# Patient Record
Sex: Male | Born: 1960 | Race: Black or African American | Hispanic: No
Health system: Midwestern US, Community
[De-identification: ages and names within clinical notes are randomized; demographics above are authoritative.]

## PROBLEM LIST (undated history)

## (undated) ENCOUNTER — Emergency Department (HOSPITAL_COMMUNITY): Admission: EM | Payer: Medicare HMO

## (undated) DIAGNOSIS — R1032 Left lower quadrant pain: Secondary | ICD-10-CM

## (undated) DIAGNOSIS — R109 Unspecified abdominal pain: Secondary | ICD-10-CM

## (undated) DIAGNOSIS — M79604 Pain in right leg: Secondary | ICD-10-CM

## (undated) DIAGNOSIS — F32A Depression, unspecified: Secondary | ICD-10-CM

## (undated) DIAGNOSIS — F419 Anxiety disorder, unspecified: Secondary | ICD-10-CM

## (undated) DIAGNOSIS — L299 Pruritus, unspecified: Secondary | ICD-10-CM

## (undated) DIAGNOSIS — M1990 Unspecified osteoarthritis, unspecified site: Secondary | ICD-10-CM

## (undated) DIAGNOSIS — J45909 Unspecified asthma, uncomplicated: Secondary | ICD-10-CM

## (undated) DIAGNOSIS — F329 Major depressive disorder, single episode, unspecified: Secondary | ICD-10-CM

## (undated) DIAGNOSIS — M79676 Pain in unspecified toe(s): Secondary | ICD-10-CM

## (undated) DIAGNOSIS — S83249A Other tear of medial meniscus, current injury, unspecified knee, initial encounter: Secondary | ICD-10-CM

## (undated) DIAGNOSIS — B351 Tinea unguium: Secondary | ICD-10-CM

## (undated) DIAGNOSIS — F17200 Nicotine dependence, unspecified, uncomplicated: Secondary | ICD-10-CM

## (undated) HISTORY — DX: Nicotine dependence, unspecified, uncomplicated: F17.200

## (undated) HISTORY — DX: Pruritus, unspecified: L29.9

## (undated) HISTORY — DX: Pain in unspecified toe(s): M79.676

## (undated) HISTORY — DX: Tinea unguium: B35.1

## (undated) HISTORY — PX: HERNIA REPAIR: SHX51

## (undated) HISTORY — DX: Unspecified asthma, uncomplicated: J45.909

---
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
## 2015-01-14 ENCOUNTER — Inpatient Hospital Stay: Admit: 2015-01-14

## 2015-01-16 LAB — PSA, DIAGNOSTIC (PROSTATE SPECIFIC AG): Prostate Specific Ag: 0.5 ng/mL (ref 0.0–4.0)

## 2015-11-22 ENCOUNTER — Emergency Department

## 2015-11-22 ENCOUNTER — Inpatient Hospital Stay
Admit: 2015-11-22 | Discharge: 2015-11-22 | Disposition: A | Discharge status: Home / Self Care | Payer: MEDICARE | Attending: Emergency Medicine

## 2015-11-22 ENCOUNTER — Emergency Department: Admit: 2015-11-22 | Discharge: 2015-11-22 | Payer: MEDICARE

## 2015-11-22 DIAGNOSIS — M25561 Pain in right knee: Secondary | ICD-10-CM

## 2015-11-22 MED ORDER — OXYCODONE-ACETAMINOPHEN 5 MG-325 MG TAB
5-325 mg | ORAL_TABLET | Freq: Four times a day (QID) | ORAL | 0 refills | Status: DC | PRN
Start: 2015-11-22 — End: 2016-06-07

## 2015-11-22 NOTE — ED Provider Notes (Signed)
HPI Comments: Jeffrey Ibarra, 55 y.o. male with no significant PMHx, presents ambulatory to [facility] ED with cc of right knee pain x 1 week. Pt believes he had an insect bite earlier as it is swollen and red. Pt states the pain has since migrated towards the posterior side of the knee. Pt notes the pain is exacerbated with movement specifically bending and walking. Pt denies any recent injury. Patient specifically denies fever, chills, nausea, vomiting, chest pain, cough, or SOB.     PCP: None    Social history significant for: + Tobacco, - EtOH, - Illicit Drug Use    There are no other complaints, changes, or physical findings at this time.     The history is provided by the patient. No language interpreter was used.        No past medical history on file.    No past surgical history on file.      No family history on file.    Social History     Social History   ??? Marital status: SINGLE     Spouse name: N/A   ??? Number of children: N/A   ??? Years of education: N/A     Occupational History   ??? Not on file.     Social History Main Topics   ??? Smoking status: Not on file   ??? Smokeless tobacco: Not on file   ??? Alcohol use Not on file   ??? Drug use: Not on file   ??? Sexual activity: Not on file     Other Topics Concern   ??? Not on file     Social History Narrative         ALLERGIES: Bactrim [sulfamethoprim]    Review of Systems   Constitutional: Negative for chills and fever.   HENT: Negative for congestion, rhinorrhea and sore throat.    Respiratory: Negative for cough and shortness of breath.    Cardiovascular: Negative for chest pain and palpitations.   Gastrointestinal: Negative for abdominal pain, diarrhea, nausea and vomiting.   Genitourinary: Negative for dysuria and hematuria.   Musculoskeletal: Positive for arthralgias. Negative for back pain, neck pain and neck stiffness.   Skin: Negative for rash and wound.   Neurological: Negative for dizziness and headaches.    Psychiatric/Behavioral: Negative for agitation and confusion.       Vitals:    11/22/15 1338   BP: 139/89   Pulse: 68   Resp: 16   Temp: 97.6 ??F (36.4 ??C)   SpO2: 99%   Weight: 92.8 kg (204 lb 9.4 oz)   Height: 5\' 11"  (1.803 m)            Physical Exam   Constitutional: He is oriented to person, place, and time. He appears well-developed and well-nourished. No distress.   HENT:   Head: Normocephalic and atraumatic.   Right Ear: External ear normal.   Left Ear: External ear normal.   Nose: Nose normal.   Mouth/Throat: Oropharynx is clear and moist. No oropharyngeal exudate.   Eyes: Conjunctivae and EOM are normal. Pupils are equal, round, and reactive to light. Right eye exhibits no discharge. Left eye exhibits no discharge. No scleral icterus.   Neck: Normal range of motion. Neck supple. No JVD present. No tracheal deviation present.   Cardiovascular: Normal rate, regular rhythm, normal heart sounds and intact distal pulses.  Exam reveals no gallop and no friction rub.    No murmur heard.  Pulmonary/Chest: Effort normal and  breath sounds normal. No respiratory distress. He has no wheezes. He has no rales. He exhibits no tenderness.   Abdominal: Soft. Bowel sounds are normal. He exhibits no distension and no mass. There is no tenderness. There is no rebound and no guarding.   Musculoskeletal: He exhibits no edema.        Right knee: He exhibits decreased range of motion, swelling and erythema. He exhibits no deformity. Tenderness found.   Lymphadenopathy:     He has no cervical adenopathy.   Neurological: He is alert and oriented to person, place, and time. He has normal reflexes. No cranial nerve deficit. He exhibits normal muscle tone. Coordination normal.   Skin: Skin is warm and dry. He is not diaphoretic.   Psychiatric: He has a normal mood and affect. His behavior is normal. Judgment and thought content normal.   Nursing note and vitals reviewed.       MDM  Number of Diagnoses or Management Options   Diagnosis management comments:     DDx: sprain, strain, insect bite, baker's cyst       Amount and/or Complexity of Data Reviewed  Tests in the radiology section of CPT??: ordered and reviewed  Review and summarize past medical records: yes    Patient Progress  Patient progress: stable    ED Course       Procedures    IMAGING RESULTS:  DUPLEX LOWER EXT VENOUS RIGHT   Final Result   INDICATION:  Right leg swelling and pain, assess for DVT.  ??  COMPARISON:  No old study.  ??  FINDINGS:  Duplex venous Doppler examination was performed from the right  inguinal ligament to the proximal calf.    Deep venous structures were well imaged and appeared compressible throughout.    Both wave form and color flow analysis demonstrated normal flow patterns.    Augmentation was demonstrable.    ??  ??  IMPRESSION  IMPRESSION:  No deep venous thrombosis detected.  ??     IMPRESSION:  1. Leg pain, central, right    2. Acute pain of right knee        PLAN:  1.   Current Discharge Medication List      START taking these medications    Details   oxyCODONE-acetaminophen (PERCOCET) 5-325 mg per tablet Take 1 Tab by mouth every six (6) hours as needed for Pain. Max Daily Amount: 4 Tabs.  Qty: 10 Tab, Refills: 0           2.   Follow-up Information     Follow up With Details Comments Contact Info    Janece Canterburyobert D Wills, MD In 2 days As needed [address]  [address]  [place] [id number]  [phone]          Return to ED if worse     Discharge Note:  3:23 PM   The pt is ready for discharge. The pt's signs, symptoms, diagnosis, and discharge instructions have been discussed and pt has conveyed their understanding. The pt is to follow up as recommended or return to ER should their symptoms worsen. Plan has been discussed and pt is in agreement.    This note is prepared by Viann ShoveSarah Valkovci, acting as a Neurosurgeoncribe for [employer] EnergyPA-C Naveah Brave.    PA-C Greig CastillaAndrew Vani Gunner: The scribe's documentation has been prepared  under my direction and personally reviewed by me in its entirety. I confirm that the notes above accurately reflects all work, treatment, procedures,  and medical decision making performed by me.

## 2015-11-22 NOTE — ED Notes (Signed)
Pt received discharge instructions from PA and verbalized understanding. Pt ambulatory to exit.

## 2015-11-22 NOTE — ED Notes (Signed)
The patient was given discharge instructions and questions were answered. Patient is in no apparent distress at time of discharge.   Patient ambulatory with steady gait, knee immobilizer in place.

## 2015-11-22 NOTE — ED Notes (Signed)
Patient resting in bed in no apparent distress. Call bell in reach, bed in low locked position.

## 2015-12-20 ENCOUNTER — Emergency Department: Admit: 2015-12-20 | Payer: MEDICARE

## 2015-12-20 ENCOUNTER — Inpatient Hospital Stay
Admit: 2015-12-20 | Discharge: 2015-12-21 | Disposition: A | Discharge status: Home / Self Care | Payer: MEDICARE | Attending: Emergency Medicine

## 2015-12-20 DIAGNOSIS — J209 Acute bronchitis, unspecified: Secondary | ICD-10-CM

## 2015-12-20 MED ORDER — ALBUTEROL SULFATE 0.083 % (0.83 MG/ML) SOLN FOR INHALATION
2.5 mg /3 mL (0.083 %) | RESPIRATORY_TRACT | Status: AC
Start: 2015-12-20 — End: 2015-12-20
  Administered 2015-12-21: via RESPIRATORY_TRACT

## 2015-12-20 MED ORDER — ACETAMINOPHEN 325 MG TABLET
325 mg | ORAL | Status: AC
Start: 2015-12-20 — End: 2015-12-20
  Administered 2015-12-21: via ORAL

## 2015-12-20 MED FILL — ALBUTEROL SULFATE 0.083 % (0.83 MG/ML) SOLN FOR INHALATION: 2.5 mg /3 mL (0.083 %) | RESPIRATORY_TRACT | Qty: 1

## 2015-12-20 MED FILL — TYLENOL 325 MG TABLET: 325 mg | ORAL | Qty: 2

## 2015-12-20 NOTE — ED Notes (Signed)
Report given to Mary Catherine, RN.

## 2015-12-20 NOTE — ED Triage Notes (Signed)
Pt arrives from home c/o headache, chest congestion, sore throat, and posterior neck pain.  Pt reports he was bit multiple times on Friday by a mosquito and is now worried he has [place].

## 2015-12-20 NOTE — ED Notes (Signed)
Discharge instructions given and reviewed with pt. Pt. verbalized understanding. Steady gait on discharge.

## 2015-12-20 NOTE — ED Notes (Signed)
Assumed care of patient.  In NAD.  Reports generalized body aches, sore throat, fever, chills, headache.  Medicated as ordered.  Labs drawn and sent.  RT at bedside to administer neb.  Call bell in reach. Blanket previously provided.  Lights dimmed.  Male companion at bedside

## 2015-12-20 NOTE — ED Provider Notes (Signed)
HPI Comments: 55 y.o. male with past medical history significant for asthma who presents from home with chief complaint of generalized myalgias. Patient reportedly started to have wheezing, sore throat, cough with yellow production, rhinorrhea, sneezing, sore throat, and body aches yesterday evening. He states that his cough worsens upon exertion. Since then, patient also reports having a fever, headache, and "eye burning". Pt states that he was bit by a mosquito several times on Friday (2 days ago). He denies any recent sick contacts or recent travel. There are no other acute medical concerns at this time.    Social hx: everyday smoker, no EtOH use, no drug use  PCP: None    Note written by Jaclyn PrimeMounikasai Talari, Scribe, as dictated by Raynald KempJoseph P Shanquita Ronning, MD 7:29 PM      The history is provided by the patient. No language interpreter was used.        Past Medical History:   Diagnosis Date   ??? Asthma        Past Surgical History:   Procedure Laterality Date   ??? ABDOMEN SURGERY PROC UNLISTED      hernia         History reviewed. No pertinent family history.    Social History     Social History   ??? Marital status: SINGLE     Spouse name: N/A   ??? Number of children: N/A   ??? Years of education: N/A     Occupational History   ??? Not on file.     Social History Main Topics   ??? Smoking status: Current Every Day Smoker   ??? Smokeless tobacco: Never Used   ??? Alcohol use No   ??? Drug use: No   ??? Sexual activity: Not on file     Other Topics Concern   ??? Not on file     Social History Narrative   ??? No narrative on file         ALLERGIES: Bactrim [sulfamethoprim]    Review of Systems   Constitutional: Positive for fever. Negative for appetite change and chills.   HENT: Positive for rhinorrhea, sneezing and sore throat. Negative for trouble swallowing.    Eyes: Positive for pain. Negative for photophobia.   Respiratory: Positive for cough. Negative for shortness of breath.    Cardiovascular: Negative for chest pain and palpitations.    Gastrointestinal: Negative for abdominal pain, nausea and vomiting.   Genitourinary: Negative for dysuria, frequency and hematuria.   Musculoskeletal: Positive for arthralgias and myalgias.   Neurological: Positive for headaches. Negative for dizziness, syncope and weakness.   Psychiatric/Behavioral: Negative for behavioral problems. The patient is not nervous/anxious.    All other systems reviewed and are negative.      Vitals:    12/20/15 1802   BP: (!) 161/93   Pulse: 95   Resp: 18   Temp: 98 ??F (36.7 ??C)   SpO2: 98%   Weight: 92.1 kg (203 lb)   Height: 5\' 11"  (1.803 m)            Physical Exam   Constitutional: He appears well-developed and well-nourished.   HENT:   Head: Normocephalic and atraumatic.   Mouth/Throat: Posterior oropharyngeal erythema present.   Eyes: EOM are normal. Pupils are equal, round, and reactive to light.   Neck: Normal range of motion. Neck supple.   Cardiovascular: Normal rate, regular rhythm, normal heart sounds and intact distal pulses.  Exam reveals no gallop and no friction rub.  No murmur heard.  Pulmonary/Chest: Effort normal. No respiratory distress. He has wheezes (mild, diffuse, expiratory). He has no rales.   Abdominal: Soft. There is no tenderness. There is no rebound.   Musculoskeletal: Normal range of motion. He exhibits no tenderness.   Neurological: He is alert. No cranial nerve deficit.   Motor; symmetric   Skin: No erythema.   Psychiatric: He has a normal mood and affect. His behavior is normal.   Nursing note and vitals reviewed.  Note written by Jaclyn PrimeMounikasai Talari, Scribe, as dictated by Raynald KempJoseph P Trinda Harlacher, MD 7:29 PM    MDM  ED Course       Procedures

## 2015-12-21 LAB — CBC WITH AUTOMATED DIFF
ABS. BASOPHILS: 0 10*3/uL (ref 0.0–0.1)
ABS. EOSINOPHILS: 0.2 10*3/uL (ref 0.0–0.4)
ABS. LYMPHOCYTES: 1.5 10*3/uL (ref 0.8–3.5)
ABS. MONOCYTES: 0.7 10*3/uL (ref 0.0–1.0)
ABS. NEUTROPHILS: 10.1 10*3/uL — ABNORMAL HIGH (ref 1.8–8.0)
BASOPHILS: 0 % (ref 0–1)
EOSINOPHILS: 2 % (ref 0–7)
HCT: 40 % (ref 36.6–50.3)
HGB: 13.9 g/dL (ref 12.1–17.0)
LYMPHOCYTES: 12 % (ref 12–49)
MCH: 32.2 PG (ref 26.0–34.0)
MCHC: 34.8 g/dL (ref 30.0–36.5)
MCV: 92.6 FL (ref 80.0–99.0)
MONOCYTES: 6 % (ref 5–13)
NEUTROPHILS: 80 % — ABNORMAL HIGH (ref 32–75)
PLATELET: 279 10*3/uL (ref 150–400)
RBC: 4.32 M/uL (ref 4.10–5.70)
RDW: 13.3 % (ref 11.5–14.5)
WBC: 12.5 10*3/uL — ABNORMAL HIGH (ref 4.1–11.1)

## 2015-12-21 LAB — METABOLIC PANEL, COMPREHENSIVE
A-G Ratio: 0.7 — ABNORMAL LOW (ref 1.1–2.2)
ALT (SGPT): 25 U/L (ref 12–78)
AST (SGOT): 9 U/L — ABNORMAL LOW (ref 15–37)
Albumin: 3.4 g/dL — ABNORMAL LOW (ref 3.5–5.0)
Alk. phosphatase: 73 U/L (ref 45–117)
Anion gap: 7 mmol/L (ref 5–15)
BUN/Creatinine ratio: 4 — ABNORMAL LOW (ref 12–20)
BUN: 5 MG/DL — ABNORMAL LOW (ref 6–20)
Bilirubin, total: 0.8 MG/DL (ref 0.2–1.0)
CO2: 26 mmol/L (ref 21–32)
Calcium: 8.6 MG/DL (ref 8.5–10.1)
Chloride: 105 mmol/L (ref 97–108)
Creatinine: 1.16 MG/DL (ref 0.70–1.30)
GFR est AA: >60 mL/min/{1.73_m2} (ref >60)
GFR est non-AA: >60 mL/min/{1.73_m2} (ref >60)
Globulin: 4.8 g/dL — ABNORMAL HIGH (ref 2.0–4.0)
Glucose: 83 mg/dL (ref 65–100)
Potassium: 3.6 mmol/L (ref 3.5–5.1)
Protein, total: 8.2 g/dL (ref 6.4–8.2)
Sodium: 138 mmol/L (ref 136–145)

## 2015-12-21 LAB — INFLUENZA A & B AG (RAPID TEST)
Influenza A Antigen: NEGATIVE
Influenza B Antigen: NEGATIVE

## 2015-12-21 LAB — POC GROUP A STREP: Group A strep (POC): NEGATIVE

## 2015-12-21 MED ORDER — PREDNISONE 20 MG TAB
20 mg | ORAL_TABLET | Freq: Two times a day (BID) | ORAL | 0 refills | Status: AC
Start: 2015-12-21 — End: 2015-12-27

## 2015-12-21 MED ORDER — AZITHROMYCIN 250 MG TAB
250 mg | ORAL_TABLET | ORAL | 0 refills | Status: AC
Start: 2015-12-21 — End: 2015-12-25

## 2015-12-22 LAB — CULTURE, THROAT: Culture result:: NORMAL

## 2016-02-21 ENCOUNTER — Inpatient Hospital Stay
Admit: 2016-02-21 | Discharge: 2016-02-21 | Disposition: A | Discharge status: Home / Self Care | Payer: MEDICARE | Attending: Emergency Medicine

## 2016-02-21 DIAGNOSIS — N4889 Other specified disorders of penis: Secondary | ICD-10-CM

## 2016-02-21 LAB — METABOLIC PANEL, COMPREHENSIVE
A-G Ratio: 0.9 — ABNORMAL LOW (ref 1.1–2.2)
ALT (SGPT): 44 U/L (ref 12–78)
AST (SGOT): 25 U/L (ref 15–37)
Albumin: 3.7 g/dL (ref 3.5–5.0)
Alk. phosphatase: 73 U/L (ref 45–117)
Anion gap: 7 mmol/L (ref 5–15)
BUN/Creatinine ratio: 7 — ABNORMAL LOW (ref 12–20)
BUN: 9 MG/DL (ref 6–20)
Bilirubin, total: 0.6 MG/DL (ref 0.2–1.0)
CO2: 27 mmol/L (ref 21–32)
Calcium: 8.5 MG/DL (ref 8.5–10.1)
Chloride: 106 mmol/L (ref 97–108)
Creatinine: 1.21 MG/DL (ref 0.70–1.30)
GFR est AA: >60 mL/min/{1.73_m2} (ref >60)
GFR est non-AA: >60 mL/min/{1.73_m2} (ref >60)
Globulin: 4.1 g/dL — ABNORMAL HIGH (ref 2.0–4.0)
Glucose: 84 mg/dL (ref 65–100)
Potassium: 3.8 mmol/L (ref 3.5–5.1)
Protein, total: 7.8 g/dL (ref 6.4–8.2)
Sodium: 140 mmol/L (ref 136–145)

## 2016-02-21 LAB — CBC WITH AUTOMATED DIFF
ABS. BASOPHILS: 0 10*3/uL (ref 0.0–0.1)
ABS. EOSINOPHILS: 0.2 10*3/uL (ref 0.0–0.4)
ABS. LYMPHOCYTES: 2.2 10*3/uL (ref 0.8–3.5)
ABS. MONOCYTES: 0.4 10*3/uL (ref 0.0–1.0)
ABS. NEUTROPHILS: 4.3 10*3/uL (ref 1.8–8.0)
BASOPHILS: 0 % (ref 0–1)
EOSINOPHILS: 2 % (ref 0–7)
HCT: 40.9 % (ref 36.6–50.3)
HGB: 14.3 g/dL (ref 12.1–17.0)
LYMPHOCYTES: 31 % (ref 12–49)
MCH: 32.5 PG (ref 26.0–34.0)
MCHC: 35 g/dL (ref 30.0–36.5)
MCV: 93 FL (ref 80.0–99.0)
MONOCYTES: 6 % (ref 5–13)
NEUTROPHILS: 61 % (ref 32–75)
PLATELET: 328 10*3/uL (ref 150–400)
RBC: 4.4 M/uL (ref 4.10–5.70)
RDW: 13 % (ref 11.5–14.5)
WBC: 7.1 10*3/uL (ref 4.1–11.1)

## 2016-02-21 LAB — URINALYSIS W/MICROSCOPIC
Bacteria: NEGATIVE /hpf
Bilirubin: NEGATIVE
Blood: NEGATIVE
Glucose: NEGATIVE mg/dL
Ketone: NEGATIVE mg/dL
Leukocyte Esterase: NEGATIVE
Nitrites: NEGATIVE
Protein: NEGATIVE mg/dL
Specific gravity: 1.03 (ref 1.003–1.030)
Urobilinogen: 0.2 EU/dL (ref 0.2–1.0)
pH (UA): 5.5 (ref 5.0–8.0)

## 2016-02-21 LAB — URINE CULTURE HOLD SAMPLE

## 2016-02-21 LAB — LIPASE: Lipase: 81 U/L (ref 73–393)

## 2016-02-21 MED ORDER — CEFTRIAXONE 250 MG SOLUTION FOR INJECTION
250 mg | INTRAMUSCULAR | Status: AC
Start: 2016-02-21 — End: 2016-02-21
  Administered 2016-02-21: 22:00:00 via INTRAMUSCULAR

## 2016-02-21 MED ORDER — SODIUM CHLORIDE 0.9% BOLUS IV
0.9 % | Freq: Once | INTRAVENOUS | Status: AC
Start: 2016-02-21 — End: 2016-02-21
  Administered 2016-02-21: 22:00:00 via INTRAVENOUS

## 2016-02-21 MED ORDER — KETOROLAC TROMETHAMINE 30 MG/ML INJECTION
30 mg/mL (1 mL) | INTRAMUSCULAR | Status: AC
Start: 2016-02-21 — End: 2016-02-21
  Administered 2016-02-21: 22:00:00 via INTRAVENOUS

## 2016-02-21 MED ORDER — AZITHROMYCIN 250 MG TAB
250 mg | ORAL | Status: AC
Start: 2016-02-21 — End: 2016-02-21
  Administered 2016-02-21: 22:00:00 via ORAL

## 2016-02-21 MED ORDER — FAMOTIDINE (PF) 20 MG/2 ML IV
20 mg/2 mL | INTRAVENOUS | Status: AC
Start: 2016-02-21 — End: 2016-02-21
  Administered 2016-02-21: 22:00:00 via INTRAVENOUS

## 2016-02-21 MED FILL — KETOROLAC TROMETHAMINE 30 MG/ML INJECTION: 30 mg/mL (1 mL) | INTRAMUSCULAR | Qty: 1

## 2016-02-21 MED FILL — AZITHROMYCIN 250 MG TAB: 250 mg | ORAL | Qty: 4

## 2016-02-21 MED FILL — SODIUM CHLORIDE 0.9 % IV: INTRAVENOUS | Qty: 1000

## 2016-02-21 MED FILL — CEFTRIAXONE 250 MG SOLUTION FOR INJECTION: 250 mg | INTRAMUSCULAR | Qty: 250

## 2016-02-21 MED FILL — FAMOTIDINE (PF) 20 MG/2 ML IV: 20 mg/2 mL | INTRAVENOUS | Qty: 2

## 2016-02-21 NOTE — ED Triage Notes (Signed)
Patient reports upper abdominal pain, sharp for 3 days with burning, tingling sensation to his penis. He says his fiancee was treated for UTI with similar symptoms.

## 2016-02-21 NOTE — ED Provider Notes (Signed)
HPI Comments: 55 y.o. male with past medical history significant for asthma who presents from home with chief complaint of penile pain.  The pt c/o sharp upper abdominal pain for 3 days and dysuria. His fiance has similar sx.  The pt explained "we broke up for a little while and I think she has had unprotected sex with other men."  He denies any urethral discharge or testicular pain.  He states that he has had trichomonas in the past and this is similar to what he experienced then.  He also states that he is concerned because he has had abdominal pain since the onset of his discomfort.  There are no other acute medical concerns at this time.    Social hx: current smoker, no EtOH use  PCP: None    Note written by Gerald LeitzSujith S. Narayanan, Scribe, as dictated by Cletis Mediaavid Brieanne Mignone, PA  5:56 PM      The history is provided by the patient. No language interpreter was used.        Past Medical History:   Diagnosis Date   ??? Asthma        Past Surgical History:   Procedure Laterality Date   ??? ABDOMEN SURGERY PROC UNLISTED      hernia         History reviewed. No pertinent family history.    Social History     Social History   ??? Marital status: SINGLE     Spouse name: N/A   ??? Number of children: N/A   ??? Years of education: N/A     Occupational History   ??? Not on file.     Social History Main Topics   ??? Smoking status: Current Every Day Smoker   ??? Smokeless tobacco: Never Used   ??? Alcohol use No   ??? Drug use: No   ??? Sexual activity: Not on file     Other Topics Concern   ??? Not on file     Social History Narrative         ALLERGIES: Bactrim [sulfamethoprim]    Review of Systems   Constitutional: Negative for chills, diaphoresis and fever.   HENT: Negative for congestion, postnasal drip, rhinorrhea and sore throat.    Eyes: Negative for photophobia, discharge, redness and visual disturbance.   Respiratory: Negative for cough, chest tightness, shortness of breath and wheezing.     Cardiovascular: Negative for chest pain, palpitations and leg swelling.   Gastrointestinal: Negative for abdominal distention, abdominal pain, blood in stool, constipation, diarrhea, nausea and vomiting.   Genitourinary: Positive for penile pain. Negative for difficulty urinating, dysuria, frequency, hematuria and urgency.   Musculoskeletal: Negative for arthralgias, back pain, joint swelling and myalgias.   Skin: Negative for color change and rash.   Neurological: Negative for dizziness, speech difficulty, weakness, light-headedness, numbness and headaches.   Psychiatric/Behavioral: Negative for confusion. The patient is not nervous/anxious.    All other systems reviewed and are negative.      Vitals:    02/21/16 1608   BP: (!) 179/107   Pulse: 66   Resp: 16   Temp: 98.2 ??F (36.8 ??C)   SpO2: 97%   Weight: 97.1 kg (214 lb)   Height: 5\' 11"  (1.803 m)            Physical Exam   Constitutional: He is oriented to person, place, and time. He appears well-developed and well-nourished. No distress.   HENT:   Head: Normocephalic and atraumatic.  Right Ear: External ear normal.   Left Ear: External ear normal.   Nose: Nose normal.   Mouth/Throat: Oropharynx is clear and moist.   Eyes: Conjunctivae and EOM are normal. Pupils are equal, round, and reactive to light. No scleral icterus.   Neck: Normal range of motion. Neck supple. No JVD present. No tracheal deviation present. No thyromegaly present.   Cardiovascular: Normal rate, regular rhythm and normal heart sounds.  Exam reveals no gallop and no friction rub.    No murmur heard.  Pulmonary/Chest: Effort normal and breath sounds normal. No respiratory distress. He has no wheezes. He has no rales. He exhibits no tenderness.   Abdominal: Soft. Bowel sounds are normal. He exhibits no distension and no mass. There is no tenderness. There is no rebound and no guarding.   Genitourinary:   Genitourinary Comments: Pt is circumcised.  No urethral dc.  Cremasteric  reflex is present bilaterally.     Musculoskeletal: Normal range of motion. He exhibits no edema or tenderness.   Lymphadenopathy:     He has no cervical adenopathy.   Neurological: He is alert and oriented to person, place, and time. He has normal strength. He displays no atrophy and no tremor. No cranial nerve deficit. He exhibits normal muscle tone. Coordination and gait normal.   Skin: Skin is warm and dry. No rash noted. He is not diaphoretic. No erythema.   Psychiatric: He has a normal mood and affect. His behavior is normal. Judgment and thought content normal.   Nursing note and vitals reviewed.       MDM  Number of Diagnoses or Management Options  Penile pain:   Diagnosis management comments: UA and abdominal labs unremarkable.  Covered with abx and advised to follow up with family doctor for further evaluation of symptoms.  Reviewed treatment plan with attending and they agree.  Eliezer Champagneavid B Alexius Hangartner, PA-C    ED Course       Procedures

## 2016-02-21 NOTE — ED Notes (Signed)
I have reviewed discharge instructions with the patient.  The patient verbalized understanding. Time allotted for questions. VSS. Pt ambulatory out of unit with spouse.

## 2016-02-22 LAB — CHLAMYDIA/GC PCR
Chlamydia amplified: NEGATIVE
N. gonorrhea, amplified: NEGATIVE

## 2016-06-07 ENCOUNTER — Emergency Department: Admit: 2016-06-08 | Payer: MEDICARE

## 2016-06-07 DIAGNOSIS — B349 Viral infection, unspecified: Secondary | ICD-10-CM

## 2016-06-07 NOTE — ED Notes (Signed)
Pt given discharge instructions, patient education, and follow-up information. Pt states understanding - all questions answered. Pt discharged to home in private vehicle, ambulatory. Pt A&Ox4, RA, with pain controlled.

## 2016-06-07 NOTE — ED Triage Notes (Signed)
TRIAGE NOTE: Patient arrived from home with c/o fever, generalized body aches and nausea since last week. Patient states, "my fiance had this virus last week. I was starting to feel better over the weekend but now I feel like it is starting all over again." patient has not taken anything for the pain/fever

## 2016-06-07 NOTE — ED Provider Notes (Signed)
HPI Comments: 56 y.o. male with past medical history significant for asthma, high cholesterol, who presents ambulatory to the ED accompanied by wife, for evaluation of flu-like symptoms including high fevers, sore throat, generalized body aches, and cough. Pt reports that his wife was seen in the ED last Tuesday (05/31/2016) for a flu like symptoms and diagnosed with "a viral illness". The following day, pt developed symptoms of his own including fatigue and generalized body aches, and he stayed home from work as a result. Then on Thursday 06/02/2016 pt developed F/C, and he reports Tmax at home of 105F taken my mouth. Pt continued to stay home from work and felt "a little better" over the weekend, but he does not feel like his symptoms are resolving. Wife reports that her symptoms only lasted 2-3 days, but pt's symptoms are still present. He notes that when he woke up this morning he felt nauseous and was sweating. Did not measure his temperature this morning, but he felt like he had a fever. He states that he still has a sore throat and a cough, but both of those things are better than they were. Still complains of 6/10 generalized body aches, which are greatest in the back. Additionally c/o a HA and "a little abdominal pain", but denies any vomiting, diarrhea, or constipation. Pt notes that he received his influenza vaccination this year, but he has not taken any OTC medications today for treatment of his symptoms. He specifically denies any ear pain, CP, SOB, dysuria, or hematuria. There are no other acute medical concerns at this time.    Social hx: Positive for Tobacco use (current every day smoker); Negative for EtOH use; Negative for Illicit Drug Abuse    PCP: None     Note written by Guadlupe Spanish.Guy Sandifer, Scribe, as dictated by Kerryann Allaire C Foust-Ward, PA-C 9:01 PM     The history is provided by the patient and the spouse. No language interpreter was used.        Past Medical History:   Diagnosis Date    ??? Asthma    ??? High cholesterol        Past Surgical History:   Procedure Laterality Date   ??? ABDOMEN SURGERY PROC UNLISTED      hernia         History reviewed. No pertinent family history.    Social History     Social History   ??? Marital status: SINGLE     Spouse name: N/A   ??? Number of children: N/A   ??? Years of education: N/A     Occupational History   ??? Not on file.     Social History Main Topics   ??? Smoking status: Current Every Day Smoker   ??? Smokeless tobacco: Never Used   ??? Alcohol use No   ??? Drug use: No   ??? Sexual activity: Not on file     Other Topics Concern   ??? Not on file     Social History Narrative         ALLERGIES: Bactrim [sulfamethoprim]    Review of Systems   Constitutional: Positive for chills, diaphoresis, fatigue and fever.   HENT: Positive for sore throat. Negative for congestion and ear pain.    Eyes: Negative.  Negative for pain and itching.   Respiratory: Positive for cough. Negative for chest tightness and shortness of breath.    Cardiovascular: Negative for chest pain and palpitations.   Gastrointestinal: Positive for abdominal pain and nausea.  Negative for abdominal distention, constipation, diarrhea and vomiting.   Genitourinary: Negative for dysuria and hematuria.   Musculoskeletal: Positive for back pain and myalgias. Negative for joint swelling, neck pain and neck stiffness.   Neurological: Positive for headaches. Negative for numbness.   All other systems reviewed and are negative.      Vitals:    06/07/16 2019 06/07/16 2206   BP: (!) 158/94 155/86   Pulse: 67 72   Resp: 20 18   Temp: 97.5 ??F (36.4 ??C) 97.8 ??F (36.6 ??C)   SpO2: 99% 100%   Weight: 97.3 kg (214 lb 9.6 oz)    Height: 5\' 11"  (1.803 m)             Physical Exam   Constitutional: He is oriented to person, place, and time. He appears well-developed and well-nourished. No distress.   Well appearing AAM in NAD   HENT:   Head: Normocephalic and atraumatic.   Right Ear: Tympanic membrane, external ear and ear canal normal.    Left Ear: Tympanic membrane, external ear and ear canal normal.   Nose: Nose normal. No mucosal edema or rhinorrhea.   Mouth/Throat: Uvula is midline, oropharynx is clear and moist and mucous membranes are normal. No oropharyngeal exudate, posterior oropharyngeal edema or posterior oropharyngeal erythema.   Eyes: Conjunctivae and EOM are normal. Pupils are equal, round, and reactive to light. Right eye exhibits no discharge. Left eye exhibits no discharge.   Neck: Normal range of motion. Neck supple.   Cardiovascular: Normal rate, regular rhythm and normal heart sounds.    Pulmonary/Chest: Effort normal and breath sounds normal. He has no wheezes. He has no rales.   Abdominal: Soft. Bowel sounds are normal. He exhibits no distension. There is no tenderness. There is no guarding.   Musculoskeletal: Normal range of motion.   Lymphadenopathy:     He has no cervical adenopathy.   Neurological: He is alert and oriented to person, place, and time. No cranial nerve deficit.   Skin: Skin is warm and dry. He is not diaphoretic.   Psychiatric: He has a normal mood and affect. His behavior is normal.   Nursing note and vitals reviewed.       MDM  Number of Diagnoses or Management Options  Viral syndrome:   Diagnosis management comments: 56 year old [place] American male with complaint of generalized myalgias with associated cough, sore throat, nausea and chills. Family member with influenza like illness last week. Patient currently appears well-hydrated and nontoxic with stable vitals although mildly hypertensive. Afebrile without history of reported antipyretic therapy. Prior history patient reports improvement in symptoms. Suspect viral etiology like flu. Lungs clear history of asthma. Will check x-ray chest and rapid flu.       Amount and/or Complexity of Data Reviewed  Clinical lab tests: ordered and reviewed  Tests in the radiology section of CPT??: ordered and reviewed   Independent visualization of images, tracings, or specimens: yes          ED Course       Procedures      Progress note    Rapid flu-. Chest xray clear. Suspect end of viral illness. Benicio Manna C Foust-Ward, [place]    Patient's results have been reviewed with them.  Patient and/or family have verbally conveyed their understanding and agreement of the patient's signs, symptoms, diagnosis, treatment and prognosis and additionally agree to follow up as recommended or return to the Emergency Room should their condition change prior to follow-up.  Discharge instructions have also been provided to the patient with some educational information regarding their diagnosis as well a list of reasons why they would want to return to the ER prior to their follow-up appointment should their condition change. Balbina Depace C Foust-Ward, [place]    A/P  Viral syndrome: Rest, push fluids. Tylenol and/or ibuprofen if needed for fever and/or pain. Return for any new or worsening. Follow-up with regular doctor. Saatvik Thielman C DarrowFoust-Ward, [place]

## 2016-06-08 ENCOUNTER — Inpatient Hospital Stay
Admit: 2016-06-08 | Discharge: 2016-06-08 | Disposition: A | Discharge status: Home / Self Care | Payer: MEDICARE | Attending: Emergency Medicine

## 2016-06-08 LAB — INFLUENZA A & B AG (RAPID TEST)
Influenza A Antigen: NEGATIVE
Influenza B Antigen: NEGATIVE

## 2016-06-14 ENCOUNTER — Inpatient Hospital Stay
Admit: 2016-06-14 | Discharge: 2016-06-14 | Disposition: A | Discharge status: Home / Self Care | Payer: MEDICARE | Attending: Emergency Medicine

## 2016-06-14 ENCOUNTER — Emergency Department: Admit: 2016-06-14 | Payer: MEDICARE

## 2016-06-14 DIAGNOSIS — S93491A Sprain of other ligament of right ankle, initial encounter: Secondary | ICD-10-CM

## 2016-06-14 MED ORDER — IBUPROFEN 600 MG TAB
600 mg | ORAL_TABLET | Freq: Three times a day (TID) | ORAL | 0 refills | Status: DC | PRN
Start: 2016-06-14 — End: 2017-07-10

## 2016-06-14 MED ORDER — TRAMADOL 50 MG TAB
50 mg | ORAL_TABLET | Freq: Four times a day (QID) | ORAL | 0 refills | Status: DC | PRN
Start: 2016-06-14 — End: 2017-07-10

## 2016-06-14 NOTE — ED Triage Notes (Signed)
Chief Complaint: ankle pain   Patient states was walking around a lot tonight.  Right > left ankle pain swelling right ankle  Onset couple of hours  Pt arrived via EMS

## 2016-06-14 NOTE — ED Notes (Signed)
Pt discharged with written instructions and prescriptions at this time.  Pt verbalizes understanding and all questions were answered.  Discharged by Dr.O'Bier, in stable condition, with a friend.

## 2016-06-14 NOTE — ED Notes (Signed)
Dr. O'Bier at bedside at this time.
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
## 2016-08-16 ENCOUNTER — Emergency Department: Admit: 2016-08-17 | Payer: MEDICARE

## 2016-08-16 ENCOUNTER — Inpatient Hospital Stay: Admit: 2016-08-16 | Discharge: 2016-08-17 | Payer: MEDICARE | Attending: Emergency Medicine

## 2016-08-16 ENCOUNTER — Ambulatory Visit: Payer: MEDICARE

## 2016-08-16 DIAGNOSIS — M5442 Lumbago with sciatica, left side: Secondary | ICD-10-CM

## 2016-08-16 NOTE — ED Provider Notes (Shared)
EMERGENCY DEPARTMENT HISTORY AND PHYSICAL EXAM      Date: (Not on file)  Patient Name: Jeffrey Ibarra     PROVIDER IN TRIAGE NOTE:  5:21 PM  PA-C Lorriane ShireLauren E. Pagliassotti has evaluated the patient as the Provider in Triage (PIT) for back pain. The vital signs and the triage nurse assessment have been reviewed. The patient and any available family have been advised that the appropriate studies have been ordered to initiate the work up based on the clinical presentation during the assessment.  The pt has been requested to contact the triage nurse or PIT immediately if they experiences any changes in their condition during this brief waiting period.  Written by Delora Fuelonnie Truong, ED Scribe, as dictated by Ardelle AntonPA-C Lauren E. Pagliassotti.      History of Presenting Illness     Chief Complaint   Patient presents with   ??? Back Pain     Pt c/o L side lowback pain radiating down left leg after doing yard work 1 week ago.       History Provided By: {BJYNW:29562}{SAHPI:24168}    HPI: Jeffrey Ibarra, 56 y.o. male with PMHx significant for ***, presents *** to the ED with cc of ***    Chief Complaint: ***  Duration: *** {CDIDURATION:24169}  Timing:  {CDITIMING:24170}  Location: ***  Quality: {CDIQUALITY:24171}  Severity: {CDISEVERITY:24172}  Modifying Factors: ***  Associated Symptoms: {CDIASSOCIATEDSYMPTOMS:24173}    ***Include Elements OR use the "no elements" blurb as needed.***    There are no other complaints, changes, or physical findings at this time.    PCP: None    Current Outpatient Prescriptions   Medication Sig Dispense Refill   ??? OTHER Take 2 Tabs by mouth daily. Herbal supplement for arthritis     ??? traMADol (ULTRAM) 50 mg tablet Take 1 Tab by mouth every six (6) hours as needed for Pain. Max Daily Amount: 200 mg. 20 Tab 0   ??? ibuprofen (MOTRIN) 600 mg tablet Take 1 Tab by mouth every eight (8) hours as needed for Pain. 30 Tab 0   ??? FENOFIBRATE NANOCRYSTALLIZED (TRICOR PO) Take  by mouth.         Past History     Past Medical History:   Past Medical History:   Diagnosis Date   ??? Asthma    ??? High cholesterol        Past Surgical History:  Past Surgical History:   Procedure Laterality Date   ??? ABDOMEN SURGERY PROC UNLISTED      hernia       Family History:  No family history on file.    Social History:  Social History   Substance Use Topics   ??? Smoking status: Current Every Day Smoker     Packs/day: 0.10   ??? Smokeless tobacco: Never Used   ??? Alcohol use No       Allergies:  Allergies   Allergen Reactions   ??? Bactrim [Sulfamethoprim] Itching         Review of Systems   Review of Systems    Physical Exam   Physical Exam      Diagnostic Study Results     Labs -   No results found for this or any previous visit (from the past 12 hour(s)).    Radiologic Studies -   No orders to display     CT Results  (Last 48 hours)    None        CXR Results  (  Last 48 hours)    None            Medical Decision Making   I am the first provider for this patient.    I reviewed the vital signs, available nursing notes, past medical history, past surgical history, family history and social history.    Vital Signs-Reviewed the patient's vital signs.  Patient Vitals for the past 12 hrs:   Temp Resp BP SpO2   08/16/16 1721 (P) 98 ??F (36.7 ??C) (P) 14 (!) (P) 134/95 (P) 98 %       Pulse Oximetry Analysis - ***% on ***    Cardiac Monitor:   Rate: *** bpm  Rhythm: {Rhythm including paccardio:30870}     EKG interpretation: (Preliminary)  Rhythm: {BS[facility]SI ED EKG RHYTHM:19338}; and {[facility] ED EKG BJYN:82956}. Rate (approx.): ***; Axis: {[facility] ED EKG AXIS DEVIATION:19344}; PR interval: {[facility] ED EKG P WAVE:19340}; QRS interval: {[facility] ED EKG QRS INTERVAL:19341}; ST/T wave: {[facility] ED EKG ST/T OZHY:86578}; Other findings: {[facility] ED EKG OTHER IONGEXBM:84132}.  Written by ***, ED Scribe, as dictated by ***.    Records Reviewed: {CDIRECORDSREVIEWED:24175}    Provider Notes (Medical Decision Making):   ***    ED Course:   Initial assessment performed. The patients presenting problems have been  discussed, and they are in agreement with the care plan formulated and outlined with them.  I have encouraged them to ask questions as they arise throughout their visit.    ***    Critical Care Time:   ***    Disposition:  ***    PLAN:  1.   Current Discharge Medication List        2.   Follow-up Information     None        Return to ED if worse     Diagnosis     Clinical Impression: No diagnosis found.    Attestations:    ***

## 2016-08-16 NOTE — ED Notes (Signed)
Pt c/o increased lower back pain, reassurance provided and vital signs re-evaluated.

## 2016-08-16 NOTE — ED Notes (Addendum)
1st attempt, no answer.

## 2016-08-16 NOTE — ED Triage Notes (Signed)
TRIAGE NOTE:  Patient arrives with c/o lower left back pain that radiates down left leg for past week.

## 2016-08-16 NOTE — ED Notes (Signed)
Discharge paperwork given by provider, patient ambulated out of the department with a steady gait.

## 2016-08-16 NOTE — ED Provider Notes (Signed)
HPI Comments: 56 y.o. male with past medical history significant for asthma and high cholesterol who presents to the ED with chief complaint of back pain. Patient reports constant left lower back pain that radiates down his left leg and into his left foot with onset ~1 week ago; reports his pain became worse "today." Patient reports his pain is worse with standing and walking; reports it is "slightly" better with laying down and resting. Patient reports difficulty walking due to his pain. Patient also reports urinary frequency with recent onset. Patient denies a history of similar symptoms. Patient denies any injuries to his back. Patient reports a history of high cholesterol; reports being on medications for it. Patient reports a history of a left meniscus tear. 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 Patient denies any mid back pain, hematuria, and dysuria. There are no other acute medical concerns at this time.    PCP: None    Note written by Bethann HumbleNaveen Chandra Kotha, Scribe, as dictated by Raynald KempJoseph P Sagan Maselli, MD 8:38 PM     The history is provided by the patient.        Past Medical History:   Diagnosis Date   ??? Asthma    ??? High cholesterol        Past Surgical History:   Procedure Laterality Date   ??? ABDOMEN SURGERY PROC UNLISTED      hernia         No family history on file.    Social History     Social History   ??? Marital status: MARRIED     Spouse name: N/A   ??? Number of children: N/A   ??? Years of education: N/A     Occupational History   ??? Not on file.     Social History Main Topics   ??? Smoking status: Current Every Day Smoker     Packs/day: 0.10   ??? Smokeless tobacco: Never Used   ??? Alcohol use No   ??? Drug use: No   ??? Sexual activity: Not on file     Other Topics Concern   ??? Not on file     Social History Narrative         ALLERGIES: Bactrim [sulfamethoprim]    Review of Systems   Constitutional: Negative for appetite change, chills and fever.    HENT: Negative for rhinorrhea, sore throat and trouble swallowing.    Eyes: Negative for photophobia.   Respiratory: Negative for cough and shortness of breath.    Cardiovascular: Negative for chest pain and palpitations.   Gastrointestinal: Negative for abdominal pain, nausea and vomiting.   Genitourinary: Negative for dysuria, frequency and hematuria.   Musculoskeletal: Positive for back pain and myalgias. Negative for arthralgias.   Neurological: Negative for dizziness, syncope and weakness.   Psychiatric/Behavioral: Negative for behavioral problems. The patient is not nervous/anxious.    All other systems reviewed and are negative.      Vitals:    08/16/16 2022   BP: (!) 159/91   Pulse: 78   Resp: 18   Temp: 98.2 ??F (36.8 ??C)   SpO2: 97%   Weight: 98.4 kg (217 lb)            Physical Exam   Constitutional: He appears well-developed and well-nourished.   HENT:   Head: Normocephalic and atraumatic.   Mouth/Throat: Oropharynx is clear and moist.   Eyes: EOM are normal. Pupils are equal, round,  and reactive to light.   Neck: Normal range of motion. Neck supple.   Cardiovascular: Normal rate, regular rhythm, normal heart sounds and intact distal pulses.  Exam reveals no gallop and no friction rub.    No murmur heard.  Pulmonary/Chest: Effort normal. No respiratory distress. He has no wheezes. He has no rales.   Abdominal: Soft. There is no tenderness. There is no rebound.   Musculoskeletal: Normal range of motion. He exhibits tenderness.   Tenderness over the left flank and sacroiliac area.   Neurological: He is alert. No cranial nerve deficit.   Motor; symmetric   Skin: No erythema.   Psychiatric: He has a normal mood and affect. His behavior is normal.   Nursing note and vitals reviewed.  Note written by Bethann Humble, Scribe, as dictated by Raynald Kemp, MD 8:48 PM      MDM      ED Course       Procedures

## 2016-08-17 ENCOUNTER — Inpatient Hospital Stay
Admit: 2016-08-17 | Discharge: 2016-08-17 | Disposition: A | Discharge status: Home / Self Care | Payer: MEDICARE | Attending: Emergency Medicine

## 2016-08-17 LAB — URINALYSIS W/ REFLEX CULTURE
Bacteria: NEGATIVE /hpf
Bilirubin: NEGATIVE
Blood: NEGATIVE
Glucose: NEGATIVE mg/dL
Ketone: NEGATIVE mg/dL
Nitrites: NEGATIVE
Protein: NEGATIVE mg/dL
Specific gravity: 1.018 (ref 1.003–1.030)
Urobilinogen: 0.2 EU/dL (ref 0.2–1.0)
pH (UA): 6.5 (ref 5.0–8.0)

## 2016-08-17 MED ORDER — CEPHALEXIN 500 MG CAP
500 mg | ORAL_CAPSULE | Freq: Three times a day (TID) | ORAL | 0 refills | Status: AC
Start: 2016-08-17 — End: 2016-08-23

## 2016-08-17 MED ORDER — HYDROCODONE-ACETAMINOPHEN 7.5 MG-325 MG TAB
ORAL | Status: AC
Start: 2016-08-17 — End: 2016-08-16
  Administered 2016-08-17: 01:00:00 via ORAL

## 2016-08-17 MED ORDER — PREDNISONE 20 MG TAB
20 mg | ORAL_TABLET | Freq: Every day | ORAL | 0 refills | Status: AC
Start: 2016-08-17 — End: 2016-08-21

## 2016-08-17 MED ORDER — HYDROCODONE-ACETAMINOPHEN 7.5 MG-325 MG TAB
ORAL_TABLET | Freq: Four times a day (QID) | ORAL | 0 refills | Status: DC | PRN
Start: 2016-08-17 — End: 2017-07-10

## 2016-08-17 MED FILL — HYDROCODONE-ACETAMINOPHEN 7.5 MG-325 MG TAB: ORAL | Qty: 1

## 2016-08-18 LAB — CULTURE, URINE
Colonies Counted: <1000
Colony Count: <1000
Culture result:: NO GROWTH
Culture: NO GROWTH

## 2016-08-24 ENCOUNTER — Encounter: Attending: Nurse Practitioner

## 2016-09-07 ENCOUNTER — Emergency Department

## 2016-09-07 ENCOUNTER — Inpatient Hospital Stay
Admit: 2016-09-07 | Discharge: 2016-09-07 | Disposition: A | Discharge status: Home / Self Care | Payer: MEDICARE | Attending: Emergency Medicine

## 2016-09-07 ENCOUNTER — Emergency Department: Admit: 2016-09-07 | Payer: MEDICARE

## 2016-09-07 DIAGNOSIS — R1032 Left lower quadrant pain: Secondary | ICD-10-CM

## 2016-09-07 LAB — URINALYSIS W/MICROSCOPIC
Bacteria: NEGATIVE /hpf
Bilirubin: NEGATIVE
Blood: NEGATIVE
Glucose: NEGATIVE mg/dL
Ketone: NEGATIVE mg/dL
Leukocyte Esterase: NEGATIVE
Nitrites: NEGATIVE
Protein: NEGATIVE mg/dL
Specific gravity: 1.01 (ref 1.003–1.030)
Urobilinogen: 0.2 EU/dL (ref 0.2–1.0)
pH (UA): 8 (ref 5.0–8.0)

## 2016-09-07 LAB — CBC WITH AUTOMATED DIFF
ABS. BASOPHILS: 0 10*3/uL (ref 0.0–0.1)
ABS. EOSINOPHILS: 0.2 10*3/uL (ref 0.0–0.4)
ABS. IMM. GRANS.: 0 10*3/uL (ref 0.00–0.04)
ABS. LYMPHOCYTES: 2.1 10*3/uL (ref 0.8–3.5)
ABS. MONOCYTES: 0.6 10*3/uL (ref 0.0–1.0)
ABS. NEUTROPHILS: 6.8 10*3/uL (ref 1.8–8.0)
ABSOLUTE NRBC: 0 10*3/uL (ref 0.00–0.01)
BASOPHILS: 0 % (ref 0–1)
EOSINOPHILS: 2 % (ref 0–7)
HCT: 40.5 % (ref 36.6–50.3)
HGB: 14.2 g/dL (ref 12.1–17.0)
IMMATURE GRANULOCYTES: 0 % (ref 0.0–0.5)
LYMPHOCYTES: 22 % (ref 12–49)
MCH: 32.3 PG (ref 26.0–34.0)
MCHC: 35.1 g/dL (ref 30.0–36.5)
MCV: 92.3 FL (ref 80.0–99.0)
MONOCYTES: 7 % (ref 5–13)
MPV: 10.4 FL (ref 8.9–12.9)
NEUTROPHILS: 69 % (ref 32–75)
NRBC: 0 PER 100 WBC
PLATELET: 293 10*3/uL (ref 150–400)
RBC: 4.39 M/uL (ref 4.10–5.70)
RDW: 12.9 % (ref 11.5–14.5)
WBC: 9.8 10*3/uL (ref 4.1–11.1)

## 2016-09-07 LAB — LIPASE: Lipase: 118 U/L (ref 73–393)

## 2016-09-07 LAB — METABOLIC PANEL, COMPREHENSIVE
A-G Ratio: 0.9 — ABNORMAL LOW (ref 1.1–2.2)
ALT (SGPT): 27 U/L (ref 12–78)
AST (SGOT): 19 U/L (ref 15–37)
Albumin: 3.5 g/dL (ref 3.5–5.0)
Alk. phosphatase: 67 U/L (ref 45–117)
Anion gap: 8 mmol/L (ref 5–15)
BUN/Creatinine ratio: 11 — ABNORMAL LOW (ref 12–20)
BUN: 12 MG/DL (ref 6–20)
Bilirubin, total: 0.7 MG/DL (ref 0.2–1.0)
CO2: 25 mmol/L (ref 21–32)
Calcium: 8.1 MG/DL — ABNORMAL LOW (ref 8.5–10.1)
Chloride: 105 mmol/L (ref 97–108)
Creatinine: 1.13 MG/DL (ref 0.70–1.30)
GFR est AA: >60 mL/min/{1.73_m2} (ref >60)
GFR est non-AA: >60 mL/min/{1.73_m2} (ref >60)
Globulin: 4 g/dL (ref 2.0–4.0)
Glucose: 113 mg/dL — ABNORMAL HIGH (ref 65–100)
Potassium: 3.5 mmol/L (ref 3.5–5.1)
Protein, total: 7.5 g/dL (ref 6.4–8.2)
Sodium: 138 mmol/L (ref 136–145)

## 2016-09-07 LAB — TROPONIN I
Troponin-I, Qt.: <0.05 ng/mL (ref <0.05)
Troponin-I, Qt.: <0.05 ng/mL (ref <0.05)

## 2016-09-07 MED ORDER — FENTANYL CITRATE (PF) 50 MCG/ML IJ SOLN
50 mcg/mL | INTRAMUSCULAR | Status: AC
Start: 2016-09-07 — End: 2016-09-07
  Administered 2016-09-07: 12:00:00 via INTRAVENOUS

## 2016-09-07 MED ORDER — SODIUM CHLORIDE 0.9 % IJ SYRG
Freq: Once | INTRAMUSCULAR | Status: AC
Start: 2016-09-07 — End: 2016-09-07
  Administered 2016-09-07: 13:00:00 via INTRAVENOUS

## 2016-09-07 MED ORDER — SODIUM CHLORIDE 0.9% BOLUS IV
0.9 % | Freq: Once | INTRAVENOUS | Status: AC
Start: 2016-09-07 — End: 2016-09-07
  Administered 2016-09-07: 13:00:00 via INTRAVENOUS

## 2016-09-07 MED ORDER — ONDANSETRON 4 MG TAB, RAPID DISSOLVE
4 mg | ORAL_TABLET | Freq: Three times a day (TID) | ORAL | 1 refills | Status: DC | PRN
Start: 2016-09-07 — End: 2017-07-23

## 2016-09-07 MED ORDER — ONDANSETRON (PF) 4 MG/2 ML INJECTION
4 mg/2 mL | INTRAMUSCULAR | Status: AC
Start: 2016-09-07 — End: 2016-09-07
  Administered 2016-09-07: 12:00:00 via INTRAVENOUS

## 2016-09-07 MED ORDER — IOPAMIDOL 76 % IV SOLN
370 mg iodine /mL (76 %) | Freq: Once | INTRAVENOUS | Status: AC
Start: 2016-09-07 — End: 2016-09-07
  Administered 2016-09-07: 13:00:00 via INTRAVENOUS

## 2016-09-07 MED ORDER — SODIUM CHLORIDE 0.9% BOLUS IV
0.9 % | Freq: Once | INTRAVENOUS | Status: AC
Start: 2016-09-07 — End: 2016-09-07
  Administered 2016-09-07: 12:00:00 via INTRAVENOUS

## 2016-09-07 MED ORDER — IOPAMIDOL 76 % IV SOLN
370 mg iodine /mL (76 %) | INTRAVENOUS | Status: DC
Start: 2016-09-07 — End: 2016-09-07

## 2016-09-07 MED FILL — ONDANSETRON (PF) 4 MG/2 ML INJECTION: 4 mg/2 mL | INTRAMUSCULAR | Qty: 2

## 2016-09-07 MED FILL — NORMAL SALINE FLUSH 0.9 % INJECTION SYRINGE: INTRAMUSCULAR | Qty: 10

## 2016-09-07 MED FILL — FENTANYL CITRATE (PF) 50 MCG/ML IJ SOLN: 50 mcg/mL | INTRAMUSCULAR | Qty: 2

## 2016-09-07 MED FILL — ISOVUE-370  76 % INTRAVENOUS SOLUTION: 370 mg iodine /mL (76 %) | INTRAVENOUS | Qty: 100

## 2016-09-07 MED FILL — SODIUM CHLORIDE 0.9 % IV: INTRAVENOUS | Qty: 100

## 2016-09-07 MED FILL — SODIUM CHLORIDE 0.9 % IV: INTRAVENOUS | Qty: 1000

## 2016-09-07 NOTE — Consults (Signed)
General Surgery ER Consultation    Admit Date: 09/07/2016  Reason for Consultation: LLQ abd pain and R adrenal ademoma    HPI:  Jeffrey Ibarra is a 56 y.o. male w/ hx of umbilical hernia repair in 2014 and ? Ventral hernia repair in 2010 presents to the ED w/ c/o LLQ pain and vomiting that started at 11pm last night.  Pt had dinner at [employer] is didn't feel well after eating.  The LLQ pain started and he forced himself to vomit.  He had the pain and nausea all night.  It would radiate around to his back as well.  He had one loose stool and denies passing flatus this am.  He denies f/c/s, or other prior surgeries other than listed above.  No ulcer hx either.  He says he quit smoking 4 months ago.    CT scan  14 mm right adrenal adenoma. Small cyst in the spleen and right kidney. No acute  abnormality        There are no active problems to display for this patient.    Past Medical History:   Diagnosis Date   ??? Abdominal wall hernia    ??? Asthma    ??? High cholesterol       Past Surgical History:   Procedure Laterality Date   ??? ABDOMEN SURGERY PROC UNLISTED      hernia      Social History   Substance Use Topics   ??? Smoking status: Current Every Day Smoker     Packs/day: 0.10   ??? Smokeless tobacco: Never Used   ??? Alcohol use No      History reviewed. No pertinent family history.   Prior to Admission medications    Medication Sig Start Date End Date Taking? Authorizing Provider   HYDROcodone-acetaminophen (NORCO) 7.5-325 mg per tablet Take 1 Tab by mouth every six (6) hours as needed for Pain. Max Daily Amount: 4 Tabs. 08/16/16  Yes Misty Stanley, MD   OTHER Take 2 Tabs by mouth daily. Herbal supplement for arthritis    Phys Other, MD   traMADol (ULTRAM) 50 mg tablet Take 1 Tab by mouth every six (6) hours as needed for Pain. Max Daily Amount: 200 mg. 06/14/16   April N O'Bier, MD   ibuprofen (MOTRIN) 600 mg tablet Take 1 Tab by mouth every eight (8) hours as needed for Pain. 06/14/16   April N O'Bier, MD   FENOFIBRATE  NANOCRYSTALLIZED (TRICOR PO) Take  by mouth.    Phys Other, MD     Allergies   Allergen Reactions   ??? Bactrim [Sulfamethoprim] Itching   ??? Ibuprofen Hives          Subjective:     Review of Systems:    A comprehensive review of systems was negative except for that written in the History of Present Illness.       Objective:     Blood pressure 135/59, pulse 65, temperature 97.8 ??F (36.6 ??C), resp. rate 18, height 5' 11"  (1.803 m), weight 216 lb 12.8 oz (98.3 kg), SpO2 99 %.  Recent Results (from the past 24 hour(s))   CBC WITH AUTOMATED DIFF    Collection Time: 09/07/16  7:27 AM   Result Value Ref Range    WBC 9.8 4.1 - 11.1 K/uL    RBC 4.39 4.10 - 5.70 M/uL    HGB 14.2 12.1 - 17.0 g/dL    HCT 40.5 36.6 - 50.3 %  MCV 92.3 80.0 - 99.0 FL    MCH 32.3 26.0 - 34.0 PG    MCHC 35.1 30.0 - 36.5 g/dL    RDW 12.9 11.5 - 14.5 %    PLATELET 293 150 - 400 K/uL    MPV 10.4 8.9 - 12.9 FL    NRBC 0.0 0 PER 100 WBC    ABSOLUTE NRBC 0.00 0.00 - 0.01 K/uL    NEUTROPHILS 69 32 - 75 %    LYMPHOCYTES 22 12 - 49 %    MONOCYTES 7 5 - 13 %    EOSINOPHILS 2 0 - 7 %    BASOPHILS 0 0 - 1 %    IMMATURE GRANULOCYTES 0 0.0 - 0.5 %    ABS. NEUTROPHILS 6.8 1.8 - 8.0 K/UL    ABS. LYMPHOCYTES 2.1 0.8 - 3.5 K/UL    ABS. MONOCYTES 0.6 0.0 - 1.0 K/UL    ABS. EOSINOPHILS 0.2 0.0 - 0.4 K/UL    ABS. BASOPHILS 0.0 0.0 - 0.1 K/UL    ABS. IMM. GRANS. 0.0 0.00 - 0.04 K/UL    DF AUTOMATED     METABOLIC PANEL, COMPREHENSIVE    Collection Time: 09/07/16  7:27 AM   Result Value Ref Range    Sodium 138 136 - 145 mmol/L    Potassium 3.5 3.5 - 5.1 mmol/L    Chloride 105 97 - 108 mmol/L    CO2 25 21 - 32 mmol/L    Anion gap 8 5 - 15 mmol/L    Glucose 113 (H) 65 - 100 mg/dL    BUN 12 6 - 20 MG/DL    Creatinine 1.13 0.70 - 1.30 MG/DL    BUN/Creatinine ratio 11 (L) 12 - 20      GFR est AA >60 >60 ml/min/1.58m    GFR est non-AA >60 >60 ml/min/1.786m   Calcium 8.1 (L) 8.5 - 10.1 MG/DL    Bilirubin, total 0.7 0.2 - 1.0 MG/DL    ALT (SGPT) 27 12 - 78 U/L    AST (SGOT) 19  15 - 37 U/L    Alk. phosphatase 67 45 - 117 U/L    Protein, total 7.5 6.4 - 8.2 g/dL    Albumin 3.5 3.5 - 5.0 g/dL    Globulin 4.0 2.0 - 4.0 g/dL    A-G Ratio 0.9 (L) 1.1 - 2.2     LIPASE    Collection Time: 09/07/16  7:27 AM   Result Value Ref Range    Lipase 118 73 - 393 U/L   TROPONIN I    Collection Time: 09/07/16  7:27 AM   Result Value Ref Range    Troponin-I, Qt. <0.05 <0.05 ng/mL   EKG, 12 LEAD, INITIAL    Collection Time: 09/07/16  7:33 AM   Result Value Ref Range    Ventricular Rate 57 BPM    Atrial Rate 57 BPM    P-R Interval 160 ms    QRS Duration 80 ms    Q-T Interval 418 ms    QTC Calculation (Bezet) 406 ms    Calculated P Axis 34 degrees    Calculated R Axis 53 degrees    Calculated T Axis 59 degrees    Diagnosis       Sinus bradycardia  Nonspecific T wave abnormality  No previous ECGs available     URINALYSIS W/MICROSCOPIC    Collection Time: 09/07/16  9:12 AM   Result Value Ref Range    Color YELLOW/STRAW  Appearance CLEAR CLEAR      Specific gravity 1.010 1.003 - 1.030      pH (UA) 8.0 5.0 - 8.0      Protein NEGATIVE  NEG mg/dL    Glucose NEGATIVE  NEG mg/dL    Ketone NEGATIVE  NEG mg/dL    Bilirubin NEGATIVE  NEG      Blood NEGATIVE  NEG      Urobilinogen 0.2 0.2 - 1.0 EU/dL    Nitrites NEGATIVE  NEG      Leukocyte Esterase NEGATIVE  NEG      WBC 5-10 0 - 4 /hpf    RBC 0-5 0 - 5 /hpf    Epithelial cells FEW FEW /lpf    Bacteria NEGATIVE  NEG /hpf     _____________________  Physical Exam:     General:  Alert, cooperative, no distress, appears stated age.   Eyes:   Sclera clear.   Throat: Lips, mucosa, and tongue normal.   Neck: Supple, symmetrical, trachea midline.   Lungs:   Clear to auscultation bilaterally.   Heart:  Regular rate and rhythm.   Abdomen:   Soft, diminished BS, TTP LUQ, LLQ, and epigastric area; No masses,  No organomegaly.   Extremities: Extremities normal, atraumatic, no cyanosis or edema.   Skin: Skin color, texture, turgor normal. No rashes or lesions.           Assessment:    Active Problems:    * No active hospital problems. *          Plan:     Will have Dr Claudie Leach eval CT  May need GI consult    Total time spent with patient: 25 minutes.    Signed By: Allean Found, NP     September 07, 2016    Pt seen and examined  Began having abdominal pain after eating at a restaurant. He was unsure if the food may hjave been bad.  The pain is mainly in the left flank moving along the costal margin up to the umbilical region  Tried vomiting with little relief. He did have a loose bm.   Gen- Alert in NAD  Abd- soft with some tenderness. Along the left costal margin to the iliac crest./  No rebound  Labs Normal  CT normal   Probable gastroenteritis.  Supportive care   No need for operative intervention at this time.

## 2016-09-07 NOTE — Consults (Addendum)
General Surgery ER Consultation    Admit Date: 09/07/2016  Reason for Consultation: LLQ abd pain and R adrenal ademoma    HPI:  Jeffrey Ibarra is a 56 y.o. male w/ hx of umbilical hernia repair in 2014 and ? Ventral hernia repair in 2010 presents to the ED w/ c/o LLQ pain and vomiting that started at 11pm last night.  Pt had dinner at [employer] is didn't feel well after eating.  The LLQ pain started and he forced himself to vomit.  He had the pain and nausea all night.  It would radiate around to his back as well.  He had one loose stool and denies passing flatus this am.  He denies f/c/s, or other prior surgeries other than listed above.  No ulcer hx either.  He says he quit smoking 4 months ago.    CT scan  14 mm right adrenal adenoma. Small cyst in the spleen and right kidney. No acute  abnormality        There are no active problems to display for this patient.    Past Medical History:   Diagnosis Date   ??? Abdominal wall hernia    ??? Asthma    ??? High cholesterol       Past Surgical History:   Procedure Laterality Date   ??? ABDOMEN SURGERY PROC UNLISTED      hernia      Social History   Substance Use Topics   ??? Smoking status: Current Every Day Smoker     Packs/day: 0.10   ??? Smokeless tobacco: Never Used   ??? Alcohol use No      History reviewed. No pertinent family history.   Prior to Admission medications    Medication Sig Start Date End Date Taking? Authorizing Provider   HYDROcodone-acetaminophen (NORCO) 7.5-325 mg per tablet Take 1 Tab by mouth every six (6) hours as needed for Pain. Max Daily Amount: 4 Tabs. 08/16/16  Yes Misty Stanley, MD   OTHER Take 2 Tabs by mouth daily. Herbal supplement for arthritis    Phys Other, MD   traMADol (ULTRAM) 50 mg tablet Take 1 Tab by mouth every six (6) hours as needed for Pain. Max Daily Amount: 200 mg. 06/14/16   April N O'Bier, MD   ibuprofen (MOTRIN) 600 mg tablet Take 1 Tab by mouth every eight (8) hours as needed for Pain. 06/14/16   April N O'Bier, MD    FENOFIBRATE NANOCRYSTALLIZED (TRICOR PO) Take  by mouth.    Phys Other, MD     Allergies   Allergen Reactions   ??? Bactrim [Sulfamethoprim] Itching   ??? Ibuprofen Hives          Subjective:     Review of Systems:    A comprehensive review of systems was negative except for that written in the History of Present Illness.       Objective:     Blood pressure 135/59, pulse 65, temperature 97.8 ??F (36.6 ??C), resp. rate 18, height 5' 11" (1.803 m), weight 216 lb 12.8 oz (98.3 kg), SpO2 99 %.  Recent Results (from the past 24 hour(s))   CBC WITH AUTOMATED DIFF    Collection Time: 09/07/16  7:27 AM   Result Value Ref Range    WBC 9.8 4.1 - 11.1 K/uL    RBC 4.39 4.10 - 5.70 M/uL    HGB 14.2 12.1 - 17.0 g/dL    HCT 40.5 36.6 - 50.3 %  MCV 92.3 80.0 - 99.0 FL    MCH 32.3 26.0 - 34.0 PG    MCHC 35.1 30.0 - 36.5 g/dL    RDW 12.9 11.5 - 14.5 %    PLATELET 293 150 - 400 K/uL    MPV 10.4 8.9 - 12.9 FL    NRBC 0.0 0 PER 100 WBC    ABSOLUTE NRBC 0.00 0.00 - 0.01 K/uL    NEUTROPHILS 69 32 - 75 %    LYMPHOCYTES 22 12 - 49 %    MONOCYTES 7 5 - 13 %    EOSINOPHILS 2 0 - 7 %    BASOPHILS 0 0 - 1 %    IMMATURE GRANULOCYTES 0 0.0 - 0.5 %    ABS. NEUTROPHILS 6.8 1.8 - 8.0 K/UL    ABS. LYMPHOCYTES 2.1 0.8 - 3.5 K/UL    ABS. MONOCYTES 0.6 0.0 - 1.0 K/UL    ABS. EOSINOPHILS 0.2 0.0 - 0.4 K/UL    ABS. BASOPHILS 0.0 0.0 - 0.1 K/UL    ABS. IMM. GRANS. 0.0 0.00 - 0.04 K/UL    DF AUTOMATED     METABOLIC PANEL, COMPREHENSIVE    Collection Time: 09/07/16  7:27 AM   Result Value Ref Range    Sodium 138 136 - 145 mmol/L    Potassium 3.5 3.5 - 5.1 mmol/L    Chloride 105 97 - 108 mmol/L    CO2 25 21 - 32 mmol/L    Anion gap 8 5 - 15 mmol/L    Glucose 113 (H) 65 - 100 mg/dL    BUN 12 6 - 20 MG/DL    Creatinine 1.13 0.70 - 1.30 MG/DL    BUN/Creatinine ratio 11 (L) 12 - 20      GFR est AA >60 >60 ml/min/1.4m    GFR est non-AA >60 >60 ml/min/1.737m   Calcium 8.1 (L) 8.5 - 10.1 MG/DL    Bilirubin, total 0.7 0.2 - 1.0 MG/DL    ALT (SGPT) 27 12 - 78 U/L     AST (SGOT) 19 15 - 37 U/L    Alk. phosphatase 67 45 - 117 U/L    Protein, total 7.5 6.4 - 8.2 g/dL    Albumin 3.5 3.5 - 5.0 g/dL    Globulin 4.0 2.0 - 4.0 g/dL    A-G Ratio 0.9 (L) 1.1 - 2.2     LIPASE    Collection Time: 09/07/16  7:27 AM   Result Value Ref Range    Lipase 118 73 - 393 U/L   TROPONIN I    Collection Time: 09/07/16  7:27 AM   Result Value Ref Range    Troponin-I, Qt. <0.05 <0.05 ng/mL   EKG, 12 LEAD, INITIAL    Collection Time: 09/07/16  7:33 AM   Result Value Ref Range    Ventricular Rate 57 BPM    Atrial Rate 57 BPM    P-R Interval 160 ms    QRS Duration 80 ms    Q-T Interval 418 ms    QTC Calculation (Bezet) 406 ms    Calculated P Axis 34 degrees    Calculated R Axis 53 degrees    Calculated T Axis 59 degrees    Diagnosis       Sinus bradycardia  Nonspecific T wave abnormality  No previous ECGs available     URINALYSIS W/MICROSCOPIC    Collection Time: 09/07/16  9:12 AM   Result Value Ref Range    Color YELLOW/STRAW  Appearance CLEAR CLEAR      Specific gravity 1.010 1.003 - 1.030      pH (UA) 8.0 5.0 - 8.0      Protein NEGATIVE  NEG mg/dL    Glucose NEGATIVE  NEG mg/dL    Ketone NEGATIVE  NEG mg/dL    Bilirubin NEGATIVE  NEG      Blood NEGATIVE  NEG      Urobilinogen 0.2 0.2 - 1.0 EU/dL    Nitrites NEGATIVE  NEG      Leukocyte Esterase NEGATIVE  NEG      WBC 5-10 0 - 4 /hpf    RBC 0-5 0 - 5 /hpf    Epithelial cells FEW FEW /lpf    Bacteria NEGATIVE  NEG /hpf     _____________________  Physical Exam:     General:  Alert, cooperative, no distress, appears stated age.   Eyes:   Sclera clear.   Throat: Lips, mucosa, and tongue normal.   Neck: Supple, symmetrical, trachea midline.   Lungs:   Clear to auscultation bilaterally.   Heart:  Regular rate and rhythm.   Abdomen:   Soft, diminished BS, TTP LUQ, LLQ, and epigastric area; No masses,  No organomegaly.   Extremities: Extremities normal, atraumatic, no cyanosis or edema.   Skin: Skin color, texture, turgor normal. No rashes or lesions.            Assessment:   Active Problems:    * No active hospital problems. *          Plan:     Will have Dr Claudie Leach eval CT  May need GI consult    Total time spent with patient: 25 minutes.    Signed By: Allean Found, NP     September 07, 2016    Pt seen and examined  Began having abdominal pain after eating at a restaurant. He was unsure if the food may hjave been bad.  The pain is mainly in the left flank moving along the costal margin up to the umbilical region  Tried vomiting with little relief. He did have a loose bm.   Gen- Alert in NAD  Abd- soft with some tenderness. Along the left costal margin to the iliac crest./  No rebound  Labs Normal  CT normal   Probable gastroenteritis.  Supportive care   No need for operative intervention at this time.

## 2016-09-07 NOTE — ED Notes (Signed)
56yo male Pt with PMH of abdominal hernia with corrective surgery and left knee surgery presents to ED for left sided abdominal pain, nausea,  Endorses not passing flatus, unable to burp, oriented to room and call bell, cardiac and continuous spO2 monitoring initiated, IV started, blood samples collected and sent to lab for analysis, EKG and chest Xray completed, plan of care reviewed, call bell in reach, side rails up x2, will continue to monitor.

## 2016-09-07 NOTE — Progress Notes (Signed)
Spiritual Care Assessment/Progress Note  [facility]      NAME: Jeffrey Ibarra      MRN: [id number]  AGE: 56 y.o. SEX: male  Religious Affiliation: Pentecostal   Language: English     09/07/2016     Total Time (in minutes): 10     [facility] through conversation with:         [x] Patient        [x]  Family    []  Friend(s)        Reason for Consult: Emergency Department visit     Spiritual beliefs: (Please include comment if needed)     [x]  Identifies with a faith tradition:     []  Supported by a faith community:      []  Claims no spiritual orientation:      []  Seeking spiritual identity:           []  Adheres to an individual form of spirituality:      []  Not able to assess:                     Identified resources for coping:      [x]  Prayer                               []  Music                  []  Guided Imagery     [x]  Family/friends                 []  Pet visits     []  Devotional reading                         []  Unknown     []  Other:                                               Interventions offered during this visit: (See comments for more details)    Patient Interventions: Prayer (actual), Prayer (assurance of), Affirmation of faith           Plan of Care:     [x]  Support spiritual and/or cultural needs    []  Support AMD and/or advance care planning process      []  Support grieving process   []  Coordinate Rites and/or Rituals    []  Coordination with community clergy   []  No spiritual needs identified at this time   []  Detailed Plan of Care below (See Comments)  []  Make referral to Music Therapy  []  Make referral to Pet Therapy     []  Make referral to Addiction services  []  [facility]  []  Make referral to Spiritual Care Partner  []  No future visits requested        []  Follow up visits as needed     Comments: Visited Jeffrey Ibarra in ED Room 11, PT was with his wife, PT was in good spirits, very soft spoken, stated he was not felling well and that he  was suffering with pain in his side. He stated that the hospital was treating him well, He also spoke of his faith. PT asked for prayer and we prayed and gave the prayer  of assurance. PT wife stated that the spiritual care unit is a wonderful addition to the hospital. I reassured them both if their was anything that they needed we are avaliable 24/7 they could call the Spiritual Care Unit at anytime.    Sallee Lange. Manson Passey.,MDiv  [address]???s [facility]

## 2016-09-07 NOTE — ED Notes (Signed)
[facility]

## 2016-09-07 NOTE — ED Notes (Signed)
Patient resting comfortably on stretcher with fiance at this time.  When asked about pain patient states he is still feeling good and he wants to remain clear headed.  Patient advised to let nurse know if his pain get worse so pain can stay under control.  Call bell within reach; will continue to monitor.

## 2016-09-07 NOTE — ED Notes (Signed)
I have reviewed discharge instructions with the patient.  The patient verbalized understanding. Patient ambulatory out of ED with fiance

## 2016-09-07 NOTE — ED Triage Notes (Signed)
Pt arrives from home with complaints of sharp LEFT sided abdominal pain and bloating since 10:30 pm last night. +vomiting +dizziness. Pt reports taking pepcid, baking soda, and "a pain pill" (possibly percocet) with no relief. Pt has hx of hernia repairs. Pt denies SOB at this time.

## 2016-09-07 NOTE — ED Provider Notes (Addendum)
HPI       [age]y M with hx of hernia repair here with left sided abd pain since last night around 10pm. Pain doesn't radiate. Abdomen feels bloated - couldn't get his pants buttoned today. Has some loose stool at onset but nothing since and not passing gas. (+) nausea and vomiting. No chest pain or trouble breathing. No fever. No urinary complaints. Having a little bit of lower L back pain. Nothing makes his pain better or worse. It doesn't radiate, but since the onset he is also having more diffuse lower abdominal pain. No rash. No fever.     Past Medical History:   Diagnosis Date   ??? Asthma    ??? High cholesterol        Past Surgical History:   Procedure Laterality Date   ??? ABDOMEN SURGERY PROC UNLISTED      hernia         No family history on file.    Social History     Social History   ??? Marital status: MARRIED     Spouse name: N/A   ??? Number of children: N/A   ??? Years of education: N/A     Occupational History   ??? Not on file.     Social History Main Topics   ??? Smoking status: Current Every Day Smoker     Packs/day: 0.10   ??? Smokeless tobacco: Never Used   ??? Alcohol use No   ??? Drug use: No   ??? Sexual activity: Not on file     Other Topics Concern   ??? Not on file     Social History Narrative         ALLERGIES: Bactrim [sulfamethoprim]    Review of Systems  Review of Systems   Constitutional: (-) weight loss.   HEENT: (-) stiff neck   Eyes: (-) discharge.   Respiratory: (-) for cough.    Cardiovascular: (-) syncope.   Gastrointestinal: (-) blood in stool.   Genitourinary: (-) hematuria.  Musculoskeletal: (-) myalgias.   Neurological: (-) seizure.   Skin: (-) petechiae  Lymph/Immunologic: (-) enlarged lymph nodes  All other systems reviewed and are negative.      Vitals:    09/07/16 0718   BP: 141/83   Pulse: 65   Resp: 18   Temp: 97.8 ??F (36.6 ??C)   SpO2: 99%   Weight: 98.3 kg (216 lb 12.8 oz)   Height: 5\' 11"  (1.803 m)            Physical Exam Nursing note and vitals reviewed.   Constitutional: oriented to person, place, and time. appears well-developed and well-nourished. No distress.  Head: Normocephalic and atraumatic. Sclera anicteric  Nose: No rhinorrhea  Mouth/Throat: Oropharynx is clear and moist. Pharynx normal  Eyes: Conjunctivae are normal. Pupils are equal, round, and reactive to light. Right eye exhibits no discharge. Left eye exhibits no discharge.  Neck: Painless normal range of motion. Neck supple. No LAD.  Cardiovascular: Normal rate, regular rhythm, normal heart sounds and intact distal pulses.  Exam reveals no gallop and no friction rub.  No murmur heard.  Pulmonary/Chest:  No respiratory distress. No wheezes. No rales. No rhonchi. No increased work of breathing. No accessory muscle use. Good air exchange throughout.  Abdominal: soft, tender to palpation in the LLQ, no rebound or guarding. No hepatosplenomegaly. Normal bowel sounds throughout.  Back: no tenderness to palpation, no deformities, no CVA tenderness  Extremities/Musculoskeletal: Normal range of motion. no tenderness.  No edema. Distal extremities are neurovasc intact.  Lymphadenopathy:   No adenopathy.   Neurological:  Alert and oriented to person, place, and time. Coordination normal. CN 2-12 intact. Motor and sensory function intact.  Skin: Skin is warm and dry. No rash noted. No pallor.       MDM 76y M here with abd pain. Plan for labs, ECG, CT abd/pelvis. Based on history, concern for infectious process vs mass vs obstruction.      ED Course       Procedures      ED EKG interpretation:  Rhythm: sinus bradycardia; and regular . Rate (approx.): 57; Axis: normal; P wave: normal; QRS interval: normal ; ST/T wave: non-specific changes;  This EKG was interpreted by Lemmie Evens, MD,ED Provider.    9:53 AM  CT shows R adrenal adenoma - not likely the cause of his pain. Rest of CT ok. General surgery seeing patient now as etiology of pain is unclear. Labs are reassuring.      11:25 AM   Seen by surgery - nothing further at this time. Will have him follow-up with GI for ongoing evaluation. He does report a hx of food intolerance in the past.

## 2016-09-07 NOTE — ED Notes (Signed)
Patient resting comfortably on stretcher and family member is at bedside.  Call bell within reach; will continue to monitor

## 2016-09-08 LAB — EKG, 12 LEAD, INITIAL
Atrial Rate: 57 {beats}/min
Calculated P Axis: 34 degrees
Calculated R Axis: 53 degrees
Calculated T Axis: 59 degrees
P-R Interval: 160 ms
Q-T Interval: 418 ms
QRS Duration: 80 ms
QTC Calculation (Bezet): 406 ms
Ventricular Rate: 57 {beats}/min

## 2016-09-08 LAB — EKG 12-LEAD
Atrial Rate: 57 {beats}/min
P Axis: 34 degrees
P-R Interval: 160 ms
Q-T Interval: 418 ms
QRS Duration: 80 ms
QTc Calculation (Bazett): 406 ms
R Axis: 53 degrees
T Axis: 59 degrees
Ventricular Rate: 57 {beats}/min

## 2016-10-10 ENCOUNTER — Emergency Department: Admit: 2016-10-10 | Payer: MEDICARE

## 2016-10-10 ENCOUNTER — Inpatient Hospital Stay
Admit: 2016-10-10 | Discharge: 2016-10-10 | Disposition: A | Discharge status: Home / Self Care | Payer: MEDICARE | Attending: Student in an Organized Health Care Education/Training Program

## 2016-10-10 DIAGNOSIS — R21 Rash and other nonspecific skin eruption: Secondary | ICD-10-CM

## 2016-10-10 LAB — CBC WITH AUTOMATED DIFF
ABS. BASOPHILS: 0 10*3/uL (ref 0.0–0.1)
ABS. EOSINOPHILS: 0.2 10*3/uL (ref 0.0–0.4)
ABS. IMM. GRANS.: 0 10*3/uL (ref 0.00–0.04)
ABS. LYMPHOCYTES: 1.8 10*3/uL (ref 0.8–3.5)
ABS. MONOCYTES: 0.6 10*3/uL (ref 0.0–1.0)
ABS. NEUTROPHILS: 3.3 10*3/uL (ref 1.8–8.0)
ABSOLUTE NRBC: 0 10*3/uL (ref 0.00–0.01)
BASOPHILS: 1 % (ref 0–1)
EOSINOPHILS: 3 % (ref 0–7)
HCT: 39.9 % (ref 36.6–50.3)
HGB: 13.6 g/dL (ref 12.1–17.0)
IMMATURE GRANULOCYTES: 0 % (ref 0.0–0.5)
LYMPHOCYTES: 31 % (ref 12–49)
MCH: 32.7 PG (ref 26.0–34.0)
MCHC: 34.1 g/dL (ref 30.0–36.5)
MCV: 95.9 FL (ref 80.0–99.0)
MONOCYTES: 10 % (ref 5–13)
MPV: 10.4 FL (ref 8.9–12.9)
NEUTROPHILS: 55 % (ref 32–75)
NRBC: 0 PER 100 WBC
PLATELET: 249 10*3/uL (ref 150–400)
RBC: 4.16 M/uL (ref 4.10–5.70)
RDW: 13.1 % (ref 11.5–14.5)
WBC: 6 10*3/uL (ref 4.1–11.1)

## 2016-10-10 LAB — EKG, 12 LEAD, INITIAL
Atrial Rate: 59 {beats}/min
Calculated P Axis: 70 degrees
Calculated R Axis: 57 degrees
Calculated T Axis: 41 degrees
P-R Interval: 150 ms
Q-T Interval: 414 ms
QRS Duration: 76 ms
QTC Calculation (Bezet): 409 ms
Ventricular Rate: 59 {beats}/min

## 2016-10-10 LAB — LIPASE: Lipase: 123 U/L (ref 73–393)

## 2016-10-10 LAB — METABOLIC PANEL, COMPREHENSIVE
A-G Ratio: 0.8 — ABNORMAL LOW (ref 1.1–2.2)
ALT (SGPT): 28 U/L (ref 12–78)
AST (SGOT): 19 U/L (ref 15–37)
Albumin: 3.3 g/dL — ABNORMAL LOW (ref 3.5–5.0)
Alk. phosphatase: 57 U/L (ref 45–117)
Anion gap: 9 mmol/L (ref 5–15)
BUN/Creatinine ratio: 8 — ABNORMAL LOW (ref 12–20)
BUN: 10 MG/DL (ref 6–20)
Bilirubin, total: 0.6 MG/DL (ref 0.2–1.0)
CO2: 25 mmol/L (ref 21–32)
Calcium: 8.7 MG/DL (ref 8.5–10.1)
Chloride: 107 mmol/L (ref 97–108)
Creatinine: 1.27 MG/DL (ref 0.70–1.30)
GFR est AA: >60 mL/min/{1.73_m2} (ref >60)
GFR est non-AA: 59 mL/min/{1.73_m2} — ABNORMAL LOW (ref >60)
Globulin: 4 g/dL (ref 2.0–4.0)
Glucose: 104 mg/dL — ABNORMAL HIGH (ref 65–100)
Potassium: 3.8 mmol/L (ref 3.5–5.1)
Protein, total: 7.3 g/dL (ref 6.4–8.2)
Sodium: 141 mmol/L (ref 136–145)

## 2016-10-10 LAB — TROPONIN I: Troponin-I, Qt.: <0.05 ng/mL (ref <0.05)

## 2016-10-10 LAB — EKG 12-LEAD
Atrial Rate: 59 {beats}/min
P Axis: 70 degrees
P-R Interval: 150 ms
Q-T Interval: 414 ms
QRS Duration: 76 ms
QTc Calculation (Bazett): 409 ms
R Axis: 57 degrees
T Axis: 41 degrees
Ventricular Rate: 59 {beats}/min

## 2016-10-10 MED ORDER — TRIAMCINOLONE 0.1 % TOPICAL OINTMENT AND DIMETHICONE 5 % TOPICAL CREAM
CUTANEOUS | 0 refills | Status: DC
Start: 2016-10-10 — End: 2017-07-23

## 2016-10-10 NOTE — ED Notes (Signed)
Patient given discharge instructions. No questions or concerns at this time. Patient's VSS and in no acute distress. Patient ambulatory out of unit at discharge.

## 2016-10-10 NOTE — ED Notes (Cosign Needed)
4:09 PM  I have evaluated the patient as the Provider in Triage. I have reviewed His vital signs and the triage nurse assessment. I have talked with the patient and any available family and advised that I am the provider in triage and have ordered the appropriate study to initiate their work up based on the clinical presentation during my assessment.  I have advised that the patient will be accommodated in the Main ED as soon as possible.  I have also requested to contact the triage nurse or myself immediately if the patient experiences any changes in their condition during this brief waiting period.    Patient reports bilateral leg weakness, dizziness, chest pain, shortness of breath and abdominal pain for the past 3 days. Concerned his symptoms are related to insect bites he got 4 nights ago.  Omar PersonHannah J Rusty Glodowski, PA

## 2016-10-10 NOTE — ED Provider Notes (Signed)
HPI Comments: 56 y.o. male with past medical history significant for Asthma who presents from home with chief complaint of dizziness. Patient states onset "three days ago" of dizziness, nausea, rash on bilateral upper and lower extremities, tingling in bilateral hands and knees, feeling "jittery", and headache.   Patient reports using Benadryl gel for rash with no relief. Patient also reports accompanying polydipsia, unmeasured fever, and chills. Patient notes "four days ago" walking in a park feeling SOB, and getting multiple bug bites, and upon examination expressed concern that symptoms are from bug bites. Patient denies taking new medications. Patient denies any ongoing medical problems. Pt denies cough, congestion, shortness of breath, chest pain, abdominal pain, nausea, vomiting, diarrhea, difficulty with urination or dysuria.     There are no other acute medical concerns at this time.    Note written by Regis Bill, Scribe, as dictated by Dr. Arelia Sneddon, MD 7:23 PM      The history is provided by the patient and the spouse.        Past Medical History:   Diagnosis Date   ??? Abdominal wall hernia    ??? Asthma    ??? High cholesterol        Past Surgical History:   Procedure Laterality Date   ??? ABDOMEN SURGERY PROC UNLISTED      hernia         History reviewed. No pertinent family history.    Social History     Social History   ??? Marital status: MARRIED     Spouse name: N/A   ??? Number of children: N/A   ??? Years of education: N/A     Occupational History   ??? Not on file.     Social History Main Topics   ??? Smoking status: Current Every Day Smoker     Packs/day: 0.10   ??? Smokeless tobacco: Never Used   ??? Alcohol use No   ??? Drug use: No   ??? Sexual activity: Not on file     Other Topics Concern   ??? Not on file     Social History Narrative         ALLERGIES: Bactrim [sulfamethoprim] and Ibuprofen    Review of Systems   Constitutional: Positive for chills and fever.    HENT: Negative for congestion and sore throat.    Eyes: Negative for pain.   Respiratory: Negative for cough and shortness of breath.    Cardiovascular: Negative for chest pain.   Gastrointestinal: Positive for nausea. Negative for abdominal pain, diarrhea and vomiting.   Endocrine: Positive for polydipsia.   Genitourinary: Negative for difficulty urinating and dysuria.   Skin: Positive for rash.   Neurological: Positive for dizziness and headaches. Negative for syncope.   Psychiatric/Behavioral: Negative for confusion.   All other systems reviewed and are negative.      Vitals:    10/10/16 1610 10/10/16 1751   BP: 150/85 140/74   Pulse: 74 64   Resp: 16 16   Temp: 97.7 ??F (36.5 ??C) 98 ??F (36.7 ??C)   SpO2: 97% 98%   Weight: 99.1 kg (218 lb 6.4 oz)    Height: 5\' 11"  (1.803 m)             Physical Exam   Constitutional: He is oriented to person, place, and time. He appears well-developed. No distress.   HENT:   Head: Normocephalic and atraumatic.   Eyes: Conjunctivae and EOM are normal.   Neck: Normal  range of motion. Neck supple.   Cardiovascular: Normal rate, regular rhythm and normal heart sounds.    No murmur heard.  Pulmonary/Chest: Effort normal and breath sounds normal. No respiratory distress.   Abdominal: Soft. Bowel sounds are normal. He exhibits no distension. There is no tenderness. There is no rebound.   Musculoskeletal: Normal range of motion. He exhibits no edema or tenderness.   Neurological: He is alert and oriented to person, place, and time. No cranial nerve deficit. He exhibits normal muscle tone. Coordination normal.   Skin: Skin is warm and dry.   Sand-papery rash on all extremities    Nursing note and vitals reviewed.  Note written by Regis BillKelsey Stroud, Scribe, as dictated by Dr. Arelia SneddonGregory N Jaryn Hocutt, MD 7:25 PM      MDM      ED Course       Procedures    A/P: Well-appearing male with multiple complaints, chief being a rash that occurred after multiple insect bites. No s/sx of anaphylaxis. Rash seems  like atopic dermatitis and pt reports childhood h/o asthma. Non toxic appearing. VSS. EMC stabilized today. F/U with PCP encouraged. RTER precautions for worsening sx.

## 2016-10-10 NOTE — ED Triage Notes (Addendum)
Pt c/o of dizziness, chest pain, SOB, diarrhea, BLE weakness, tingling in left side fingers, rash to BUE. Pt concerned symptoms r/t bug bites he received outdoors on Thursday.    Wife reports pt also has back pain.

## 2017-04-10 DIAGNOSIS — R300 Dysuria: Secondary | ICD-10-CM

## 2017-04-10 NOTE — ED Notes (Addendum)
2203 pt ambulated to restroom with steady gait and no assistance and voided x1 clear, yellow urine; urine specimen obtained and sent    2230 pt requesting something to eat/drink; per PA OK to give pt food/drink, pt provided with TV dinner and ginger ale per request

## 2017-04-10 NOTE — ED Notes (Signed)
PA reviewed discharge instructions and options with patient and patient verbalized understanding. RN reviewed discharge instructions using teachback method. Pt ambulated to exit without difficulty and in no signs of acute distress escorted by wife who  will drive home. No complaints or needs expressed at this time. Patient was counseled on medications prescribed at discharge. Pt hypertensive at time of discharge. Pt to call PCP in the morning for follow up.

## 2017-04-10 NOTE — ED Provider Notes (Signed)
57 y.o. male with past medical history significant for asthma, high cholesterol, hernia, s/p hernia repair, who presents ambulatory with wife to the ED with cc of penile itching. Pt reports itching and a burning sensation to his penis x 5-6 days. He reports associated frequency and darkened urine color. He adds he has had a burning sensation with bowel movements. He states his wife was recently diagnosed with a UTI, and he had a UTI the last time she had one. Pt denies hx of kidney infection. He specifically denies any penile discharge, abdominal pain, cough, congestion, fever, or chills.    There are no other acute medical concerns at this time.      Social Hx: former Tobacco use, denies EtOH use, denies Illicit Drug use  PCP: None    Note written by Carolann Littlerindy Song, Scribe, as dictated by  Veverly FellsAlyse N Darlen Gledhill, PA 9:53 PM        The history is provided by the patient and the spouse. No language interpreter was used.        Past Medical History:   Diagnosis Date   ??? Abdominal wall hernia    ??? Asthma    ??? High cholesterol        Past Surgical History:   Procedure Laterality Date   ??? ABDOMEN SURGERY PROC UNLISTED      hernia         No family history on file.    Social History     Socioeconomic History   ??? Marital status: MARRIED     Spouse name: Not on file   ??? Number of children: Not on file   ??? Years of education: Not on file   ??? Highest education level: Not on file   Social Needs   ??? Financial resource strain: Not on file   ??? Food insecurity - worry: Not on file   ??? Food insecurity - inability: Not on file   ??? Transportation needs - medical: Not on file   ??? Transportation needs - non-medical: Not on file   Occupational History   ??? Not on file   Tobacco Use   ??? Smoking status: Current Every Day Smoker     Packs/day: 0.10   ??? Smokeless tobacco: Never Used   Substance and Sexual Activity   ??? Alcohol use: No   ??? Drug use: No   ??? Sexual activity: Not on file   Other Topics Concern   ??? Not on file   Social History Narrative    ??? Not on file         ALLERGIES: Bactrim [sulfamethoprim] and Ibuprofen    Review of Systems   Constitutional: Negative.  Negative for diaphoresis and fever.   HENT: Negative for congestion and ear discharge.    Eyes: Negative for photophobia, pain, discharge and visual disturbance.   Respiratory: Negative for apnea, cough, chest tightness and shortness of breath.    Cardiovascular: Negative for chest pain, palpitations and leg swelling.   Gastrointestinal: Negative for abdominal distention, abdominal pain and blood in stool.   Genitourinary: Positive for dysuria and frequency. Negative for difficulty urinating, discharge, flank pain and hematuria.        + irritation to penis with itching and "burning"   Musculoskeletal: Negative for back pain, gait problem, joint swelling, myalgias and neck pain.   Skin: Negative for color change and pallor.   Neurological: Negative for dizziness, syncope, weakness, numbness and headaches.   Psychiatric/Behavioral: Negative for behavioral problems  and confusion. The patient is not nervous/anxious.        Vitals:    04/10/17 2137   Pulse: 75   SpO2: 98%            Physical Exam   Constitutional: He is oriented to person, place, and time. He appears well-developed and well-nourished. No distress.   HENT:   Head: Normocephalic and atraumatic.   Right Ear: External ear normal.   Left Ear: External ear normal.   Nose: Nose normal.   Mouth/Throat: Oropharynx is clear and moist.   Eyes: Conjunctivae and EOM are normal. Pupils are equal, round, and reactive to light. Right eye exhibits no discharge. Left eye exhibits no discharge.   Neck: Normal range of motion. Neck supple.   Cardiovascular: Normal rate, regular rhythm, normal heart sounds and intact distal pulses.   Pulmonary/Chest: Effort normal and breath sounds normal.   Abdominal: Soft. Bowel sounds are normal. He exhibits no distension. There is no tenderness. There is no rebound, no guarding and no CVA tenderness.    Musculoskeletal: Normal range of motion. He exhibits no edema or tenderness.   Neurological: He is alert and oriented to person, place, and time. No cranial nerve deficit. Coordination normal.   Skin: Skin is warm and dry. No rash noted.   Psychiatric: He has a normal mood and affect. His behavior is normal. Judgment and thought content normal.   Nursing note and vitals reviewed.     Note written by Carolann Littler, Scribe, as dictated by  Veverly Fells, PA 9:53 PM    MDM  Number of Diagnoses or Management Options  Dysuria:      Amount and/or Complexity of Data Reviewed  Clinical lab tests: reviewed and ordered           Procedures    Patient has been reassessed.  Feeling much better.  Reviewed labs, medications with patient.  Ready to discharge home. Will have PCP/Urology followup; aware of need for additional testing including prostate testing.      11:36 PM  Patient's results have been reviewed with them.  Patient and/or family have verbally conveyed their understanding and agreement of the patient's signs, symptoms, diagnosis, treatment and prognosis and additionally agree to follow up as recommended or return to the Emergency Room should their condition change prior to follow-up.  Discharge instructions have also been provided to the patient with some educational information regarding their diagnosis as well a list of reasons why they would want to return to the ER prior to their follow-up appointment should their condition change.  Veverly Fells, PA

## 2017-04-10 NOTE — ED Triage Notes (Signed)
TRIAGE: Pt arrives reporting itching and burning with urination. Pt reports his wife had a UTI recently, states his symptoms began 5-6 days ago. Denies abd pain

## 2017-04-11 ENCOUNTER — Inpatient Hospital Stay
Admit: 2017-04-11 | Discharge: 2017-04-11 | Disposition: A | Discharge status: Home / Self Care | Payer: MEDICARE | Attending: Emergency Medicine

## 2017-04-11 LAB — URINALYSIS W/MICROSCOPIC
Bacteria: NEGATIVE /hpf
Bilirubin: NEGATIVE
Blood: NEGATIVE
Glucose: NEGATIVE mg/dL
Ketone: NEGATIVE mg/dL
Leukocyte Esterase: NEGATIVE
Nitrites: NEGATIVE
Protein: NEGATIVE mg/dL
Specific gravity: 1.023 (ref 1.003–1.030)
Urobilinogen: 0.2 EU/dL (ref 0.2–1.0)
pH (UA): 6 (ref 5.0–8.0)

## 2017-04-11 LAB — URINE CULTURE HOLD SAMPLE

## 2017-04-12 LAB — CHLAMYDIA/GC PCR
Chlamydia amplified: NEGATIVE
N. gonorrhea, amplified: NEGATIVE

## 2017-07-10 ENCOUNTER — Inpatient Hospital Stay
Admit: 2017-07-10 | Discharge: 2017-07-11 | Disposition: A | Discharge status: Home / Self Care | Payer: MEDICARE | Attending: Emergency Medicine

## 2017-07-10 ENCOUNTER — Emergency Department: Admit: 2017-07-10 | Payer: MEDICARE

## 2017-07-10 DIAGNOSIS — S61213A Laceration without foreign body of left middle finger without damage to nail, initial encounter: Secondary | ICD-10-CM

## 2017-07-10 MED ORDER — CEPHALEXIN 500 MG CAP
500 mg | ORAL | Status: AC
Start: 2017-07-10 — End: 2017-07-10
  Administered 2017-07-10: 23:00:00 via ORAL

## 2017-07-10 MED ORDER — TRAMADOL 50 MG TAB
50 mg | ORAL_TABLET | Freq: Four times a day (QID) | ORAL | 0 refills | Status: AC | PRN
Start: 2017-07-10 — End: 2017-07-13

## 2017-07-10 MED ORDER — LIDOCAINE (PF) 10 MG/ML (1 %) IJ SOLN
10 mg/mL (1 %) | Freq: Once | INTRAMUSCULAR | Status: AC
Start: 2017-07-10 — End: 2017-07-10
  Administered 2017-07-10: 23:00:00 via SUBCUTANEOUS

## 2017-07-10 MED ORDER — BACITRACIN 500 UNIT/G TOPICAL PACKET
500 unit/gram | CUTANEOUS | Status: AC
Start: 2017-07-10 — End: 2017-07-10
  Administered 2017-07-10: 23:00:00 via TOPICAL

## 2017-07-10 MED ORDER — CEPHALEXIN 500 MG CAP
500 mg | ORAL_CAPSULE | Freq: Four times a day (QID) | ORAL | 0 refills | Status: AC
Start: 2017-07-10 — End: 2017-07-20

## 2017-07-10 MED FILL — BACITRACIN 500 UNIT/G TOPICAL PACKET: 500 unit/gram | CUTANEOUS | Qty: 1

## 2017-07-10 MED FILL — CEPHALEXIN 500 MG CAP: 500 mg | ORAL | Qty: 1

## 2017-07-10 MED FILL — LIDOCAINE (PF) 10 MG/ML (1 %) IJ SOLN: 10 mg/mL (1 %) | INTRAMUSCULAR | Qty: 10

## 2017-07-10 NOTE — ED Notes (Signed)
Pt ambulatory to xray.

## 2017-07-10 NOTE — ED Triage Notes (Signed)
Pt reports smashing left 3rd finger, presents with laceration. Bleeding controlled.

## 2017-07-10 NOTE — ED Notes (Signed)
2005: non stick dressing, tube gauze and finger splint applied to L 3rd digit  2020: PA reviewed discharge instructions and options with patient and patient verbalized understanding. Pt ambulated to exit without difficulty and in no signs of acute distress escorted by wife who will drive home. No complaints or needs expressed at this time. Patient was counseled on medications prescribed at discharge. VSS at time of discharge. Pt to call ortho in the morning for follow up.

## 2017-07-10 NOTE — ED Provider Notes (Signed)
57 y.o. male with past medical history significant for asthma, high cholesterol, status post hernia repair, presents ambulatory to the ED accompanied by wife with chief complaint of left middle finger pain. Patient reports that his left middle finger became lodged in between a metal clamp between 1200-1300 this afternoon. Estimates that there was 3500 lbs behind the clamp, and as a result he sustained a crush injury to the distal aspect of the digit. Patient states that he sustained a laceration to the left middle finger as a result of the injury, and since the injury occurred the digit has become significantly more swollen. He rates his current level of discomfort around the left middle finger as a 7/10 in severity. Patient also notes that his tetanus booster is up to date within the past 5 years (2016 or 2017). He denies any other injuries or areas of pain. There are no other acute medical concerns at this time.    Social hx: Denies current Tobacco use (former smoker); Denies EtOH use; Denies Illicit Drug Abuse   PCP: None    Note written by Guadlupe SpanishJessica A. Ravensbergen, Scribe, as dictated by Veverly FellsAlyse N Adalina Dopson, PA-C 6:59 PM     The history is provided by the patient. No language interpreter was used.        Past Medical History:   Diagnosis Date   ??? Abdominal wall hernia    ??? Asthma    ??? High cholesterol        Past Surgical History:   Procedure Laterality Date   ??? ABDOMEN SURGERY PROC UNLISTED      hernia         No family history on file.    Social History     Socioeconomic History   ??? Marital status: MARRIED     Spouse name: Not on file   ??? Number of children: Not on file   ??? Years of education: Not on file   ??? Highest education level: Not on file   Occupational History   ??? Not on file   Social Needs   ??? Financial resource strain: Not on file   ??? Food insecurity:     Worry: Not on file     Inability: Not on file   ??? Transportation needs:     Medical: Not on file     Non-medical: Not on file   Tobacco Use    ??? Smoking status: Current Every Day Smoker     Packs/day: 0.10   ??? Smokeless tobacco: Never Used   Substance and Sexual Activity   ??? Alcohol use: No   ??? Drug use: No   ??? Sexual activity: Not on file   Lifestyle   ??? Physical activity:     Days per week: Not on file     Minutes per session: Not on file   ??? Stress: Not on file   Relationships   ??? Social connections:     Talks on phone: Not on file     Gets together: Not on file     Attends religious service: Not on file     Active member of club or organization: Not on file     Attends meetings of clubs or organizations: Not on file     Relationship status: Not on file   ??? Intimate partner violence:     Fear of current or ex partner: Not on file     Emotionally abused: Not on file     Physically  abused: Not on file     Forced sexual activity: Not on file   Other Topics Concern   ??? Not on file   Social History Narrative   ??? Not on file         ALLERGIES: Bactrim [sulfamethoprim] and Ibuprofen    Review of Systems   Constitutional: Negative.    HENT: Negative for ear discharge.    Eyes: Negative for photophobia, pain, discharge and visual disturbance.   Respiratory: Negative for apnea, cough, chest tightness and shortness of breath.    Cardiovascular: Negative for chest pain, palpitations and leg swelling.   Gastrointestinal: Negative for abdominal distention, abdominal pain and blood in stool.   Genitourinary: Negative for difficulty urinating, dysuria, flank pain, frequency and hematuria.   Musculoskeletal: Positive for arthralgias (left middle finger). Negative for back pain, gait problem and neck pain.   Skin: Positive for wound (left middle finger). Negative for color change and pallor.   Neurological: Negative for dizziness, syncope, weakness, numbness and headaches.   Psychiatric/Behavioral: Negative for behavioral problems and confusion. The patient is not nervous/anxious.        Vitals:    07/10/17 1848 07/10/17 1900   BP:  (!) 167/101   Pulse: 79 74    Resp:  16   Temp:  98 ??F (36.7 ??C)   SpO2: 97% 100%            Physical Exam   Constitutional: He is oriented to person, place, and time. He appears well-nourished. No distress.   HENT:   Head: Normocephalic and atraumatic.   Eyes: Pupils are equal, round, and reactive to light.   Neck: Normal range of motion.   Cardiovascular: Normal rate and regular rhythm.   Pulmonary/Chest: Effort normal and breath sounds normal.   Abdominal: Soft. There is no tenderness.   Musculoskeletal: Normal range of motion.   LUE: 2 cm linear laceration to the distal aspect opf the left 3rd digit. No nail involvement. FROM. Bleeding controlled.    Neurological: He is alert and oriented to person, place, and time.   Skin: Skin is warm and dry.   Psychiatric: He has a normal mood and affect.   Nursing note and vitals reviewed.  Note written by Guadlupe Spanish. Ravensbergen, Scribe, as dictated by Veverly Fells, PA-C 6:59 PM      MDM  Number of Diagnoses or Management Options  Crushing injury:   Laceration of left middle finger without foreign body without damage to nail, initial encounter:   Open nondisplaced fracture of distal phalanx of left middle finger, initial encounter:      Amount and/or Complexity of Data Reviewed  Tests in the radiology section of CPT??: ordered and reviewed  Discuss the patient with other providers: yes  Independent visualization of images, tracings, or specimens: yes           Wound Repair  Date/Time: 07/10/2017 7:31 PM  Performed by: PAPreparation: skin prepped with Betadine (and Saline)  Location details: left long finger  Wound length:2.5 cm or less  Anesthesia: local infiltration    Anesthesia:  Local Anesthetic: lidocaine 1% with epinephrine  Anesthetic total: 5 mL  Foreign bodies: no foreign bodies  Irrigation solution: saline  Irrigation method: syringe  Debridement: none  Skin closure: 4-0 nylon  Number of sutures: 6  Technique: simple and interrupted  Approximation: close   Dressing: non-stick dressing, finger splint.  Patient tolerance: Patient tolerated the procedure well with no immediate complications  My total  time at bedside, performing this procedure was 1-15 minutes.  Comments: Scrubbed wound with chlorhexidine scrub.     Note written by Guadlupe Spanish. Ravensbergen, Scribe, as dictated by Veverly Fells, PA-C 7:45 PM         PROGRESS NOTE:  7:47 PM  Discussed case with attending physician, Cam Hai, MD, who is in agreement with care plan. Reviewed x-rays. Patient will follow-up with hand surgeon on an outpatient basis. Discussed with patient how to clean and dress wound daily, and he expresses agreement and understanding with all of the above.     7:53 PM  Patient's results have been reviewed with them.  Patient and/or family have verbally conveyed their understanding and agreement of the patient's signs, symptoms, diagnosis, treatment and prognosis and additionally agree to follow up as recommended or return to the Emergency Room should their condition change prior to follow-up.  Discharge instructions have also been provided to the patient with some educational information regarding their diagnosis as well a list of reasons why they would want to return to the ER prior to their follow-up appointment should their condition change.   Veverly Fells, PA

## 2017-07-11 MED ORDER — TRAMADOL 50 MG TAB
50 mg | ORAL | Status: AC
Start: 2017-07-11 — End: 2017-07-10
  Administered 2017-07-11: via ORAL

## 2017-07-11 MED FILL — TRAMADOL 50 MG TAB: 50 mg | ORAL | Qty: 1

## 2017-07-23 ENCOUNTER — Emergency Department

## 2017-07-23 ENCOUNTER — Emergency Department: Admit: 2017-07-23 | Payer: MEDICARE

## 2017-07-23 ENCOUNTER — Inpatient Hospital Stay
Admit: 2017-07-23 | Discharge: 2017-07-23 | Disposition: A | Discharge status: Home / Self Care | Payer: MEDICARE | Attending: Student in an Organized Health Care Education/Training Program

## 2017-07-23 DIAGNOSIS — S62633D Displaced fracture of distal phalanx of left middle finger, subsequent encounter for fracture with routine healing: Secondary | ICD-10-CM

## 2017-07-23 LAB — LIPASE: Lipase: 98 U/L (ref 73–393)

## 2017-07-23 LAB — URINALYSIS W/MICROSCOPIC
Bacteria: NEGATIVE /hpf
Bilirubin: NEGATIVE
Blood: NEGATIVE
Glucose: NEGATIVE mg/dL
Ketone: NEGATIVE mg/dL
Leukocyte Esterase: NEGATIVE
Nitrites: NEGATIVE
Protein: NEGATIVE mg/dL
Specific gravity: 1.028 (ref 1.003–1.030)
Urobilinogen: 0.2 EU/dL (ref 0.2–1.0)
pH (UA): 6 (ref 5.0–8.0)

## 2017-07-23 LAB — CBC WITH AUTOMATED DIFF
ABS. BASOPHILS: 0 10*3/uL (ref 0.0–0.1)
ABS. EOSINOPHILS: 0.2 10*3/uL (ref 0.0–0.4)
ABS. IMM. GRANS.: 0 10*3/uL (ref 0.00–0.04)
ABS. LYMPHOCYTES: 2.1 10*3/uL (ref 0.8–3.5)
ABS. MONOCYTES: 0.6 10*3/uL (ref 0.0–1.0)
ABS. NEUTROPHILS: 4.6 10*3/uL (ref 1.8–8.0)
ABSOLUTE NRBC: 0 10*3/uL (ref 0.00–0.01)
BASOPHILS: 1 % (ref 0–1)
EOSINOPHILS: 3 % (ref 0–7)
HCT: 44.9 % (ref 36.6–50.3)
HGB: 15.3 g/dL (ref 12.1–17.0)
IMMATURE GRANULOCYTES: 0 % (ref 0.0–0.5)
LYMPHOCYTES: 28 % (ref 12–49)
MCH: 32.1 PG (ref 26.0–34.0)
MCHC: 34.1 g/dL (ref 30.0–36.5)
MCV: 94.1 FL (ref 80.0–99.0)
MONOCYTES: 8 % (ref 5–13)
MPV: 10.1 FL (ref 8.9–12.9)
NEUTROPHILS: 60 % (ref 32–75)
NRBC: 0 PER 100 WBC
PLATELET: 292 10*3/uL (ref 150–400)
RBC: 4.77 M/uL (ref 4.10–5.70)
RDW: 12.9 % (ref 11.5–14.5)
WBC: 7.6 10*3/uL (ref 4.1–11.1)

## 2017-07-23 LAB — METABOLIC PANEL, COMPREHENSIVE
A-G Ratio: 0.9 — ABNORMAL LOW (ref 1.1–2.2)
ALT (SGPT): 23 U/L (ref 12–78)
AST (SGOT): 18 U/L (ref 15–37)
Albumin: 3.7 g/dL (ref 3.5–5.0)
Alk. phosphatase: 70 U/L (ref 45–117)
Anion gap: 5 mmol/L (ref 5–15)
BUN/Creatinine ratio: 9 — ABNORMAL LOW (ref 12–20)
BUN: 11 MG/DL (ref 6–20)
Bilirubin, total: 0.8 MG/DL (ref 0.2–1.0)
CO2: 25 mmol/L (ref 21–32)
Calcium: 8.8 MG/DL (ref 8.5–10.1)
Chloride: 108 mmol/L (ref 97–108)
Creatinine: 1.22 MG/DL (ref 0.70–1.30)
GFR est AA: >60 mL/min/{1.73_m2} (ref >60)
GFR est non-AA: >60 mL/min/{1.73_m2} (ref >60)
Globulin: 4.1 g/dL — ABNORMAL HIGH (ref 2.0–4.0)
Glucose: 85 mg/dL (ref 65–100)
Potassium: 4.2 mmol/L (ref 3.5–5.1)
Protein, total: 7.8 g/dL (ref 6.4–8.2)
Sodium: 138 mmol/L (ref 136–145)

## 2017-07-23 LAB — URINE CULTURE HOLD SAMPLE

## 2017-07-23 MED ORDER — LIDOCAINE 5 % (700 MG/PATCH) ADHESIVE PATCH
5 % | CUTANEOUS | 0 refills | Status: DC
Start: 2017-07-23 — End: 2018-09-20

## 2017-07-23 NOTE — ED Provider Notes (Signed)
57 y.o. male with past medical history significant for asthma, high cholesterol, and abdominal wall hernia who presents from home via personal vehicle with chief complaint of flank pain. Pt presents to the ED c/o left sided flank pain with onset 4-5 days ago. Pt also reports that his urine has been darker lately. Pt denies injury to the area. Pt reports trying icy hot and ibuprofen without relief. Pt states that his pain is exacerbated with movement. Pt denies chest pain, abdominal pain, and a rash. Pt also notes that he would needs his sutures to be removed from his left middle finger. Pt reports that he never received a call to tell him where to follow-up. He states that upon registering today, he realized his phone number was in the system incorrectly. Pt states he was told to have the stitches removed 10 day later. Pt notes initial injury to be 07/10/17. There are no other acute medical concerns at this time.  Social hx: Former smoker.     Note written by Norma Fredrickson, scribe, as dictated by Arelia Sneddon, MD 11:25 AM      The history is provided by the patient and the spouse. No language interpreter was used.        Past Medical History:   Diagnosis Date   ??? Abdominal wall hernia    ??? Asthma    ??? High cholesterol        Past Surgical History:   Procedure Laterality Date   ??? ABDOMEN SURGERY PROC UNLISTED      hernia         History reviewed. No pertinent family history.    Social History     Socioeconomic History   ??? Marital status: MARRIED     Spouse name: Not on file   ??? Number of children: Not on file   ??? Years of education: Not on file   ??? Highest education level: Not on file   Occupational History   ??? Not on file   Social Needs   ??? Financial resource strain: Not on file   ??? Food insecurity:     Worry: Not on file     Inability: Not on file   ??? Transportation needs:     Medical: Not on file     Non-medical: Not on file   Tobacco Use   ??? Smoking status: Former Smoker   ??? Smokeless tobacco: Never Used    Substance and Sexual Activity   ??? Alcohol use: No   ??? Drug use: No   ??? Sexual activity: Not on file   Lifestyle   ??? Physical activity:     Days per week: Not on file     Minutes per session: Not on file   ??? Stress: Not on file   Relationships   ??? Social connections:     Talks on phone: Not on file     Gets together: Not on file     Attends religious service: Not on file     Active member of club or organization: Not on file     Attends meetings of clubs or organizations: Not on file     Relationship status: Not on file   ??? Intimate partner violence:     Fear of current or ex partner: Not on file     Emotionally abused: Not on file     Physically abused: Not on file     Forced sexual activity: Not on file  Other Topics Concern   ??? Not on file   Social History Narrative   ??? Not on file         ALLERGIES: Bactrim [sulfamethoprim] and Ibuprofen    Review of Systems   Constitutional: Negative for chills and fever.   HENT: Negative for sore throat.    Eyes: Negative for pain.   Respiratory: Negative for cough and shortness of breath.    Cardiovascular: Negative for chest pain.   Gastrointestinal: Negative for abdominal pain and vomiting.   Genitourinary: Positive for flank pain (left). Negative for dysuria.        + Dark urine.   Skin: Positive for wound (suture removal). Negative for rash.   Neurological: Negative for syncope and headaches.   Psychiatric/Behavioral: Negative for confusion.   All other systems reviewed and are negative.      Vitals:    07/23/17 1044   BP: 140/89   Resp: 18   Temp: 98 ??F (36.7 ??C)   SpO2: 97%   Weight: 103 kg (227 lb)   Height: 6' (1.829 m)            Physical Exam   Constitutional: He is oriented to person, place, and time. He appears well-developed and well-nourished. No distress.   HENT:   Head: Normocephalic and atraumatic.   Eyes: Conjunctivae and EOM are normal.   Neck: Normal range of motion. Neck supple.   Cardiovascular: Normal rate, regular rhythm and normal heart sounds.    Pulmonary/Chest: Effort normal and breath sounds normal. No respiratory distress.   Abdominal: Soft. There is CVA tenderness (left sided). There is no guarding.   Musculoskeletal: Normal range of motion. He exhibits no edema.   Neurological: He is alert and oriented to person, place, and time. He exhibits normal muscle tone.   Skin: Skin is warm and dry.   Left third finger: healing laceration without erythema or tenderness to palpation.    Nursing note and vitals reviewed.     Note written by Norma Fredrickson, scribe, as dictated by Arelia Sneddon, MD 11:25 AM    MDM    11:48am: 57 YO M with 4-5 days of L colicky flank pain and dark urine today. DDx includes kidney stones, UTI/pyelonephritis, early shingles, PUD, gastritis. Doubt ACS, PNA, or other cardiothoracic source. Will check UA and labs and obtain dry scan to look for stone. Anticipated dc home pendning study results.    Procedures

## 2017-07-23 NOTE — ED Notes (Signed)
Pt attempting to urinate at bedside

## 2017-07-23 NOTE — ED Triage Notes (Addendum)
Patient reports "spasms" in left flank/side for 3-4 days. Also reports dark urine and "it has been harder to go".    Also requesting suture removal to left 3rd finger. "She was supposed to call to let me know who to follow up with and never did. She said 10 days for removal." Initial injury 07/10/17.

## 2017-07-23 NOTE — ED Notes (Signed)
Patient's left middle finger splinted and wrapped per Randa Lynn MD.    Pt and Spouse given discharge instructions, patient education, 1 prescription, and follow up information.  Pt verbalizes understanding.  All questions answered.  Pt discharged to home in private vehicle, ambulatory.  Pt A/Ox4, RA, pain controlled.    No sign of distress noted at time of discharge.

## 2017-07-23 NOTE — ED Notes (Signed)
Patient reports left side rib pain worse with movement worse with movement. Also states dark urine. Sutures to left 3rd finger that needs to be removed. No redness or drainage to the finger.

## 2017-07-23 NOTE — ED Notes (Signed)
PROGRESS NOTE:  12:05 PM  Removed 3 of the lateral sutures. Left the remaining medial sutures intact with instruction to have those removed in 3-5 days. Pt states he has approximately 4-5 more doses of his antibiotic. He states that sometimes he forgets to take then while at work. Pt has been wrapping his finger in a band-aid and then placing a metal splint over his finger for protection while at work. Will send him home with some guaze to use in place of the band-aid and instructed him to continue using the metal splint. No sign of infection observed. Will x-ray the patients finger again based on the previous medical chart and the patient's continued pain.

## 2017-07-23 NOTE — ED Notes (Signed)
Called xray for eta.

## 2017-08-01 ENCOUNTER — Inpatient Hospital Stay
Admit: 2017-08-01 | Discharge: 2017-08-01 | Disposition: A | Discharge status: Home / Self Care | Payer: MEDICARE | Attending: Emergency Medicine

## 2017-08-01 DIAGNOSIS — Z4802 Encounter for removal of sutures: Secondary | ICD-10-CM

## 2017-08-01 NOTE — ED Triage Notes (Signed)
Suture removal from left hand

## 2017-08-01 NOTE — ED Notes (Signed)
Sutures removed from left middle finger by Bradly Chris, NP

## 2017-08-01 NOTE — ED Notes (Signed)
Pt left ambulatory with discharge instructions accompanied by his wife and son.

## 2017-08-01 NOTE — ED Provider Notes (Signed)
Initial Complaint: Suture removal    Three removed on 07/23/2017. Here today to have remainder removed.     No fevers, chills or drainage.     No further complaints.     Past Medical History:  No date: Abdominal wall hernia  No date: Asthma  No date: High cholesterol  Past Surgical History:  No date: ABDOMEN SURGERY PROC UNLISTED      Comment:  hernia  Reviewed      Primary care provider: None         Past Medical History:   Diagnosis Date   ??? Abdominal wall hernia    ??? Asthma    ??? High cholesterol      Past Surgical History:   Procedure Laterality Date   ??? ABDOMEN SURGERY PROC UNLISTED      hernia     No family history on file.    Social History     Socioeconomic History   ??? Marital status: MARRIED     Spouse name: Not on file   ??? Number of children: Not on file   ??? Years of education: Not on file   ??? Highest education level: Not on file   Occupational History   ??? Not on file   Social Needs   ??? Financial resource strain: Not on file   ??? Food insecurity:     Worry: Not on file     Inability: Not on file   ??? Transportation needs:     Medical: Not on file     Non-medical: Not on file   Tobacco Use   ??? Smoking status: Former Smoker   ??? Smokeless tobacco: Never Used   Substance and Sexual Activity   ??? Alcohol use: No   ??? Drug use: No   ??? Sexual activity: Not on file   Lifestyle   ??? Physical activity:     Days per week: Not on file     Minutes per session: Not on file   ??? Stress: Not on file   Relationships   ??? Social connections:     Talks on phone: Not on file     Gets together: Not on file     Attends religious service: Not on file     Active member of club or organization: Not on file     Attends meetings of clubs or organizations: Not on file     Relationship status: Not on file   ??? Intimate partner violence:     Fear of current or ex partner: Not on file     Emotionally abused: Not on file     Physically abused: Not on file     Forced sexual activity: Not on file   Other Topics Concern   ??? Not on file    Social History Narrative   ??? Not on file     ALLERGIES: Bactrim [sulfamethoprim] and Ibuprofen    Review of Systems   Skin: Positive for wound.   All other systems reviewed and are negative.    Vitals:    08/01/17 1644 08/01/17 1756   BP:  (!) 157/93   Pulse:  63   Resp:  16   Temp:  98.4 ??F (36.9 ??C)   SpO2: 97% 95%   Weight:  103 kg (227 lb)   Height:  6' (1.829 m)            Physical Exam   Constitutional: He appears well-developed and well-nourished.   HENT:  Head: Atraumatic.   Eyes: EOM are normal.   Neck: No tracheal deviation present.   Pulmonary/Chest: Effort normal. No respiratory distress.   Musculoskeletal: Normal range of motion.        Hands:  Peeling skin from the around the laceration site. Wound intact. Remaining 4 sutures removed.    Neurological: He is alert.   Skin: Skin is warm and dry.   Psychiatric: He has a normal mood and affect. His behavior is normal. Judgment and thought content normal.   Nursing note and vitals reviewed.       MDM       Suture/Staple Removal  Date/Time: 08/01/2017 5:58 PM  Performed by: Marvia Pickles, NP  Authorized by: Marvia Pickles, NP     Consent:     Consent obtained:  Verbal    Consent given by:  Patient  Location:     Location:  Upper extremity    Upper extremity location:  Hand    Hand location:  L long finger  Procedure details:     Wound appearance:  No signs of infection, good wound healing and pink    Number of sutures removed:  4  Post-procedure details:     Post-removal:  Dressing applied    Patient tolerance of procedure:  Tolerated with difficulty      Assessment & Plan:     Orders Placed This Encounter   ??? SUTURE / STAPLE REMOVAL BY PROVIDER       Discussed with Vella Kohler, MD,ED Provider    Marvia Pickles, NP  08/01/17  6:00 PM      LABORATORY TESTS:  Labs Reviewed - No data to display    IMAGING RESULTS:  No results found.    MEDICATIONS GIVEN:  Medications - No data to display    IMPRESSION:  1. Visit for suture removal        PLAN:  1.    Current Discharge Medication List      CONTINUE these medications which have NOT CHANGED    Details   lidocaine (LIDODERM) 5 % Apply patch to the affected area for 12 hours a day and remove for 12 hours a day.  Qty: 5 Each, Refills: 0           2.   Follow-up Information     Follow up With Specialties Details Why Contact Info    [facility]   [address]  [place] [address]  [phone]    [facility] EMERGENCY DEP Emergency Medicine  As needed, If symptoms worsen [address]  [place] [address]  [phone]        3.     Return to ED for new or worsening symptoms       Marvia Pickles, NP

## 2017-10-03 ENCOUNTER — Inpatient Hospital Stay: Admit: 2017-10-03 | Discharge: 2017-10-03 | Payer: MEDICARE | Attending: Emergency Medicine

## 2017-10-03 NOTE — ED Notes (Signed)
Informed by registration that pt left. Told registration he did not want to speak with RN before leaving.

## 2017-10-03 NOTE — ED Notes (Signed)
Informed by registration that pt left. Told registration he did not want to speak with RN before leaving.

## 2017-10-04 ENCOUNTER — Emergency Department: Admit: 2017-10-04 | Payer: MEDICARE

## 2017-10-04 ENCOUNTER — Inpatient Hospital Stay
Admit: 2017-10-04 | Discharge: 2017-10-04 | Disposition: A | Discharge status: Home / Self Care | Payer: MEDICARE | Attending: Emergency Medicine

## 2017-10-04 DIAGNOSIS — M170 Bilateral primary osteoarthritis of knee: Secondary | ICD-10-CM

## 2017-10-04 MED ORDER — TRAMADOL 50 MG TAB
50 mg | ORAL_TABLET | Freq: Four times a day (QID) | ORAL | 0 refills | Status: DC | PRN
Start: 2017-10-04 — End: 2017-10-04

## 2017-10-04 MED ORDER — HYDROCODONE-ACETAMINOPHEN 5 MG-325 MG TAB
5-325 mg | ORAL_TABLET | Freq: Four times a day (QID) | ORAL | 0 refills | Status: AC | PRN
Start: 2017-10-04 — End: 2017-10-07

## 2017-10-04 NOTE — ED Notes (Signed)
Pt presents with bilateral knee swelling. Pt states he has arthritis and they "swell up sometimes" Pt reports pain when walking or bearing weight

## 2017-10-04 NOTE — ED Provider Notes (Signed)
57 year old male presents for bilateral knee pain and discomfort which didn't present for several years however acutely worsening over the past week. He has tried some over-the-counter medications with minimal relief, he also had some left over tramadol from a previous injury in which she is dry with very limited results. He states that he played football for several years and as a result unfortunately took a toll on his knees. He has been seen evaluated by orthopedists in [place], [place] and he was told at some point he may have to have some surgical intervention. 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 He is motivated for a couple of days off.    His medical history is unremarkable with the exception of bilateral knee pain. He hasn't noticed any effusion, or paresthesias or change in color. He denies any calf pain or pain in the popliteal space.            Past Medical History:   Diagnosis Date   ??? Abdominal wall hernia    ??? Asthma    ??? High cholesterol        Past Surgical History:   Procedure Laterality Date   ??? ABDOMEN SURGERY PROC UNLISTED      hernia         History reviewed. No pertinent family history.    Social History     Socioeconomic History   ??? Marital status: WIDOWED     Spouse name: Not on file   ??? Number of children: Not on file   ??? Years of education: Not on file   ??? Highest education level: Not on file   Occupational History   ??? Not on file   Social Needs   ??? Financial resource strain: Not on file   ??? Food insecurity:     Worry: Not on file     Inability: Not on file   ??? Transportation needs:     Medical: Not on file     Non-medical: Not on file   Tobacco Use   ??? Smoking status: Former Smoker   ??? Smokeless tobacco: Never Used   Substance and Sexual Activity   ??? Alcohol use: No   ??? Drug use: No   ??? Sexual activity: Not on file   Lifestyle   ??? Physical activity:     Days per week: Not on file     Minutes per session:  Not on file   ??? Stress: Not on file   Relationships   ??? Social connections:     Talks on phone: Not on file     Gets together: Not on file     Attends religious service: Not on file     Active member of club or organization: Not on file     Attends meetings of clubs or organizations: Not on file     Relationship status: Not on file   ??? Intimate partner violence:     Fear of current or ex partner: Not on file     Emotionally abused: Not on file     Physically abused: Not on file     Forced sexual activity: Not on file   Other Topics Concern   ??? Not on file   Social History Narrative   ??? Not on file         ALLERGIES: Bactrim [sulfamethoprim] and Ibuprofen    Review of Systems  Constitutional: Positive for activity change. Negative for diaphoresis, fatigue, fever and unexpected weight change.   Respiratory: Negative.    Cardiovascular: Negative.    Gastrointestinal: Negative.    Genitourinary: Negative.    Musculoskeletal: Positive for gait problem, joint swelling and myalgias.   Skin: Negative.    Hematological: Negative.    Psychiatric/Behavioral: Negative.        Vitals:    10/04/17 1414 10/04/17 1441   BP:  170/83   Pulse: 78 69   Resp:  16   Temp:  98.4 ??F (36.9 ??C)   SpO2: 98% 98%   Weight:  100.7 kg (222 lb)   Height:  5\' 11"  (1.803 m)            Physical Exam   Constitutional: He is oriented to person, place, and time. He appears well-developed and well-nourished. No distress.   HENT:   Head: Normocephalic and atraumatic.   Eyes: Pupils are equal, round, and reactive to light. Conjunctivae and EOM are normal.   Neck: Normal range of motion. Neck supple.   Cardiovascular: Normal rate.   Pulses:       Femoral pulses are 2+ on the right side, and 2+ on the left side.       Popliteal pulses are 2+ on the right side, and 2+ on the left side.        Dorsalis pedis pulses are 2+ on the right side, and 2+ on the left side.        Posterior tibial pulses are 2+ on the right side, and 2+ on the left side.    Pulmonary/Chest: Effort normal.   Musculoskeletal: He exhibits tenderness. He exhibits no edema or deformity.        Right hip: Normal.        Left hip: Normal.        Right knee: He exhibits swelling, effusion and bony tenderness. He exhibits normal range of motion, no ecchymosis, no erythema, normal patellar mobility and no MCL laxity. Tenderness found. No MCL tenderness noted.        Left knee: He exhibits decreased range of motion, swelling and effusion. He exhibits no erythema, normal alignment and normal patellar mobility. Tenderness found.   Negative Homans with dorsiflexion. Negative Lachman's, posterior drawer and McMurray's. No popliteal cyst or pain.    Neurological: He is alert and oriented to person, place, and time. He displays no atrophy and normal reflexes. No sensory deficit. He exhibits normal muscle tone. Coordination and gait normal.   Reflex Scores:       Patellar reflexes are 2+ on the right side and 2+ on the left side.  Skin: Skin is warm and dry. Capillary refill takes less than 2 seconds. No rash noted. He is not diaphoretic. No erythema.   Psychiatric: He has a normal mood and affect. His behavior is normal. Judgment and thought content normal.   Nursing note and vitals reviewed.       MDM  Number of Diagnoses or Management Options  Osteoarthritis of both knees, unspecified osteoarthritis type: new and requires workup  Diagnosis management comments:     Physical findings are suggestive of likely internal derangement with  Concomitant osteoarthritis as appreciated on radiographs. Unable to tolerate NSAIDs, has failed tramadol in the past. No bleed but some reasonable to give him a short course of narcotic and have him followup with orthopedic as he would likely benefit from steroid injections her physical therapy.  Amount and/or Complexity of Data Reviewed  Tests in the radiology section of CPT??: reviewed and ordered  Independent visualization of images, tracings, or specimens:  yes           Procedures

## 2017-10-04 NOTE — ED Triage Notes (Signed)
Pt presents with bilateral knee swelling. Pt states he has arthritis and they "swell up sometimes" Pt reports pain when walking or bearing weight

## 2017-10-04 NOTE — ED Provider Notes (Signed)
57 year old male presents for bilateral knee pain and discomfort which didn't present for several years however acutely worsening over the past week. He has tried some over-the-counter medications with minimal relief, he also had some left over tramadol from a previous injury in which she is dry with very limited results. He states that he played football for several years and as a result unfortunately took a toll on his knees. He has been seen evaluated by orthopedists in [place], [place] and he was told at some point he may have to have some surgical intervention. 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 He is motivated for a couple of days off.    His medical history is unremarkable with the exception of bilateral knee pain. He hasn't noticed any effusion, or paresthesias or change in color. He denies any calf pain or pain in the popliteal space.            Past Medical History:   Diagnosis Date   ??? Abdominal wall hernia    ??? Asthma    ??? High cholesterol        Past Surgical History:   Procedure Laterality Date   ??? ABDOMEN SURGERY PROC UNLISTED      hernia         History reviewed. No pertinent family history.    Social History     Socioeconomic History   ??? Marital status: WIDOWED     Spouse name: Not on file   ??? Number of children: Not on file   ??? Years of education: Not on file   ??? Highest education level: Not on file   Occupational History   ??? Not on file   Social Needs   ??? Financial resource strain: Not on file   ??? Food insecurity:     Worry: Not on file     Inability: Not on file   ??? Transportation needs:     Medical: Not on file     Non-medical: Not on file   Tobacco Use   ??? Smoking status: Former Smoker   ??? Smokeless tobacco: Never Used   Substance and Sexual Activity   ??? Alcohol use: No   ??? Drug use: No   ??? Sexual activity: Not on file   Lifestyle   ??? Physical activity:     Days per week: Not on file      Minutes per session: Not on file   ??? Stress: Not on file   Relationships   ??? Social connections:     Talks on phone: Not on file     Gets together: Not on file     Attends religious service: Not on file     Active member of club or organization: Not on file     Attends meetings of clubs or organizations: Not on file     Relationship status: Not on file   ??? Intimate partner violence:     Fear of current or ex partner: Not on file     Emotionally abused: Not on file     Physically abused: Not on file     Forced sexual activity: Not on file   Other Topics Concern   ??? Not on file   Social History Narrative   ??? Not on file         ALLERGIES: Bactrim [sulfamethoprim] and Ibuprofen    Review of Systems  Constitutional: Positive for activity change. Negative for diaphoresis, fatigue, fever and unexpected weight change.   Respiratory: Negative.    Cardiovascular: Negative.    Gastrointestinal: Negative.    Genitourinary: Negative.    Musculoskeletal: Positive for gait problem, joint swelling and myalgias.   Skin: Negative.    Hematological: Negative.    Psychiatric/Behavioral: Negative.        Vitals:    10/04/17 1414 10/04/17 1441   BP:  170/83   Pulse: 78 69   Resp:  16   Temp:  98.4 ??F (36.9 ??C)   SpO2: 98% 98%   Weight:  100.7 kg (222 lb)   Height:  5\' 11"  (1.803 m)            Physical Exam   Constitutional: He is oriented to person, place, and time. He appears well-developed and well-nourished. No distress.   HENT:   Head: Normocephalic and atraumatic.   Eyes: Pupils are equal, round, and reactive to light. Conjunctivae and EOM are normal.   Neck: Normal range of motion. Neck supple.   Cardiovascular: Normal rate.   Pulses:       Femoral pulses are 2+ on the right side, and 2+ on the left side.       Popliteal pulses are 2+ on the right side, and 2+ on the left side.        Dorsalis pedis pulses are 2+ on the right side, and 2+ on the left side.         Posterior tibial pulses are 2+ on the right side, and 2+ on the left side.   Pulmonary/Chest: Effort normal.   Musculoskeletal: He exhibits tenderness. He exhibits no edema or deformity.        Right hip: Normal.        Left hip: Normal.        Right knee: He exhibits swelling, effusion and bony tenderness. He exhibits normal range of motion, no ecchymosis, no erythema, normal patellar mobility and no MCL laxity. Tenderness found. No MCL tenderness noted.        Left knee: He exhibits decreased range of motion, swelling and effusion. He exhibits no erythema, normal alignment and normal patellar mobility. Tenderness found.   Negative Homans with dorsiflexion. Negative Lachman's, posterior drawer and McMurray's. No popliteal cyst or pain.    Neurological: He is alert and oriented to person, place, and time. He displays no atrophy and normal reflexes. No sensory deficit. He exhibits normal muscle tone. Coordination and gait normal.   Reflex Scores:       Patellar reflexes are 2+ on the right side and 2+ on the left side.  Skin: Skin is warm and dry. Capillary refill takes less than 2 seconds. No rash noted. He is not diaphoretic. No erythema.   Psychiatric: He has a normal mood and affect. His behavior is normal. Judgment and thought content normal.   Nursing note and vitals reviewed.       MDM  Number of Diagnoses or Management Options  Osteoarthritis of both knees, unspecified osteoarthritis type: new and requires workup  Diagnosis management comments:     Physical findings are suggestive of likely internal derangement with  Concomitant osteoarthritis as appreciated on radiographs. Unable to tolerate NSAIDs, has failed tramadol in the past. No bleed but some reasonable to give him a short course of narcotic and have him followup with orthopedic as he would likely benefit from steroid injections her physical therapy.  Amount and/or Complexity of Data Reviewed   Tests in the radiology section of CPT??: reviewed and ordered  Independent visualization of images, tracings, or specimens: yes           Procedures

## 2017-10-25 ENCOUNTER — Inpatient Hospital Stay
Admit: 2017-10-25 | Discharge: 2017-10-25 | Disposition: A | Discharge status: Home / Self Care | Payer: MEDICARE | Attending: Emergency Medicine

## 2017-10-25 DIAGNOSIS — S161XXA Strain of muscle, fascia and tendon at neck level, initial encounter: Secondary | ICD-10-CM

## 2017-10-25 MED ORDER — DIAZEPAM 5 MG TAB
5 mg | ORAL_TABLET | Freq: Two times a day (BID) | ORAL | 0 refills | Status: DC
Start: 2017-10-25 — End: 2018-09-20

## 2017-10-25 NOTE — ED Provider Notes (Signed)
57 y.o. male with past medical history significant for asthma who presents from home via private vehicle with chief complaint of neck pain. Patient reports 2-3 days of right neck pain radiating to his right shoulder and to his right posterior head. He thinks maybe he slept in a bad position. Patient reports taking Motrin at home without relief. Pain has gradually worsened, and now he reports decreased ROM of his neck 2/2 pain. Patient reports a h/o similar pain with a previous pulled trapeizus muscle. Patient denies any radiation down his arm. There are no other acute medical concerns at this time.    Social hx: Former smoker; No EtOH use    Note written by Delfino Lovett, Scribe, as dictated by Raynald Kemp, MD 6:59 AM    The history is provided by the patient. No language interpreter was used.        Past Medical History:   Diagnosis Date   ??? Abdominal wall hernia    ??? Asthma    ??? High cholesterol        Past Surgical History:   Procedure Laterality Date   ??? ABDOMEN SURGERY PROC UNLISTED      hernia         History reviewed. No pertinent family history.    Social History     Socioeconomic History   ??? Marital status: WIDOWED     Spouse name: Not on file   ??? Number of children: Not on file   ??? Years of education: Not on file   ??? Highest education level: Not on file   Occupational History   ??? Not on file   Social Needs   ??? Financial resource strain: Not on file   ??? Food insecurity:     Worry: Not on file     Inability: Not on file   ??? Transportation needs:     Medical: Not on file     Non-medical: Not on file   Tobacco Use   ??? Smoking status: Former Smoker   ??? Smokeless tobacco: Never Used   Substance and Sexual Activity   ??? Alcohol use: No   ??? Drug use: No   ??? Sexual activity: Not on file   Lifestyle   ??? Physical activity:     Days per week: Not on file     Minutes per session: Not on file   ??? Stress: Not on file   Relationships   ??? Social connections:     Talks on phone: Not on file     Gets together: Not on file      Attends religious service: Not on file     Active member of club or organization: Not on file     Attends meetings of clubs or organizations: Not on file     Relationship status: Not on file   ??? Intimate partner violence:     Fear of current or ex partner: Not on file     Emotionally abused: Not on file     Physically abused: Not on file     Forced sexual activity: Not on file   Other Topics Concern   ??? Not on file   Social History Narrative   ??? Not on file         ALLERGIES: Bactrim [sulfamethoprim] and Ibuprofen    Review of Systems   Constitutional: Negative for appetite change, chills and fever.   HENT: Negative for rhinorrhea, sore throat and trouble swallowing.  Eyes: Negative for photophobia.   Respiratory: Negative for cough and shortness of breath.    Cardiovascular: Negative for chest pain and palpitations.   Gastrointestinal: Negative for abdominal pain, nausea and vomiting.   Genitourinary: Negative for dysuria, frequency and hematuria.   Musculoskeletal: Positive for arthralgias and neck pain. Negative for myalgias.   Neurological: Positive for headaches. Negative for dizziness, syncope and weakness.   Psychiatric/Behavioral: Negative for behavioral problems. The patient is not nervous/anxious.    All other systems reviewed and are negative.      Vitals:    10/25/17 0638 10/25/17 0644   BP:  (!) 161/102   Pulse: 80 75   Resp:  15   Temp:  97.7 ??F (36.5 ??C)   SpO2: 97% 97%            Physical Exam   Constitutional: He appears well-developed and well-nourished.   HENT:   Head: Normocephalic and atraumatic.   Mouth/Throat: Oropharynx is clear and moist.   Eyes: Pupils are equal, round, and reactive to light. EOM are normal.   Neck: Normal range of motion. Neck supple.   Cardiovascular: Normal rate, regular rhythm, normal heart sounds and intact distal pulses. Exam reveals no gallop and no friction rub.   No murmur heard.  Pulmonary/Chest: Effort normal. No respiratory distress. He has no wheezes. He  has no rales.   Abdominal: Soft. There is no tenderness. There is no rebound.   Musculoskeletal: He exhibits no deformity.        Cervical back: He exhibits pain and spasm.   Mild tenderness to palpation over right trapezius distribution.   Neurological: He is alert. No cranial nerve deficit.   Motor; symmetric   Skin: No erythema.   Psychiatric: He has a normal mood and affect. His behavior is normal.   Nursing note and vitals reviewed.     Note written by Delfino LovettWhitney Hewitt, Scribe, as dictated by Raynald KempGill, Aspen Lawrance P, MD 6:59 AM    MDM       Procedures      7:13 AM  Patient's results have been reviewed with them.  Patient and/or family have verbally conveyed their understanding and agreement of the patient's signs, symptoms, diagnosis, treatment and prognosis and additionally agree to follow up as recommended or return to the Emergency Room should their condition change prior to follow-up.  Discharge instructions have also been provided to the patient with some educational information regarding their diagnosis as well a list of reasons why they would want to return to the ER prior to their follow-up appointment should their condition change.

## 2017-10-25 NOTE — ED Notes (Signed)
I have reviewed discharge instructions with the patient.  The patient verbalized understanding. VSS, respirations even and unlabored and in no acute distress. MD aware of patient's elevated BP, list of local primary care providers given for management.

## 2017-10-25 NOTE — ED Notes (Signed)
Pt arrives ambulatory from home with CC of Right sided neck pain and stiffness.  Pt reports he woke up about 4 days ago with neck pain and stiffness, pain radiates to the R shoulder. +Tender on palpation.  Patient denies injury.  Denies chest pain and shortness of breath, denies fevers.

## 2017-10-25 NOTE — ED Triage Notes (Addendum)
Pt arrives ambulatory from home with CC of Right sided neck pain and stiffness.  Pt reports he woke up about 4 days ago with neck pain and stiffness, pain radiates to the R shoulder. +Tender on palpation.  Patient denies injury.  Denies chest pain and shortness of breath, denies fevers.

## 2017-10-25 NOTE — ED Notes (Signed)
I have reviewed discharge instructions with the patient.  The patient verbalized understanding. VSS, respirations even and unlabored and in no acute distress. MD aware of patient's elevated BP, list of local primary care providers given for management.

## 2017-10-25 NOTE — ED Provider Notes (Signed)
57 y.o. male with past medical history significant for asthma who presents from home via private vehicle with chief complaint of neck pain. Patient reports 2-3 days of right neck pain radiating to his right shoulder and to his right posterior head. He thinks maybe he slept in a bad position. Patient reports taking Motrin at home without relief. Pain has gradually worsened, and now he reports decreased ROM of his neck 2/2 pain. Patient reports a h/o similar pain with a previous pulled trapeizus muscle. Patient denies any radiation down his arm. There are no other acute medical concerns at this time.    Social hx: Former smoker; No EtOH use    Note written by Delfino LovettWhitney Hewitt, Scribe, as dictated by Raynald KempGill, Cris Talavera P, MD 6:59 AM    The history is provided by the patient. No language interpreter was used.        Past Medical History:   Diagnosis Date   ??? Abdominal wall hernia    ??? Asthma    ??? High cholesterol        Past Surgical History:   Procedure Laterality Date   ??? ABDOMEN SURGERY PROC UNLISTED      hernia         History reviewed. No pertinent family history.    Social History     Socioeconomic History   ??? Marital status: WIDOWED     Spouse name: Not on file   ??? Number of children: Not on file   ??? Years of education: Not on file   ??? Highest education level: Not on file   Occupational History   ??? Not on file   Social Needs   ??? Financial resource strain: Not on file   ??? Food insecurity:     Worry: Not on file     Inability: Not on file   ??? Transportation needs:     Medical: Not on file     Non-medical: Not on file   Tobacco Use   ??? Smoking status: Former Smoker   ??? Smokeless tobacco: Never Used   Substance and Sexual Activity   ??? Alcohol use: No   ??? Drug use: No   ??? Sexual activity: Not on file   Lifestyle   ??? Physical activity:     Days per week: Not on file     Minutes per session: Not on file   ??? Stress: Not on file   Relationships   ??? Social connections:     Talks on phone: Not on file      Gets together: Not on file     Attends religious service: Not on file     Active member of club or organization: Not on file     Attends meetings of clubs or organizations: Not on file     Relationship status: Not on file   ??? Intimate partner violence:     Fear of current or ex partner: Not on file     Emotionally abused: Not on file     Physically abused: Not on file     Forced sexual activity: Not on file   Other Topics Concern   ??? Not on file   Social History Narrative   ??? Not on file         ALLERGIES: Bactrim [sulfamethoprim] and Ibuprofen    Review of Systems   Constitutional: Negative for appetite change, chills and fever.   HENT: Negative for rhinorrhea, sore throat and trouble swallowing.  Eyes: Negative for photophobia.   Respiratory: Negative for cough and shortness of breath.    Cardiovascular: Negative for chest pain and palpitations.   Gastrointestinal: Negative for abdominal pain, nausea and vomiting.   Genitourinary: Negative for dysuria, frequency and hematuria.   Musculoskeletal: Positive for arthralgias and neck pain. Negative for myalgias.   Neurological: Positive for headaches. Negative for dizziness, syncope and weakness.   Psychiatric/Behavioral: Negative for behavioral problems. The patient is not nervous/anxious.    All other systems reviewed and are negative.      Vitals:    10/25/17 0638 10/25/17 0644   BP:  (!) 161/102   Pulse: 80 75   Resp:  15   Temp:  97.7 ??F (36.5 ??C)   SpO2: 97% 97%            Physical Exam   Constitutional: He appears well-developed and well-nourished.   HENT:   Head: Normocephalic and atraumatic.   Mouth/Throat: Oropharynx is clear and moist.   Eyes: Pupils are equal, round, and reactive to light. EOM are normal.   Neck: Normal range of motion. Neck supple.   Cardiovascular: Normal rate, regular rhythm, normal heart sounds and intact distal pulses. Exam reveals no gallop and no friction rub.   No murmur heard.   Pulmonary/Chest: Effort normal. No respiratory distress. He has no wheezes. He has no rales.   Abdominal: Soft. There is no tenderness. There is no rebound.   Musculoskeletal: He exhibits no deformity.        Cervical back: He exhibits pain and spasm.   Mild tenderness to palpation over right trapezius distribution.   Neurological: He is alert. No cranial nerve deficit.   Motor; symmetric   Skin: No erythema.   Psychiatric: He has a normal mood and affect. His behavior is normal.   Nursing note and vitals reviewed.     Note written by Delfino Lovett, Scribe, as dictated by Raynald Kemp, MD 6:59 AM    MDM       Procedures      7:13 AM  Patient's results have been reviewed with them.  Patient and/or family have verbally conveyed their understanding and agreement of the patient's signs, symptoms, diagnosis, treatment and prognosis and additionally agree to follow up as recommended or return to the Emergency Room should their condition change prior to follow-up.  Discharge instructions have also been provided to the patient with some educational information regarding their diagnosis as well a list of reasons why they would want to return to the ER prior to their follow-up appointment should their condition change.

## 2018-09-20 ENCOUNTER — Emergency Department: Admit: 2018-09-20 | Payer: MEDICARE

## 2018-09-20 ENCOUNTER — Inpatient Hospital Stay
Admit: 2018-09-20 | Discharge: 2018-09-20 | Disposition: A | Discharge status: Home / Self Care | Payer: MEDICARE | Attending: Emergency Medicine

## 2018-09-20 DIAGNOSIS — S02609A Fracture of mandible, unspecified, initial encounter for closed fracture: Secondary | ICD-10-CM

## 2018-09-20 MED ORDER — HYDROCODONE-ACETAMINOPHEN 5 MG-325 MG TAB
5-325 mg | ORAL_TABLET | Freq: Four times a day (QID) | ORAL | 0 refills | Status: DC | PRN
Start: 2018-09-20 — End: 2018-09-20

## 2018-09-20 MED ORDER — HYDROCODONE-ACETAMINOPHEN 5 MG-325 MG TAB
5-325 mg | ORAL | Status: AC
Start: 2018-09-20 — End: 2018-09-20
  Administered 2018-09-20: 17:00:00 via ORAL

## 2018-09-20 MED ORDER — OXYCODONE-ACETAMINOPHEN 5 MG-325 MG TAB
5-325 mg | ORAL | Status: AC
Start: 2018-09-20 — End: 2018-09-20
  Administered 2018-09-20: 22:00:00 via ORAL

## 2018-09-20 MED ORDER — OXYCODONE-ACETAMINOPHEN 5 MG-325 MG TAB
5-325 mg | ORAL_TABLET | Freq: Four times a day (QID) | ORAL | 0 refills | Status: AC | PRN
Start: 2018-09-20 — End: 2018-09-23

## 2018-09-20 MED FILL — OXYCODONE-ACETAMINOPHEN 5 MG-325 MG TAB: 5-325 mg | ORAL | Qty: 1

## 2018-09-20 MED FILL — HYDROCODONE-ACETAMINOPHEN 5 MG-325 MG TAB: 5-325 mg | ORAL | Qty: 1
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
## 2018-09-20 NOTE — ED Notes (Signed)
Pt discharged with paperwork by provider, no acute signs of distress at time of discharge.
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
## 2019-08-28 ENCOUNTER — Encounter (HOSPITAL_COMMUNITY): Payer: Self-pay

## 2019-08-28 ENCOUNTER — Emergency Department (HOSPITAL_COMMUNITY)
Admission: EM | Admit: 2019-08-28 | Discharge: 2019-08-28 | Disposition: A | Discharge status: Home / Self Care | Payer: Medicare Other | Attending: Emergency Medicine | Admitting: Emergency Medicine

## 2019-08-28 ENCOUNTER — Other Ambulatory Visit: Payer: Self-pay

## 2019-08-28 DIAGNOSIS — Y929 Unspecified place or not applicable: Secondary | ICD-10-CM | POA: Insufficient documentation

## 2019-08-28 DIAGNOSIS — F17210 Nicotine dependence, cigarettes, uncomplicated: Secondary | ICD-10-CM | POA: Insufficient documentation

## 2019-08-28 DIAGNOSIS — W57XXXA Bitten or stung by nonvenomous insect and other nonvenomous arthropods, initial encounter: Secondary | ICD-10-CM | POA: Insufficient documentation

## 2019-08-28 DIAGNOSIS — R369 Urethral discharge, unspecified: Secondary | ICD-10-CM | POA: Diagnosis not present

## 2019-08-28 DIAGNOSIS — R350 Frequency of micturition: Secondary | ICD-10-CM | POA: Insufficient documentation

## 2019-08-28 DIAGNOSIS — Z202 Contact with and (suspected) exposure to infections with a predominantly sexual mode of transmission: Secondary | ICD-10-CM | POA: Insufficient documentation

## 2019-08-28 DIAGNOSIS — Y998 Other external cause status: Secondary | ICD-10-CM | POA: Insufficient documentation

## 2019-08-28 DIAGNOSIS — N4889 Other specified disorders of penis: Secondary | ICD-10-CM | POA: Insufficient documentation

## 2019-08-28 DIAGNOSIS — Y9389 Activity, other specified: Secondary | ICD-10-CM | POA: Insufficient documentation

## 2019-08-28 DIAGNOSIS — S30861A Insect bite (nonvenomous) of abdominal wall, initial encounter: Secondary | ICD-10-CM | POA: Diagnosis not present

## 2019-08-28 DIAGNOSIS — R519 Headache, unspecified: Secondary | ICD-10-CM | POA: Diagnosis not present

## 2019-08-28 HISTORY — DX: Anxiety disorder, unspecified: F41.9

## 2019-08-28 HISTORY — DX: Depression, unspecified: F32.A

## 2019-08-28 HISTORY — DX: Other tear of medial meniscus, current injury, unspecified knee, initial encounter: S83.249A

## 2019-08-28 LAB — HIV ANTIBODY (ROUTINE TESTING W REFLEX): HIV Screen 4th Generation wRfx: REACTIVE — AB

## 2019-08-28 MED ORDER — DOXYCYCLINE HYCLATE 100 MG PO CAPS
100.0000 mg | ORAL_CAPSULE | Freq: Two times a day (BID) | ORAL | 0 refills | Status: DC
Start: 2019-08-28 — End: 2019-10-17

## 2019-08-28 MED ORDER — CEFTRIAXONE SODIUM 1 G IJ SOLR
500.0000 mg | Freq: Once | INTRAMUSCULAR | Status: AC
  Administered 2019-08-28: 500 mg via INTRAMUSCULAR
  Filled 2019-08-28: qty 10
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
## 2019-08-28 NOTE — ED Triage Notes (Signed)
Patient reports that he had a tick bite on his abdomen 2 1/2weeks ago.  Patient has a raised area. Patient also c/o headache and joint pain.  Patient c/o penile burning x 2 1/2 weeks ago. Patient states "I need to have an STD check."

## 2019-08-29 LAB — URINE CULTURE: Culture: NO GROWTH

## 2019-08-29 LAB — RPR: RPR Ser Ql: NONREACTIVE

## 2019-08-30 LAB — HIV-1/2 AB - DIFFERENTIATION
HIV 1 Ab: POSITIVE — AB
HIV 2 Ab: NEGATIVE

## 2019-09-02 ENCOUNTER — Encounter: Payer: Self-pay | Admitting: Infectious Diseases

## 2019-09-02 ENCOUNTER — Telehealth: Payer: Self-pay | Admitting: Infectious Disease

## 2019-09-02 DIAGNOSIS — B20 Human immunodeficiency virus [HIV] disease: Secondary | ICD-10-CM | POA: Insufficient documentation

## 2019-09-02 DIAGNOSIS — Z21 Asymptomatic human immunodeficiency virus [HIV] infection status: Secondary | ICD-10-CM

## 2019-09-02 NOTE — Telephone Encounter (Signed)
Patient has HIV positive antibody can we send DIS to connect him to care

## 2019-09-02 NOTE — Telephone Encounter (Signed)
Referral faxed to [facility]. Andree Coss, RN

## 2019-09-02 NOTE — Telephone Encounter (Signed)
Thanks Michelle

## 2019-09-17 ENCOUNTER — Encounter: Payer: Self-pay | Admitting: Infectious Disease

## 2019-09-17 NOTE — Progress Notes (Signed)
Patient ID: Mike Harrison, male   DOB: Apr 08, 1960, 59 y.o.   MRN: [id number] Mike Harrison he advised at this time he is still going back and forth to [facility]  Their phone [phone]   He advised he is not ready to transfer when he decides he will call us to schedule

## 2019-09-17 NOTE — Telephone Encounter (Signed)
Per Melton Alar, patient has been positive since 1998, was previously in care in [place]. Will have Claris Che reach out to patient to offer appointment and obtain records from previous provider. Andree Coss, RN

## 2019-10-14 ENCOUNTER — Other Ambulatory Visit: Payer: Self-pay

## 2019-10-14 ENCOUNTER — Emergency Department (HOSPITAL_COMMUNITY)
Admission: EM | Admit: 2019-10-14 | Discharge: 2019-10-14 | Disposition: A | Discharge status: Home / Self Care | Payer: Medicare Other | Attending: Emergency Medicine | Admitting: Emergency Medicine

## 2019-10-14 ENCOUNTER — Encounter (HOSPITAL_COMMUNITY): Payer: Self-pay

## 2019-10-14 DIAGNOSIS — L299 Pruritus, unspecified: Secondary | ICD-10-CM | POA: Diagnosis not present

## 2019-10-14 DIAGNOSIS — Z5321 Procedure and treatment not carried out due to patient leaving prior to being seen by health care provider: Secondary | ICD-10-CM | POA: Insufficient documentation

## 2019-10-14 HISTORY — DX: Unspecified osteoarthritis, unspecified site: M19.90

## 2019-10-14 NOTE — ED Notes (Signed)
Called [facility] for room placement. Eloped from waiting area.  

## 2019-10-14 NOTE — ED Triage Notes (Signed)
Patient states he was bitten by a tick 1 1/2 months ago on his abdomen. Patient c/o itching and "feeling like something is crawling"

## 2019-10-16 ENCOUNTER — Emergency Department (HOSPITAL_COMMUNITY)
Admission: EM | Admit: 2019-10-16 | Discharge: 2019-10-17 | Disposition: A | Discharge status: Home / Self Care | Payer: Medicare Other | Attending: Emergency Medicine | Admitting: Emergency Medicine

## 2019-10-16 ENCOUNTER — Emergency Department (HOSPITAL_COMMUNITY)
Admission: EM | Admit: 2019-10-16 | Discharge: 2019-10-16 | Discharge status: Against Medical Advice | Payer: Medicare Other

## 2019-10-16 DIAGNOSIS — L089 Local infection of the skin and subcutaneous tissue, unspecified: Secondary | ICD-10-CM | POA: Insufficient documentation

## 2019-10-16 DIAGNOSIS — R21 Rash and other nonspecific skin eruption: Secondary | ICD-10-CM

## 2019-10-16 DIAGNOSIS — L299 Pruritus, unspecified: Secondary | ICD-10-CM | POA: Diagnosis present

## 2019-10-16 DIAGNOSIS — F17210 Nicotine dependence, cigarettes, uncomplicated: Secondary | ICD-10-CM | POA: Insufficient documentation

## 2019-10-17 ENCOUNTER — Encounter (HOSPITAL_COMMUNITY): Payer: Self-pay | Admitting: Emergency Medicine

## 2019-10-17 ENCOUNTER — Other Ambulatory Visit: Payer: Self-pay

## 2019-10-17 DIAGNOSIS — L089 Local infection of the skin and subcutaneous tissue, unspecified: Secondary | ICD-10-CM | POA: Diagnosis not present

## 2019-10-17 MED ORDER — LIDOCAINE-EPINEPHRINE (PF) 2 %-1:200000 IJ SOLN
10.0000 mL | Freq: Once | INTRAMUSCULAR | Status: AC
  Administered 2019-10-17: 10 mL
  Filled 2019-10-17: qty 20

## 2019-10-17 MED ORDER — DOXYCYCLINE HYCLATE 100 MG PO CAPS: 100.0000 mg | ORAL_CAPSULE | Freq: Two times a day (BID) | ORAL | 0 refills | Status: DC

## 2019-10-17 NOTE — ED Triage Notes (Signed)
Patient states that he had a tick on his stomach a month ago. Patient has an abscess on his abdomen. Patient states that he dont think the head is out of the abscess because he is itching all over his body.

## 2019-10-17 NOTE — Discharge Instructions (Addendum)
Take Doxycycline as prescribed, this time for 10 days. Recommend Benadryl for itching associated with rash. Tylenol and/or ibuprofen for aches and if you develop a fever.   Follow up with [facility] for primary care concerns.

## 2019-10-17 NOTE — ED Provider Notes (Signed)
[facility]-EMERGENCY DEPT Provider Note   CSN: [id number] Arrival date & time: 10/16/19  2333     History Chief Complaint  Patient presents with  . Tick Removal    a month ago    Mike Harrison is a 59 y.o. male.  Patient to ED with complaint of itching, sometimes painful rash surrounding a tick bite he received 6 weeks ago. He reports generalized fatigue and intermittent headache. No nausea or vomiting, no fever. No drainage from site.  The history is provided by the patient. No language interpreter was used.       Past Medical History:  Diagnosis Date  . Acute medial meniscus tear   . Anxiety   . Arthritis   . Depression     Patient Active Problem List   Diagnosis Date Noted  . HIV (human immunodeficiency virus infection) (HCC) 09/02/2019    Past Surgical History:  Procedure Laterality Date  . HERNIA REPAIR         Family History  Problem Relation Age of Onset  . Cancer Mother   . Hypertension Mother   . Cirrhosis Father     Social History   Tobacco Use  . Smoking status: Current Every Day Smoker    Packs/day: 0.50    Types: Cigarettes  . Smokeless tobacco: Never Used  Vaping Use  . Vaping Use: Never used  Substance Use Topics  . Alcohol use: Never  . Drug use: Never    Home Medications Prior to Admission medications   Medication Sig Start Date End Date Taking? Authorizing Provider  doxycycline (VIBRAMYCIN) 100 MG capsule Take 1 capsule (100 mg total) by mouth 2 (two) times daily. 08/28/19   Benjiman Core, MD    Allergies    Bactrim [sulfamethoxazole-trimethoprim]  Review of Systems   Review of Systems  Constitutional: Positive for fatigue. Negative for chills and fever.  HENT: Negative.   Respiratory: Negative for shortness of breath.   Gastrointestinal: Negative for nausea and vomiting.  Musculoskeletal: Negative for myalgias.  Skin: Positive for rash.  Neurological: Positive for headaches. Negative for  weakness.    Physical Exam Updated Vital Signs BP (!) 122/93 (BP Location: Right Arm)   Pulse 64   Temp 97.6 F (36.4 C) (Oral)   Resp 14   Ht 5\' 11"  (1.803 m)   Wt 102.1 kg   SpO2 97%   BMI 31.38 kg/m   Physical Exam Constitutional:      Appearance: He is well-developed.  Pulmonary:     Effort: Pulmonary effort is normal.  Chest:     Chest wall: Tenderness (Tender nodule to left upper chest, firm, mobile.) present.  Abdominal:     Tenderness: There is no abdominal tenderness.  Musculoskeletal:        General: Normal range of motion.     Cervical back: Normal range of motion. No rigidity.  Skin:    General: Skin is warm and dry.     Comments: Ulceration to LLQ abdominal wall with surround maculopapular rash without border, not c/w erythema migrans. Mild erythema. Small central induration without fluctuance.   Neurological:     Mental Status: He is alert and oriented to person, place, and time.     ED Results / Procedures / Treatments   Labs (all labs ordered are listed, but only abnormal results are displayed) Labs Reviewed - No data to display  EKG None  Radiology No results found.  Procedures . Incision and Drainage  Date/Time: 10/17/2019 4:24  AM Performed by: Elpidio Anis, PA-C Authorized by: Elpidio Anis, PA-C   Consent:    Consent obtained:  Verbal   Consent given by:  Patient Location:    Type:  Abscess   Size:  2   Location:  Trunk   Trunk location:  Abdomen Pre-procedure details:    Skin preparation:  Antiseptic wash Anesthesia (see MAR for exact dosages):    Anesthesia method:  Local infiltration   Local anesthetic:  Lidocaine 2% WITH epi Procedure type:    Complexity:  Simple Procedure details:    Needle aspiration: yes     Needle size:  18 G Comments:     No purulence on aspiration. No incision made.    (including critical care time)  Medications Ordered in ED Medications  lidocaine-EPINEPHrine (XYLOCAINE W/EPI) 2 %-1:200000  (PF) injection 10 mL (has no administration in time range)    ED Course  I have reviewed the triage vital signs and the nursing notes.  Pertinent labs & imaging results that were available during my care of the patient were reviewed by me and considered in my medical decision making (see chart for details).    MDM Rules/Calculators/A&P                          Patient to ED with tick bite 6 weeks ago and now having itching, pain to the site with surrounding rash.   The patient denies fever. Rash is not c/w erythema migrans - no border, rash is not confluent, raised singular lesions. Will attempt needle aspiration for purulent drainage, open if purulence found.   I will place him on another round of antibiotics to treat localized infection. He was given Doxycycline at diagnosis and reports he was compliant with the medication. Recommend benadryl to treat itching associated with rash.    Final Clinical Impression(s) / ED Diagnoses Final diagnoses:  None   1. Skin infection 2. Nonspecific rash  Rx / DC Orders ED Discharge Orders    None       Danne Harbor 10/17/19 0432    Palumbo, April, MD 10/17/19 [phone]

## 2019-10-25 ENCOUNTER — Other Ambulatory Visit: Payer: Self-pay

## 2019-10-25 ENCOUNTER — Emergency Department (HOSPITAL_COMMUNITY): Payer: Medicare Other

## 2019-10-25 ENCOUNTER — Encounter (HOSPITAL_COMMUNITY): Payer: Self-pay

## 2019-10-25 DIAGNOSIS — Z20822 Contact with and (suspected) exposure to covid-19: Secondary | ICD-10-CM | POA: Diagnosis not present

## 2019-10-25 DIAGNOSIS — Z7951 Long term (current) use of inhaled steroids: Secondary | ICD-10-CM | POA: Insufficient documentation

## 2019-10-25 DIAGNOSIS — J45909 Unspecified asthma, uncomplicated: Secondary | ICD-10-CM | POA: Diagnosis present

## 2019-10-25 DIAGNOSIS — F17210 Nicotine dependence, cigarettes, uncomplicated: Secondary | ICD-10-CM | POA: Diagnosis not present

## 2019-10-25 DIAGNOSIS — J45901 Unspecified asthma with (acute) exacerbation: Secondary | ICD-10-CM | POA: Insufficient documentation

## 2019-10-25 DIAGNOSIS — U071 COVID-19: Secondary | ICD-10-CM | POA: Insufficient documentation

## 2019-10-25 NOTE — ED Triage Notes (Signed)
Patient c/o asthma and a productive cough with white sputum since last night. Patient states he does not have an albuterol inhaler.  Patient reports that he has been taking Mucinex.

## 2019-10-26 ENCOUNTER — Emergency Department (HOSPITAL_COMMUNITY)
Admission: EM | Admit: 2019-10-26 | Discharge: 2019-10-26 | Disposition: A | Discharge status: Home / Self Care | Payer: Medicare Other | Attending: Emergency Medicine | Admitting: Emergency Medicine

## 2019-10-26 DIAGNOSIS — J45901 Unspecified asthma with (acute) exacerbation: Secondary | ICD-10-CM | POA: Diagnosis not present

## 2019-10-26 DIAGNOSIS — Z20822 Contact with and (suspected) exposure to covid-19: Secondary | ICD-10-CM

## 2019-10-26 DIAGNOSIS — J4521 Mild intermittent asthma with (acute) exacerbation: Secondary | ICD-10-CM

## 2019-10-26 MED ORDER — ALBUTEROL SULFATE HFA 108 (90 BASE) MCG/ACT IN AERS
8.0000 | INHALATION_SPRAY | Freq: Once | RESPIRATORY_TRACT | Status: AC
  Administered 2019-10-26: 8 via RESPIRATORY_TRACT
  Filled 2019-10-26: qty 6.7

## 2019-10-26 MED ORDER — AEROCHAMBER PLUS FLO-VU MEDIUM MISC
1.0000 | Freq: Once | Status: AC
  Administered 2019-10-26: 1
  Filled 2019-10-26: qty 1

## 2019-10-26 MED ORDER — PREDNISONE 20 MG PO TABS
40.0000 mg | ORAL_TABLET | Freq: Every day | ORAL | 0 refills | Status: DC
Start: 2019-10-26 — End: 2020-06-28

## 2019-10-26 MED ORDER — PREDNISONE 20 MG PO TABS
60.0000 mg | ORAL_TABLET | Freq: Once | ORAL | Status: AC
  Administered 2019-10-26: 60 mg via ORAL
  Filled 2019-10-26: qty 3

## 2019-10-26 MED ORDER — AZITHROMYCIN 250 MG PO TABS
ORAL_TABLET | ORAL | 0 refills | Status: DC
Start: 2019-10-26 — End: 2020-06-28

## 2019-10-26 MED ORDER — ALBUTEROL SULFATE HFA 108 (90 BASE) MCG/ACT IN AERS
2.0000 | INHALATION_SPRAY | RESPIRATORY_TRACT | 0 refills | Status: DC | PRN
Start: 2019-10-26 — End: 2020-06-28

## 2019-10-26 NOTE — Discharge Instructions (Signed)
1. Medications: albuterol, prednisone, azithromycin, usual home medications 2. Treatment: rest, drink plenty of fluids, begin OTC antihistamine (Zyrtec or Claritin)  3. Follow Up: Please followup with your primary doctor in 2-3 days for discussion of your diagnoses and further evaluation after today's visit; if you do not have a primary care doctor use the resource guide provided to find one; Please return to the ER for difficulty breathing, high fevers or worsening symptoms.

## 2019-10-26 NOTE — ED Provider Notes (Signed)
[facility]-EMERGENCY DEPT Provider Note   CSN: [id number] Arrival date & time: 10/25/19  1835     History Chief Complaint  Patient presents with  . Asthma  . Cough    Mike Harrison is a 59 y.o. male with a hx of HIV, asthma presents to the Emergency Department complaining of gradual, persistent, progressively worsening URI symptoms and asthma exacerbation onset 2 days ago.  Reports he has had nasal congestion, postnasal drip, adductive cough, shortness of breath, wheezing.  Patient is not vaccinated for Covid.  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  Patient denies fever, chills, neck stiffness, chest pain, weakness, dizziness, syncope.  Patient reports that when he has an asthma exacerbation like this he improves only with steroids, azithromycin and albuterol.  He is requesting the same today.  Patient reports that exertion worsens the shortness of breath but does not induce chest pain.  Over-the-counter medications have not improved his symptoms over the last several days.  The history is provided by the patient and medical records. No language interpreter was used.       Past Medical History:  Diagnosis Date  . Acute medial meniscus tear   . Anxiety   . Arthritis   . Depression     Patient Active Problem List   Diagnosis Date Noted  . HIV (human immunodeficiency virus infection) (HCC) 09/02/2019    Past Surgical History:  Procedure Laterality Date  . HERNIA REPAIR         Family History  Problem Relation Age of Onset  . Cancer Mother   . Hypertension Mother   . Cirrhosis Father     Social History   Tobacco Use  . Smoking status: Current Every Day Smoker    Packs/day: 0.50    Types: Cigarettes  . Smokeless tobacco: Never Used  Vaping Use  . Vaping Use: Never used  Substance Use Topics  . Alcohol use: Never  . Drug use: Never    Home Medications Prior to Admission medications   Medication Sig Start Date  End Date Taking? Authorizing Provider  albuterol (VENTOLIN HFA) 108 (90 Base) MCG/ACT inhaler Inhale 2 puffs into the lungs every 2 (two) hours as needed for wheezing or shortness of breath (cough). 10/26/19   Alejandrina Raimer, Dahlia Client, PA-C  azithromycin (ZITHROMAX) 250 MG tablet Take 500mg  (2 tablets) on day 1 and 250mg  (1 tablet) once daily on days 2 to 5 10/26/19   Chalsey Leeth, , PA-C  doxycycline (VIBRAMYCIN) 100 MG capsule Take 1 capsule (100 mg total) by mouth 2 (two) times daily. 10/17/19   Dahlia Client, PA-C  predniSONE (DELTASONE) 20 MG tablet Take 2 tablets (40 mg total) by mouth daily. 10/26/19   Joanathan Affeldt, Elpidio Anis, PA-C    Allergies    Bactrim [sulfamethoxazole-trimethoprim]  Review of Systems   Review of Systems  Constitutional: Positive for fatigue. Negative for appetite change, diaphoresis, fever and unexpected weight change.  HENT: Positive for congestion and postnasal drip. Negative for mouth sores.   Eyes: Negative for visual disturbance.  Respiratory: Positive for cough, chest tightness, shortness of breath and wheezing.   Cardiovascular: Negative for chest pain.  Gastrointestinal: Negative for abdominal pain, constipation, diarrhea, nausea and vomiting.  Endocrine: Negative for polydipsia, polyphagia and polyuria.  Genitourinary: Negative for dysuria, frequency, hematuria and urgency.  Musculoskeletal: Negative for back pain and neck stiffness.  Skin: Negative for rash.  Allergic/Immunologic: Negative for immunocompromised state.  Neurological: Negative for syncope, light-headedness and headaches.  Hematological: Does not bruise/bleed easily.  Psychiatric/Behavioral: Negative for sleep disturbance. The patient is not nervous/anxious.     Physical Exam Updated Vital Signs BP (!) 155/82 (BP Location: Right Arm)   Pulse 80   Temp 97.6 F (36.4 C) (Oral)   Resp 18   Ht 5\' 11"  (1.803 m)   Wt (!) 102.1 kg   SpO2 97%   BMI 31.38 kg/m   Physical Exam Vitals  and nursing note reviewed.  Constitutional:      General: He is not in acute distress.    Appearance: He is not diaphoretic.  HENT:     Head: Normocephalic.  Eyes:     General: No scleral icterus.    Conjunctiva/sclera: Conjunctivae normal.  Cardiovascular:     Rate and Rhythm: Normal rate and regular rhythm.     Pulses: Normal pulses.          Radial pulses are 2+ on the right side and 2+ on the left side.  Pulmonary:     Effort: Tachypnea and accessory muscle usage present. No prolonged expiration, respiratory distress or retractions.     Breath sounds: No stridor. Wheezing ( throughout) present.     Comments: Equal chest rise. Mild increased work of breathing. Abdominal:     General: There is no distension.     Palpations: Abdomen is soft.     Tenderness: There is no abdominal tenderness. There is no guarding or rebound.  Musculoskeletal:     Cervical back: Normal range of motion.     Comments: Moves all extremities equally and without difficulty.  Skin:    General: Skin is warm and dry.     Capillary Refill: Capillary refill takes less than 2 seconds.  Neurological:     Mental Status: He is alert.     GCS: GCS eye subscore is 4. GCS verbal subscore is 5. GCS motor subscore is 6.     Comments: Speech is clear and goal oriented.  Psychiatric:        Mood and Affect: Mood normal.     ED Results / Procedures / Treatments   Labs (all labs ordered are listed, but only abnormal results are displayed) Labs Reviewed  SARS CORONAVIRUS 2 (TAT 6-24 HRS)     Radiology DG Chest 2 View  Result Date: 10/25/2019 CLINICAL DATA:  59 year old male with cough. EXAM: CHEST - 2 VIEW COMPARISON:  None. FINDINGS: The heart size and mediastinal contours are within normal limits. Both lungs are clear. The visualized skeletal structures are unremarkable. IMPRESSION: No active cardiopulmonary disease. Electronically Signed   By: 60 M.D.   On: 10/25/2019 23:21     Procedures Procedures (including critical care time)  Medications Ordered in ED Medications  albuterol (VENTOLIN HFA) 108 (90 Base) MCG/ACT inhaler 8 puff (8 puffs Inhalation Given 10/26/19 0218)  AeroChamber Plus Flo-Vu Medium MISC 1 each (1 each Other Given 10/26/19 0219)  predniSONE (DELTASONE) tablet 60 mg (60 mg Oral Given 10/26/19 0218)    ED Course  I have reviewed the triage vital signs and the nursing notes.  Pertinent labs & imaging results that were available during my care of the patient were reviewed by me and considered in my medical decision making (see chart for details).    MDM Rules/Calculators/A&P                          Patient presents with complaints of asthma exacerbation.  Wheezing on  exam.  No hypoxia.  Mild sensory muscle usage.  Will treat as asthma exacerbation however significant concern for Covid as patient is unvaccinated and works on the lung a large number of people.  Covid test pending.  X-ray without pneumonia or groundglass opacities.  I personally evaluated these images.  Will give albuterol MDI and reassess.  2:45 AM Significant improvement in breath sounds after albuterol MDI.  Covid test pending.  Patient will need close follow-up with primary care physician.  Discussed reasons to return to the emergency department.  Patient states understanding and is in agreement with this plan.   Final Clinical Impression(s) / ED Diagnoses Final diagnoses:  Mild intermittent asthma with exacerbation  Suspected COVID-19 virus infection    Rx / DC Orders ED Discharge Orders         Ordered    predniSONE (DELTASONE) 20 MG tablet  Daily     Discontinue  Reprint     10/26/19 0245    azithromycin (ZITHROMAX) 250 MG tablet     Discontinue  Reprint     10/26/19 0245    albuterol (VENTOLIN HFA) 108 (90 Base) MCG/ACT inhaler  Every 2 hours PRN     Discontinue  Reprint     10/26/19 0245           Hoa Briggs, Boyd Kerbs 10/26/19 0246    Palumbo,  April, MD 10/26/19 [id number]

## 2019-10-28 LAB — SARS CORONAVIRUS 2 (TAT 6-24 HRS): SARS Coronavirus 2: NEGATIVE

## 2020-05-12 ENCOUNTER — Ambulatory Visit: Payer: Medicare Other | Admitting: *Deleted

## 2020-05-12 DIAGNOSIS — Z23 Encounter for immunization: Secondary | ICD-10-CM | POA: Diagnosis not present

## 2020-05-12 NOTE — Progress Notes (Signed)
Patient presents for vaccine injection today. Patient tolerated injection well and was observed without any concerns.  

## 2020-06-28 ENCOUNTER — Encounter (HOSPITAL_COMMUNITY): Payer: Self-pay

## 2020-06-28 ENCOUNTER — Emergency Department (HOSPITAL_COMMUNITY)
Admission: EM | Admit: 2020-06-28 | Discharge: 2020-06-28 | Disposition: A | Discharge status: Home / Self Care | Payer: Medicare Other | Attending: Emergency Medicine | Admitting: Emergency Medicine

## 2020-06-28 ENCOUNTER — Emergency Department (HOSPITAL_COMMUNITY): Payer: Medicare Other

## 2020-06-28 ENCOUNTER — Other Ambulatory Visit: Payer: Self-pay

## 2020-06-28 DIAGNOSIS — Z21 Asymptomatic human immunodeficiency virus [HIV] infection status: Secondary | ICD-10-CM | POA: Diagnosis not present

## 2020-06-28 DIAGNOSIS — Z20822 Contact with and (suspected) exposure to covid-19: Secondary | ICD-10-CM | POA: Insufficient documentation

## 2020-06-28 DIAGNOSIS — J029 Acute pharyngitis, unspecified: Secondary | ICD-10-CM | POA: Diagnosis present

## 2020-06-28 DIAGNOSIS — R059 Cough, unspecified: Secondary | ICD-10-CM

## 2020-06-28 DIAGNOSIS — F17210 Nicotine dependence, cigarettes, uncomplicated: Secondary | ICD-10-CM | POA: Diagnosis not present

## 2020-06-28 DIAGNOSIS — Z79899 Other long term (current) drug therapy: Secondary | ICD-10-CM | POA: Insufficient documentation

## 2020-06-28 DIAGNOSIS — J40 Bronchitis, not specified as acute or chronic: Secondary | ICD-10-CM | POA: Diagnosis not present

## 2020-06-28 MED ORDER — ALBUTEROL SULFATE HFA 108 (90 BASE) MCG/ACT IN AERS
2.0000 | INHALATION_SPRAY | RESPIRATORY_TRACT | 0 refills | Status: DC | PRN
Start: 2020-06-28 — End: 2020-12-31

## 2020-06-28 MED ORDER — PREDNISONE 50 MG PO TABS
50.0000 mg | ORAL_TABLET | Freq: Every day | ORAL | 0 refills | Status: DC
Start: 2020-06-28 — End: 2021-02-12

## 2020-06-28 MED ORDER — AZITHROMYCIN 250 MG PO TABS
ORAL_TABLET | ORAL | 0 refills | Status: DC
Start: 2020-06-28 — End: 2021-02-12

## 2020-06-28 NOTE — ED Triage Notes (Signed)
Pt reports sore throat x3 days. Pt reports hx of asthma and report having some wheezing.

## 2020-06-28 NOTE — Discharge Instructions (Addendum)
Return if any problems.  Schedule appointment for evaluation

## 2020-06-28 NOTE — ED Provider Notes (Signed)
[facility]-EMERG[facility] DEPT Provider Note   CSN: [id number] Arrival date & time: 06/28/20  1046     History Chief Complaint  Patient presents with  . Sore Throat    Mike Harrison is a 60 y.o. male.  The history is provided by the patient. No language interpreter was used.  Sore Throat This is a new problem. The problem occurs constantly. Nothing aggravates the symptoms. Nothing relieves the symptoms. He has tried nothing for the symptoms. The treatment provided moderate relief.  Pt complains of shortness of breath and cough.  Pt has a history of asthma.  He is using proventil.  Pt reports he has been out of his     Past Medical History:  Diagnosis Date  . Acute medial meniscus tear   . Anxiety   . Arthritis   . Depression     Patient Active Problem List   Diagnosis Date Noted  . HIV (human immunodeficiency virus infection) (HCC) 09/02/2019    Past Surgical History:  Procedure Laterality Date  . HERNIA REPAIR         Family History  Problem Relation Age of Onset  . Cancer Mother   . Hypertension Mother   . Cirrhosis Father     Social History   Tobacco Use  . Smoking status: Current Every Day Smoker    Packs/day: 0.50    Types: Cigarettes  . Smokeless tobacco: Never Used  Vaping Use  . Vaping Use: Never used  Substance Use Topics  . Alcohol use: Never  . Drug use: Never    Home Medications Prior to Admission medications   Medication Sig Start Date End Date Taking? Authorizing Provider  albuterol (VENTOLIN HFA) 108 (90 Base) MCG/ACT inhaler Inhale 2 puffs into the lungs every 2 (two) hours as needed for wheezing or shortness of breath (cough). 10/26/19   Muthersbaugh, Dahlia Client, PA-C  azithromycin (ZITHROMAX) 250 MG tablet Take 500mg  (2 tablets) on day 1 and 250mg  (1 tablet) once daily on days 2 to 5 10/26/19   Muthersbaugh, , PA-C  doxycycline (VIBRAMYCIN) 100 MG capsule Take 1 capsule (100 mg total) by mouth 2 (two) times daily.  10/17/19   Dahlia Client, PA-C  predniSONE (DELTASONE) 20 MG tablet Take 2 tablets (40 mg total) by mouth daily. 10/26/19   Muthersbaugh, Elpidio Anis, PA-C    Allergies    Bactrim [sulfamethoxazole-trimethoprim]  Review of Systems   Review of Systems  All other systems reviewed and are negative.   Physical Exam Updated Vital Signs BP (!) 142/88 (BP Location: Left Arm)   Pulse 90   Temp 98.2 F (36.8 C) (Oral)   Resp 16   SpO2 99%   Physical Exam Vitals and nursing note reviewed.  Constitutional:      Appearance: He is well-developed.  HENT:     Head: Normocephalic and atraumatic.  Eyes:     Conjunctiva/sclera: Conjunctivae normal.  Cardiovascular:     Rate and Rhythm: Normal rate and regular rhythm.     Heart sounds: No murmur heard.   Pulmonary:     Effort: Pulmonary effort is normal. No respiratory distress.     Breath sounds: Normal breath sounds.  Abdominal:     Palpations: Abdomen is soft.     Tenderness: There is no abdominal tenderness.  Musculoskeletal:     Cervical back: Neck supple.  Skin:    General: Skin is warm and dry.  Neurological:     General: No focal deficit present.  Mental Status: He is alert.  Psychiatric:        Mood and Affect: Mood normal.     ED Results / Procedures / Treatments   Labs (all labs ordered are listed, but only abnormal results are displayed) Labs Reviewed  SARS CORONAVIRUS 2 (TAT 6-24 HRS)    EKG None  Radiology DG Chest 2 View  Result Date: 06/28/2020 CLINICAL DATA:  Productive cough, wheezing EXAM: CHEST - 2 VIEW COMPARISON:  10/25/2019 chest radiograph. FINDINGS: Stable cardiomediastinal silhouette with normal heart size. No pneumothorax. No pleural effusion. Lungs appear clear, with no acute consolidative airspace disease and no pulmonary edema. IMPRESSION: No active cardiopulmonary disease. Electronically Signed   By: Delbert Phenix M.D.   On: 06/28/2020 12:14    Procedures Procedures   Medications Ordered  in ED Medications - No data to display  ED Course  I have reviewed the triage vital signs and the nursing notes.  Pertinent labs & imaging results that were available during my care of the patient were reviewed by me and considered in my medical decision making (see chart for details).    MDM Rules/Calculators/A&P                          MDM:  Pt has HIV  He is currently off his medications.   Pt reports he is no longer followed by [place] [facility].   Final Clinical Impression(s) / ED Diagnoses Final diagnoses:  Cough  Bronchitis    Rx / DC Orders ED Discharge Orders         Ordered    albuterol (VENTOLIN HFA) 108 (90 Base) MCG/ACT inhaler  Every 2 hours PRN        06/28/20 1256    predniSONE (DELTASONE) 50 MG tablet  Daily        06/28/20 1256    azithromycin (ZITHROMAX) 250 MG tablet        06/28/20 1256        An After Visit Summary was printed and given to the patient.   Elson Areas, [place] 06/28/20 1259    Terald Sleeper, MD 06/28/20 [phone]

## 2020-06-29 LAB — SARS CORONAVIRUS 2 (TAT 6-24 HRS): SARS Coronavirus 2: NEGATIVE

## 2020-12-11 ENCOUNTER — Other Ambulatory Visit (HOSPITAL_COMMUNITY): Payer: Self-pay

## 2020-12-16 ENCOUNTER — Telehealth: Payer: Self-pay

## 2020-12-16 NOTE — Telephone Encounter (Signed)
   Tajuan Dufault DOB: 03-30-1960 MRN: [id number]   RIDER WAIVER AND RELEASE OF LIABILITY  For purposes of improving physical access to our facilities, [facility] is pleased to partner with third parties to provide [facility] patients or other authorized individuals the option of convenient, on-demand ground transportation services (the Chiropractor") through use of the technology service that enables users to request on-demand ground transportation from independent third-party providers.  By opting to use and accept these [employer], I, the undersigned, hereby agree on behalf of myself, and on behalf of any minor child using the Science writer for whom I am the parent or legal guardian, as follows:  Science writer provided to me are provided by independent third-party transportation providers who are not [employer] or employees and who are unaffiliated with [employer]. [facility] is neither a transportation carrier nor a common or public carrier. [facility] has no control over the quality or safety of the transportation that occurs as a result of the [employer]. [facility] cannot guarantee that any third-party transportation provider will complete any arranged transportation service. [facility] makes no representation, warranty, or guarantee regarding the reliability, timeliness, quality, safety, suitability, or availability of any of the Transport Services or that they will be error free. I fully understand that traveling by vehicle involves risks and dangers of serious bodily injury, including permanent disability, paralysis, and death. I agree, on behalf of myself and on behalf of any minor child using the Transport Services for whom I am the parent or legal guardian, that the entire risk arising out of my use of the [employer] remains solely with me, to the maximum extent permitted under applicable law. The [employer] are provided "as is"  and "as available." [facility] disclaims all representations and warranties, express, implied or statutory, not expressly set out in these terms, including the implied warranties of merchantability and fitness for a particular purpose. I hereby waive and release [facility], its agents, employees, officers, directors, representatives, insurers, attorneys, assigns, successors, subsidiaries, and affiliates from any and all past, present, or future claims, demands, liabilities, actions, causes of action, or suits of any kind directly or indirectly arising from acceptance and use of the [employer]. I further waive and release [facility] and its affiliates from all present and future liability and responsibility for any injury or death to persons or damages to property caused by or related to the use of the [employer]. I have read this Waiver and Release of Liability, and I understand the terms used in it and their legal significance. This Waiver is freely and voluntarily given with the understanding that my right (as well as the right of any minor child for whom I am the parent or legal guardian using the [employer]) to legal recourse against  in connection with the [employer] is knowingly surrendered in return for use of these services.   I attest that I read the consent document to [name], gave Mr. Hennes the opportunity to ask questions and answered the questions asked (if any). I affirm that [name] then provided consent for he's participation in this program.     Farley Ly

## 2020-12-17 ENCOUNTER — Other Ambulatory Visit: Payer: Self-pay

## 2020-12-17 ENCOUNTER — Ambulatory Visit: Payer: Medicare Other

## 2020-12-17 ENCOUNTER — Telehealth: Payer: Self-pay

## 2020-12-17 ENCOUNTER — Other Ambulatory Visit: Payer: Medicare Other

## 2020-12-17 ENCOUNTER — Other Ambulatory Visit (HOSPITAL_COMMUNITY)
Admission: RE | Admit: 2020-12-17 | Discharge: 2020-12-17 | Disposition: A | Discharge status: Home / Self Care | Payer: Medicare Other | Source: Ambulatory Visit | Attending: Infectious Disease | Admitting: Infectious Disease

## 2020-12-17 DIAGNOSIS — B20 Human immunodeficiency virus [HIV] disease: Secondary | ICD-10-CM | POA: Insufficient documentation

## 2020-12-17 NOTE — Telephone Encounter (Signed)
[facility] Patient Advocate Encounter   I was successful in securing patient a $ 7500.00 grant from [employer] to provide copayment coverage for Triumeq.  The patient's out of pocket cost will be 0.00 monthly.     I have spoken with the patient.          Dates of Eligibility: 12/17/20 through 03/27/21  Patient knows to call the office with questions or concerns.  Clearance Coots, CPhT Specialty Pharmacy Patient [facility] Phone: [phone] Fax:  [phone]

## 2020-12-18 LAB — URINALYSIS
Bilirubin Urine: NEGATIVE
Glucose, UA: NEGATIVE
Hgb urine dipstick: NEGATIVE
Ketones, ur: NEGATIVE
Nitrite: NEGATIVE
Protein, ur: NEGATIVE
Specific Gravity, Urine: 1.02 (ref 1.001–1.035)
pH: 6.5 (ref 5.0–8.0)

## 2020-12-18 LAB — URINE CYTOLOGY ANCILLARY ONLY
Chlamydia: NEGATIVE
Comment: NEGATIVE
Comment: NORMAL
Neisseria Gonorrhea: NEGATIVE

## 2020-12-18 LAB — T-HELPER CELL (CD4) - (RCID CLINIC ONLY)
CD4 % Helper T Cell: 11 % — ABNORMAL LOW (ref 33–65)
CD4 T Cell Abs: 198 /uL — ABNORMAL LOW (ref 400–1790)

## 2020-12-26 LAB — CBC WITH DIFFERENTIAL/PLATELET
Absolute Monocytes: 702 cells/uL (ref 200–950)
Basophils Absolute: 30 cells/uL (ref 0–200)
Basophils Relative: 0.5 %
Eosinophils Absolute: 89 cells/uL (ref 15–500)
Eosinophils Relative: 1.5 %
HCT: 43 % (ref 38.5–50.0)
Hemoglobin: 14.8 g/dL (ref 13.2–17.1)
Lymphs Abs: 2106 cells/uL (ref 850–3900)
MCH: 31.2 pg (ref 27.0–33.0)
MCHC: 34.4 g/dL (ref 32.0–36.0)
MCV: 90.5 fL (ref 80.0–100.0)
MPV: 10.9 fL (ref 7.5–12.5)
Monocytes Relative: 11.9 %
Neutro Abs: 2974 cells/uL (ref 1500–7800)
Neutrophils Relative %: 50.4 %
Platelets: 280 10*3/uL (ref 140–400)
RBC: 4.75 10*6/uL (ref 4.20–5.80)
RDW: 12.8 % (ref 11.0–15.0)
Total Lymphocyte: 35.7 %
WBC: 5.9 10*3/uL (ref 3.8–10.8)

## 2020-12-26 LAB — COMPLETE METABOLIC PANEL WITH GFR
AG Ratio: 1 (calc) (ref 1.0–2.5)
ALT: 22 U/L (ref 9–46)
AST: 20 U/L (ref 10–35)
Albumin: 4.1 g/dL (ref 3.6–5.1)
Alkaline phosphatase (APISO): 60 U/L (ref 35–144)
BUN: 12 mg/dL (ref 7–25)
CO2: 27 mmol/L (ref 20–32)
Calcium: 9.1 mg/dL (ref 8.6–10.3)
Chloride: 104 mmol/L (ref 98–110)
Creat: 1.14 mg/dL (ref 0.70–1.30)
Globulin: 4.1 g/dL (calc) — ABNORMAL HIGH (ref 1.9–3.7)
Glucose, Bld: 89 mg/dL (ref 65–99)
Potassium: 4.3 mmol/L (ref 3.5–5.3)
Sodium: 136 mmol/L (ref 135–146)
Total Bilirubin: 1.1 mg/dL (ref 0.2–1.2)
Total Protein: 8.2 g/dL — ABNORMAL HIGH (ref 6.1–8.1)
eGFR: 75 mL/min/{1.73_m2} (ref >=60)

## 2020-12-26 LAB — RPR: RPR Ser Ql: NONREACTIVE

## 2020-12-26 LAB — HEPATITIS B SURFACE ANTIGEN: Hepatitis B Surface Ag: NONREACTIVE

## 2020-12-26 LAB — HEPATITIS C ANTIBODY
Hepatitis C Ab: NONREACTIVE
SIGNAL TO CUT-OFF: 0.08 (ref <1.00)

## 2020-12-26 LAB — HLA B*5701: HLA-B*5701 w/rflx HLA-B High: NEGATIVE

## 2020-12-26 LAB — QUANTIFERON-TB GOLD PLUS
Mitogen-NIL: >10 IU/mL
NIL: 0.06 IU/mL
QuantiFERON-TB Gold Plus: NEGATIVE
TB1-NIL: <0 IU/mL
TB2-NIL: 0.04 IU/mL

## 2020-12-26 LAB — HEPATITIS B SURFACE ANTIBODY,QUALITATIVE: Hep B S Ab: REACTIVE — AB

## 2020-12-26 LAB — HIV-1 RNA ULTRAQUANT REFLEX TO GENTYP+
HIV 1 RNA Quant: 46700 copies/mL — ABNORMAL HIGH
HIV-1 RNA Quant, Log: 4.67 Log copies/mL — ABNORMAL HIGH

## 2020-12-26 LAB — HEPATITIS B CORE ANTIBODY, TOTAL: Hep B Core Total Ab: NONREACTIVE

## 2020-12-26 LAB — HEPATITIS A ANTIBODY, TOTAL: Hepatitis A AB,Total: REACTIVE — AB

## 2020-12-26 LAB — HIV-1 GENOTYPE: HIV-1 Genotype: DETECTED — AB

## 2020-12-28 ENCOUNTER — Encounter: Payer: Self-pay | Admitting: Infectious Disease

## 2020-12-31 ENCOUNTER — Other Ambulatory Visit (HOSPITAL_COMMUNITY): Payer: Self-pay

## 2020-12-31 ENCOUNTER — Other Ambulatory Visit: Payer: Self-pay

## 2020-12-31 ENCOUNTER — Ambulatory Visit (INDEPENDENT_AMBULATORY_CARE_PROVIDER_SITE_OTHER): Payer: Medicare Other | Admitting: Infectious Disease

## 2020-12-31 ENCOUNTER — Encounter: Payer: Self-pay | Admitting: Infectious Disease

## 2020-12-31 VITALS — BP 154/92 | HR 83 | Temp 98.2°F | Wt 208.0 lb

## 2020-12-31 DIAGNOSIS — T7840XA Allergy, unspecified, initial encounter: Secondary | ICD-10-CM

## 2020-12-31 DIAGNOSIS — B20 Human immunodeficiency virus [HIV] disease: Secondary | ICD-10-CM | POA: Diagnosis not present

## 2020-12-31 DIAGNOSIS — G4700 Insomnia, unspecified: Secondary | ICD-10-CM

## 2020-12-31 DIAGNOSIS — E785 Hyperlipidemia, unspecified: Secondary | ICD-10-CM

## 2020-12-31 DIAGNOSIS — Z23 Encounter for immunization: Secondary | ICD-10-CM | POA: Diagnosis not present

## 2020-12-31 DIAGNOSIS — F17200 Nicotine dependence, unspecified, uncomplicated: Secondary | ICD-10-CM

## 2020-12-31 DIAGNOSIS — J4521 Mild intermittent asthma with (acute) exacerbation: Secondary | ICD-10-CM | POA: Diagnosis not present

## 2020-12-31 DIAGNOSIS — F1421 Cocaine dependence, in remission: Secondary | ICD-10-CM

## 2020-12-31 DIAGNOSIS — Z7185 Encounter for immunization safety counseling: Secondary | ICD-10-CM

## 2020-12-31 DIAGNOSIS — I10 Essential (primary) hypertension: Secondary | ICD-10-CM | POA: Diagnosis not present

## 2020-12-31 HISTORY — DX: Essential (primary) hypertension: I10

## 2020-12-31 HISTORY — DX: Hyperlipidemia, unspecified: E78.5

## 2020-12-31 MED ORDER — ALBUTEROL SULFATE HFA 108 (90 BASE) MCG/ACT IN AERS
2.0000 | INHALATION_SPRAY | RESPIRATORY_TRACT | 4 refills | Status: DC | PRN
  Filled 2020-12-31 (×2): qty 18, 17d supply, fill #0
  Filled 2021-02-23: qty 18, 17d supply, fill #1
  Filled 2021-05-05: qty 18, 17d supply, fill #2
  Filled 2021-06-03: qty 18, 9d supply, fill #3

## 2020-12-31 MED ORDER — BIKTARVY 50-200-25 MG PO TABS
1.0000 | ORAL_TABLET | Freq: Every day | ORAL | 11 refills | Status: DC
  Filled 2020-12-31 (×2): qty 30, 30d supply, fill #0
  Filled 2021-01-22: qty 30, 30d supply, fill #1
  Filled 2021-02-23: qty 30, 30d supply, fill #2
  Filled 2021-03-26: qty 30, 30d supply, fill #3
  Filled 2021-05-05: qty 30, 30d supply, fill #4
  Filled 2021-06-03: qty 30, 30d supply, fill #5
  Filled 2021-09-03: qty 30, 30d supply, fill #6
  Filled 2021-09-29: qty 30, 30d supply, fill #7
  Filled 2021-11-01: qty 30, 30d supply, fill #8

## 2020-12-31 MED ORDER — MIRTAZAPINE 15 MG PO TABS
15.0000 mg | ORAL_TABLET | Freq: Every day | ORAL | 5 refills | Status: DC
  Filled 2020-12-31 (×2): qty 30, 30d supply, fill #0
  Filled 2021-01-22: qty 30, 30d supply, fill #1
  Filled 2021-02-23: qty 30, 30d supply, fill #2
  Filled 2021-05-05: qty 30, 30d supply, fill #3
  Filled 2021-06-03: qty 30, 30d supply, fill #4

## 2020-12-31 NOTE — Progress Notes (Signed)
Subjective:  Chief complaints: Recent upper respiratory tract infection with some residual wheezing as well as itching at multiple spots in his skin.    Patient ID: Mike Harrison, male    DOB: [birth year], 60 y.o.   MRN: [id number]  HPI  Mike Harrison is a 60 year old black man diagnosed with HIV and AIDS in 2007.  He does not recall a specific opportunistic infection though he remembers being on Bactrim for PCP prophylaxis when he developed a rash.  In the past when he has had a CD4 count below 200 he has been on dapsone typically.  He was taken care of at [facility] clinic recently and had been on TRIUMEQ.  Past antiviral history per the patient included Sustiva and Truvada followed by some other medications with Truvada which he cannot recall.  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  He has been off medications for nearly 9 months viral load was in the 40,000 range and CD4 count at 198.  He has had comorbid hypertension hyperlipidemia and asthma.  He says that the hyperlipidemia and hypertension have resolved when he is lost weight.  He had some intolerance to different antihypertensives including apparent scrotal swelling on thiazide diuretic and an ACE inhibitor induced cough.  He has noticed some skin lesions after having been bitten by several ticks a year ago.  He has not had fevers chills or systemic symptoms there is no history of any erythema migrans type rash.  He does have several areas 1 on his ankle also 1 on his torso and one near his armpit that he says itch intensely in which she scratches he also has what he thinks is a lymph node in his axillary area.    Past Medical History:  Diagnosis Date   Acute medial meniscus tear    Anxiety    Arthritis    Depression    Hyperlipidemia 12/31/2020   Hypertension 12/31/2020     Past Surgical History:  Procedure Laterality Date   HERNIA REPAIR      Family History  Problem Relation Age of Onset   Cancer Mother    Hypertension Mother    Cirrhosis Father       Social History   Socioeconomic History   Marital status: Widowed    Spouse name: Not on file   Number of children: Not on file   Years of education: Not on file   Highest education level: Not on file  Occupational History   Not on file  Tobacco Use   Smoking status: Every Day    Packs/day: 0.50    Types: Cigarettes   Smokeless tobacco: Never  Vaping Use   Vaping Use: Never used  Substance and Sexual Activity   Alcohol use: Never   Drug use: Never   Sexual activity: Not on file  Other Topics Concern   Not on file  Social History Narrative   Not on file   Social Determinants of Health   Financial Resource Strain: Not on file  Food Insecurity: Not on file  Transportation Needs: Not on file  Physical Activity: Not on file  Stress: Not on file  Social Connections: Not on file    Allergies  Allergen Reactions   Bactrim [Sulfamethoxazole-Trimethoprim]      Current Outpatient Medications:  albuterol (VENTOLIN HFA) 108 (90 Base) MCG/ACT inhaler, Inhale 2 puffs into the lungs every 2 (two) hours as needed for wheezing or shortness of breath (cough)., Disp: 8 g, Rfl: 4   bictegravir-emtricitabine-tenofovir AF (BIKTARVY) 50-200-25 MG TABS tablet, Take 1 tablet by mouth daily., Disp: 30 tablet, Rfl: 11   mirtazapine (REMERON) 15 MG tablet, Take 1 tablet (15 mg total) by mouth at bedtime., Disp: 30 tablet, Rfl: 5   azithromycin (ZITHROMAX) 250 MG tablet, Take 500mg  (2 tablets) on day 1 and 250mg  (1 tablet) once daily on days 2 to 5 (Patient not taking: Reported on 12/31/2020), Disp: 6 tablet, Rfl: 0   doxycycline (VIBRAMYCIN) 100 MG capsule, Take 1 capsule (100 mg total) by mouth 2 (two) times daily. (Patient not taking: Reported on 12/31/2020), Disp: 20 capsule, Rfl: 0   predniSONE  (DELTASONE) 50 MG tablet, Take 1 tablet (50 mg total) by mouth daily. (Patient not taking: Reported on 12/31/2020), Disp: 5 tablet, Rfl: 0    Review of Systems  Constitutional:  Positive for appetite change and unexpected weight change. Negative for activity change, chills, diaphoresis, fatigue and fever.  HENT:  Negative for congestion, rhinorrhea, sinus pressure, sneezing, sore throat and trouble swallowing.   Eyes:  Negative for photophobia and visual disturbance.  Respiratory:  Positive for cough and wheezing. Negative for chest tightness, shortness of breath and stridor.   Cardiovascular:  Negative for chest pain, palpitations and leg swelling.  Gastrointestinal:  Negative for abdominal distention, abdominal pain, anal bleeding, blood in stool, constipation, diarrhea, nausea and vomiting.  Genitourinary:  Negative for difficulty urinating, dysuria, flank pain and hematuria.  Musculoskeletal:  Negative for arthralgias, back pain, gait problem, joint swelling and myalgias.  Skin:  Positive for rash. Negative for color change, pallor and wound.  Neurological:  Negative for dizziness, tremors, weakness and light-headedness.  Hematological:  Negative for adenopathy. Does not bruise/bleed easily.  Psychiatric/Behavioral:  Positive for sleep disturbance. Negative for agitation, behavioral problems, confusion, decreased concentration and dysphoric mood.       Objective:   Physical Exam Constitutional:      Appearance: He is well-developed.  HENT:     Head: Normocephalic and atraumatic.  Eyes:     General:        Right eye: No discharge.        Left eye: No discharge.     Extraocular Movements: Extraocular movements intact.     Conjunctiva/sclera: Conjunctivae normal.  Cardiovascular:     Rate and Rhythm: Normal rate and regular rhythm.  Pulmonary:     Effort: Pulmonary effort is normal. No respiratory distress.     Breath sounds: No wheezing.  Abdominal:     General: There is no  distension.     Palpations: Abdomen is soft.  Musculoskeletal:        General: No tenderness. Normal range of motion.     Cervical back: Normal range of motion and neck supple.  Skin:    General: Skin is warm and dry.     Coloration: Skin is not pale.     Findings: No erythema or rash.  Neurological:     General: No focal deficit present.     Mental Status: He is alert and oriented to person, place, and time.  Psychiatric:        Mood and Affect: Mood normal.        Behavior: Behavior normal.        Thought Content: Thought content normal.  Judgment: Judgment normal.     Several areas where he seems to be scratching himself but I wonder may be infected hair follicles but he is adamant against this being the case.       Assessment & Plan:   HIV/AIDS:  We are starting him on Biktarvy.  Reviewed his viral load from September 22 which was 46,700 with CD4 count of 198, with his HIV genotype showing a 138 mutation.  I will have him come back in 1 month's time for repeat labs and then see me roughly a week or 2 afterwards.  I minutes refrain from starting PCP prophylaxis on the hopes that his CD4 count will immediately jump above 200 and stay that way.  I have emphasized to him the utmost importance of maintaining adherence to antiretrovirals for his own health and for those with whom he has sexual relations.  He is currently with a fianc who is HIV seronegative.  They are not having any sexual intercourse due to her cultural and religious believes.  I again explained to him though that if they became sexually active is very important for him to be undetectable.  Also did raise the issue of fianc being on preexposure prophylaxis but it does not sound like that something will be even considered until they get married.  Several pruritic skin lesions: I would think these could be infected hair follicles but they could be related to some type of allergy as well.  Since he  brings up the issue of tick exposure I felt it is worthwhile to check for alpha gal in case he is allergic to meat now.  Asthma I prescribed him with a metered-dose inhaler of albuterol which was sent through [facility] pharmacy  Insomnia and depression we will reinitiate Remeron 15 mg at bedtime  History of hypertension: He does not want to start medications for this yet I advised him to check blood pressures at home so we have more data  History of hyperlipidemia: We will follow his lipids and look for his need for statin.  Smoking: Counseled him to stop smoking.  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  I spent 65 minutes with the patient including than 50% of the time in face to face counseling of the patient regarding the nature of his HIV virus and the resistance data we have on it regarding the controversies of abacavir and cardiovascular risk regarding his new antiviral regimen BIKTARVY regarding issues with weight gain with integrase strand transfer inhibitors and to an offer alafenamide, undetectable equals on transmissible, treatment as prevention personally reviewing labs as mentioned above along with labs from [facility] along with review of medical records in preparation for the visit and during the visit and in coordination of his care.

## 2021-01-05 LAB — ALPHA-GAL PANEL
Beef IgE: <0.1 kU/L (ref <0.35)
Class: 0
Class: 0
Galactose-alpha-1,3-galactose IgE: 0.16 kU/L — ABNORMAL HIGH (ref <0.10)
LAMB/MUTTON IGE: <0.1 kU/L (ref <0.35)
Pork IgE: 0.11 kU/L (ref <0.35)

## 2021-01-21 ENCOUNTER — Other Ambulatory Visit (HOSPITAL_COMMUNITY): Payer: Self-pay

## 2021-01-22 ENCOUNTER — Other Ambulatory Visit (HOSPITAL_COMMUNITY): Payer: Self-pay

## 2021-01-27 ENCOUNTER — Other Ambulatory Visit (HOSPITAL_COMMUNITY): Payer: Self-pay

## 2021-02-12 ENCOUNTER — Other Ambulatory Visit: Payer: Self-pay

## 2021-02-12 ENCOUNTER — Encounter: Payer: Self-pay | Admitting: Infectious Disease

## 2021-02-12 ENCOUNTER — Other Ambulatory Visit (HOSPITAL_COMMUNITY): Payer: Self-pay

## 2021-02-12 ENCOUNTER — Ambulatory Visit (INDEPENDENT_AMBULATORY_CARE_PROVIDER_SITE_OTHER): Payer: Medicare Other | Admitting: Infectious Disease

## 2021-02-12 VITALS — BP 136/92 | HR 79 | Temp 98.0°F | Wt 207.0 lb

## 2021-02-12 DIAGNOSIS — L299 Pruritus, unspecified: Secondary | ICD-10-CM | POA: Diagnosis not present

## 2021-02-12 DIAGNOSIS — T781XXA Other adverse food reactions, not elsewhere classified, initial encounter: Secondary | ICD-10-CM

## 2021-02-12 DIAGNOSIS — B20 Human immunodeficiency virus [HIV] disease: Secondary | ICD-10-CM | POA: Diagnosis present

## 2021-02-12 DIAGNOSIS — I10 Essential (primary) hypertension: Secondary | ICD-10-CM

## 2021-02-12 DIAGNOSIS — E785 Hyperlipidemia, unspecified: Secondary | ICD-10-CM | POA: Diagnosis not present

## 2021-02-12 DIAGNOSIS — T7819XA Other adverse food reactions, not elsewhere classified, initial encounter: Secondary | ICD-10-CM

## 2021-02-12 DIAGNOSIS — J4540 Moderate persistent asthma, uncomplicated: Secondary | ICD-10-CM

## 2021-02-12 DIAGNOSIS — M1990 Unspecified osteoarthritis, unspecified site: Secondary | ICD-10-CM | POA: Diagnosis not present

## 2021-02-12 DIAGNOSIS — F17200 Nicotine dependence, unspecified, uncomplicated: Secondary | ICD-10-CM

## 2021-02-12 DIAGNOSIS — J45909 Unspecified asthma, uncomplicated: Secondary | ICD-10-CM | POA: Insufficient documentation

## 2021-02-12 HISTORY — DX: Other adverse food reactions, not elsewhere classified, initial encounter: T78.1XXA

## 2021-02-12 MED ORDER — ADVAIR HFA 115-21 MCG/ACT IN AERO
2.0000 | INHALATION_SPRAY | Freq: Two times a day (BID) | RESPIRATORY_TRACT | 12 refills | Status: DC
  Filled 2021-02-12: qty 12, 30d supply, fill #0

## 2021-02-12 MED ORDER — DICLOFENAC SODIUM 75 MG PO TBEC
75.0000 mg | DELAYED_RELEASE_TABLET | Freq: Two times a day (BID) | ORAL | 3 refills | Status: DC
  Filled 2021-02-12: qty 60, 30d supply, fill #0
  Filled 2021-05-05: qty 60, 30d supply, fill #1
  Filled 2021-06-03: qty 60, 30d supply, fill #2
  Filled 2021-10-29: qty 60, 30d supply, fill #3

## 2021-02-12 NOTE — Progress Notes (Signed)
Subjective:  Chief complaints: Arthritic pain in his knees for which he used to take diclofenac, recent need for more albuterol MDI use, also persistent wounds where he has been scratching worried previously had tick bites   Patient ID: Mike Harrison, male    DOB: 02/20/1961, 60 y.o.   MRN: [id number]  HPI  Mike Harrison is a 60 year old black man diagnosed with HIV and AIDS in 2007.  He does not recall a specific opportunistic infection though he remembers being on Bactrim for PCP prophylaxis when he developed a rash.  In the past when he has had a CD4 count below 200 he has been on dapsone typically.  He was taken care of at [facility] clinic recently and had been on [place].  Past antiviral history per the patient included Sustiva and Truvada followed by some other medications with Truvada which he cannot recall.  Resistance testing in 2019 on 2 occasions at [facility] revealed a K 70 R as well as a 130 8G and a 130 8A.  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  He had been off medications for nearly 9 months viral load was in the 40,000 range and CD4 count at 198.  He has had comorbid hypertension hyperlipidemia and asthma.  He says that the hyperlipidemia and hypertension have resolved when he is lost weight.  He had some intolerance to different antihypertensives including apparent scrotal swelling on thiazide diuretic and an ACE inhibitor induced cough.  At the last note visit he pointed out some skin lesions after having been bitten by several ticks a year ago.  He has not had fevers chills or systemic symptoms there is no history of any erythema migrans type rash.  He does have several areas 1 on his ankle also 1 on his torso and one near his armpit that he says itch intensely in which she scratches.  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  The beef IgE lab mutton IgE and pork IgE were all within normal.  He said he ate steak last week without problems.  Cherlyn Cushing is if there are not really any new lesions that are coming up besides the ones that he continues to scratch at.  I have asked him not to scratch at them anymore.  He also has taken tweezers to them on several occasions and I suspect he is auto inoculating himself with bacteria with this concern for these tick bite sites.   He is noticed that he is coughing a little bit more recently and wonders if it is related to the [place].  I told him that it was not related to [facility] he is as mentioned an asthmatic and also still smoking cigarettes.      Past Medical History:  Diagnosis Date   Acute medial meniscus tear    Anxiety    Arthritis    Depression    Hyperlipidemia 12/31/2020   Hypertension 12/31/2020    Past Surgical History:  Procedure Laterality Date   HERNIA REPAIR      Family History  Problem Relation Age of Onset   Cancer Mother    Hypertension Mother    Cirrhosis Father       Social History   Socioeconomic History   Marital status: Widowed    Spouse name: Not on file   Number  of children: Not on file   Years of education: Not on file   Highest education level: Not on file  Occupational History   Not on file  Tobacco Use   Smoking status: Every Day    Packs/day: 0.50    Types: Cigarettes   Smokeless tobacco: Never  Vaping Use   Vaping Use: Never used  Substance and Sexual Activity   Alcohol use: Never   Drug use: Never   Sexual activity: Not on file  Other Topics Concern   Not on file  Social History Narrative   Not on file   Social Determinants of Health   Financial Resource Strain: Not on file  Food Insecurity: Not on file  Transportation Needs: Not on file  Physical Activity: Not on file  Stress: Not on file  Social Connections: Not on file    Allergies  Allergen Reactions   Hydrochlorothiazide Other (See  Comments)    Scrotal edema   Lisinopril Other (See Comments)    Dry cough   Bactrim [Sulfamethoxazole-Trimethoprim]      Current Outpatient Medications:    albuterol (VENTOLIN HFA) 108 (90 Base) MCG/ACT inhaler, Inhale 2 puffs into the lungs every 2 (two) hours as needed for wheezing or shortness of breath (cough)., Disp: 18 g, Rfl: 4   bictegravir-emtricitabine-tenofovir AF (BIKTARVY) 50-200-25 MG TABS tablet, Take 1 tablet by mouth daily., Disp: 30 tablet, Rfl: 11   mirtazapine (REMERON) 15 MG tablet, Take 1 tablet (15 mg total) by mouth at bedtime., Disp: 30 tablet, Rfl: 5   azithromycin (ZITHROMAX) 250 MG tablet, Take 500mg  (2 tablets) on day 1 and 250mg  (1 tablet) once daily on days 2 to 5 (Patient not taking: Reported on 12/31/2020), Disp: 6 tablet, Rfl: 0   doxycycline (VIBRAMYCIN) 100 MG capsule, Take 1 capsule (100 mg total) by mouth 2 (two) times daily. (Patient not taking: Reported on 12/31/2020), Disp: 20 capsule, Rfl: 0   predniSONE (DELTASONE) 50 MG tablet, Take 1 tablet (50 mg total) by mouth daily. (Patient not taking: Reported on 12/31/2020), Disp: 5 tablet, Rfl: 0    Review of Systems  Constitutional:  Negative for activity change, appetite change, chills, diaphoresis, fatigue, fever and unexpected weight change.  HENT:  Negative for congestion, rhinorrhea, sinus pressure, sneezing, sore throat and trouble swallowing.   Eyes:  Negative for photophobia and visual disturbance.  Respiratory:  Positive for cough. Negative for chest tightness, shortness of breath, wheezing and stridor.   Cardiovascular:  Negative for chest pain, palpitations and leg swelling.  Gastrointestinal:  Negative for abdominal distention, abdominal pain, anal bleeding, blood in stool, constipation, diarrhea, nausea and vomiting.  Genitourinary:  Negative for difficulty urinating, dysuria, flank pain and hematuria.  Musculoskeletal:  Negative for arthralgias, back pain, gait problem, joint swelling and  myalgias.  Skin:  Positive for wound. Negative for color change, pallor and rash.  Neurological:  Negative for dizziness, tremors, weakness and light-headedness.  Hematological:  Negative for adenopathy. Does not bruise/bleed easily.  Psychiatric/Behavioral:  Negative for agitation, behavioral problems, confusion, decreased concentration, dysphoric mood and sleep disturbance.       Objective:   Physical Exam Constitutional:      Appearance: He is well-developed.  HENT:     Head: Normocephalic and atraumatic.  Eyes:     Conjunctiva/sclera: Conjunctivae normal.  Cardiovascular:     Rate and Rhythm: Normal rate and regular rhythm.  Pulmonary:     Effort: Pulmonary effort is normal. No respiratory distress.  Breath sounds: No wheezing.  Abdominal:     General: There is no distension.     Palpations: Abdomen is soft.  Musculoskeletal:        General: No tenderness. Normal range of motion.     Cervical back: Normal range of motion and neck supple.  Skin:    General: Skin is warm and dry.     Coloration: Skin is not pale.     Findings: No erythema or rash.  Neurological:     General: No focal deficit present.     Mental Status: He is alert and oriented to person, place, and time.  Psychiatric:        Mood and Affect: Mood normal.        Behavior: Behavior normal.        Thought Content: Thought content normal.        Judgment: Judgment normal.    He has an area on his torso as well as 2 areas on his leg where he appears to be scratching himself and as mentioned likely continuing to perpetuate the cycle of left tissue infection        Assessment & Plan:   HIV/AIDS:  I am ordering a repeat viral load with CD4 count CBC and CMP today.  I am going to continue his BIKTARVY prescription   Several pruritic skin lesions: As mentioned in my last note I continue to think these could be infected hair follicles.  I had ordered an alpha gal panel as described above because of  concerns of that he could have some type of allergic component to his skin condition.  The fact though that these are in very specific areas where he had specific tick bites makes me think that that is not what is causing the skin lesions.  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 I have told him if he indeed has alpha gal he should not be consuming products that have beef pork or meat.  I am going to refer him to allergy and immunology for clarification about the significance of this positive test.    Asthma: He is continuing on metered-dose inhaler I am initiating inhaled steroid for him to take twice daily  Insomnia and depression continue Remeron  Hypertension: Not on medications at present but attempting to lose weight I am also referring him to primary care.  Smoking: Counseled him on the importance of smoking cessation I would prefer if he vapes instead of smokes conventional cigarettes  I spent 41 minutes with the patient including face to face counseling of the patient guarding his HIV infection his antiretroviral history his pruritic skin condition his positive alpha gal test his asthma his smoking, along with review of medical records before and during the visit and in coordination of his care.

## 2021-02-17 LAB — COMPLETE METABOLIC PANEL WITH GFR
AG Ratio: 1.2 (calc) (ref 1.0–2.5)
ALT: 13 U/L (ref 9–46)
AST: 15 U/L (ref 10–35)
Albumin: 4.2 g/dL (ref 3.6–5.1)
Alkaline phosphatase (APISO): 62 U/L (ref 35–144)
BUN: 12 mg/dL (ref 7–25)
CO2: 25 mmol/L (ref 20–32)
Calcium: 9.4 mg/dL (ref 8.6–10.3)
Chloride: 106 mmol/L (ref 98–110)
Creat: 1.13 mg/dL (ref 0.70–1.30)
Globulin: 3.6 g/dL (calc) (ref 1.9–3.7)
Glucose, Bld: 86 mg/dL (ref 65–99)
Potassium: 4.2 mmol/L (ref 3.5–5.3)
Sodium: 137 mmol/L (ref 135–146)
Total Bilirubin: 0.9 mg/dL (ref 0.2–1.2)
Total Protein: 7.8 g/dL (ref 6.1–8.1)
eGFR: 75 mL/min/{1.73_m2} (ref >=60)

## 2021-02-17 LAB — CBC WITH DIFFERENTIAL/PLATELET
Absolute Monocytes: 518 cells/uL (ref 200–950)
Basophils Absolute: 38 cells/uL (ref 0–200)
Basophils Relative: 0.7 %
Eosinophils Absolute: 119 cells/uL (ref 15–500)
Eosinophils Relative: 2.2 %
HCT: 48.1 % (ref 38.5–50.0)
Hemoglobin: 16.3 g/dL (ref 13.2–17.1)
Lymphs Abs: 1561 cells/uL (ref 850–3900)
MCH: 31.5 pg (ref 27.0–33.0)
MCHC: 33.9 g/dL (ref 32.0–36.0)
MCV: 92.9 fL (ref 80.0–100.0)
MPV: 10.7 fL (ref 7.5–12.5)
Monocytes Relative: 9.6 %
Neutro Abs: 3164 cells/uL (ref 1500–7800)
Neutrophils Relative %: 58.6 %
Platelets: 294 10*3/uL (ref 140–400)
RBC: 5.18 10*6/uL (ref 4.20–5.80)
RDW: 13.8 % (ref 11.0–15.0)
Total Lymphocyte: 28.9 %
WBC: 5.4 10*3/uL (ref 3.8–10.8)

## 2021-02-17 LAB — T-HELPER CELLS (CD4) COUNT (NOT AT ARMC)
Absolute CD4: 220 cells/uL — ABNORMAL LOW (ref 490–1740)
CD4 T Helper %: 14 % — ABNORMAL LOW (ref 30–61)
Total lymphocyte count: 1534 cells/uL (ref 850–3900)

## 2021-02-17 LAB — RPR: RPR Ser Ql: NONREACTIVE

## 2021-02-17 LAB — HIV RNA, RTPCR W/R GT (RTI, PI,INT)
HIV 1 RNA Quant: <20 copies/mL — AB
HIV-1 RNA Quant, Log: <1.3 Log copies/mL — AB

## 2021-02-23 ENCOUNTER — Other Ambulatory Visit (HOSPITAL_COMMUNITY): Payer: Self-pay

## 2021-03-24 ENCOUNTER — Other Ambulatory Visit (HOSPITAL_COMMUNITY): Payer: Self-pay

## 2021-03-26 ENCOUNTER — Other Ambulatory Visit (HOSPITAL_COMMUNITY): Payer: Self-pay

## 2021-03-29 ENCOUNTER — Other Ambulatory Visit (HOSPITAL_COMMUNITY): Payer: Self-pay

## 2021-04-21 ENCOUNTER — Other Ambulatory Visit (HOSPITAL_COMMUNITY): Payer: Self-pay

## 2021-04-23 ENCOUNTER — Other Ambulatory Visit (HOSPITAL_COMMUNITY): Payer: Self-pay

## 2021-05-05 ENCOUNTER — Other Ambulatory Visit (HOSPITAL_COMMUNITY): Payer: Self-pay

## 2021-05-05 ENCOUNTER — Telehealth: Payer: Self-pay

## 2021-05-05 NOTE — Telephone Encounter (Signed)
[facility] Patient Advocate Encounter  [facility] and I have been unsuccsessful in reaching patient to be able to refill medication.    We have tried multiple times without a response.  Estalee Mccandlish, CPhT Specialty Pharmacy Patient [facility] Phone: [phone] Fax:  [phone]  

## 2021-05-07 ENCOUNTER — Other Ambulatory Visit (HOSPITAL_COMMUNITY): Payer: Self-pay

## 2021-05-12 ENCOUNTER — Ambulatory Visit: Payer: Medicare Other | Admitting: Infectious Disease

## 2021-06-03 ENCOUNTER — Other Ambulatory Visit (HOSPITAL_COMMUNITY): Payer: Self-pay

## 2021-07-01 ENCOUNTER — Other Ambulatory Visit (HOSPITAL_COMMUNITY): Payer: Self-pay

## 2021-07-02 ENCOUNTER — Other Ambulatory Visit (HOSPITAL_COMMUNITY): Payer: Self-pay

## 2021-07-09 ENCOUNTER — Other Ambulatory Visit (HOSPITAL_COMMUNITY): Payer: Self-pay

## 2021-08-20 IMAGING — CR DG CHEST 2V
2 series · 2 of 2 positions shown · non-contrast
Comparison: None.

CLINICAL DATA: 59-year-old male with cough.

EXAM:
CHEST - 2 VIEW

[w chest pa]
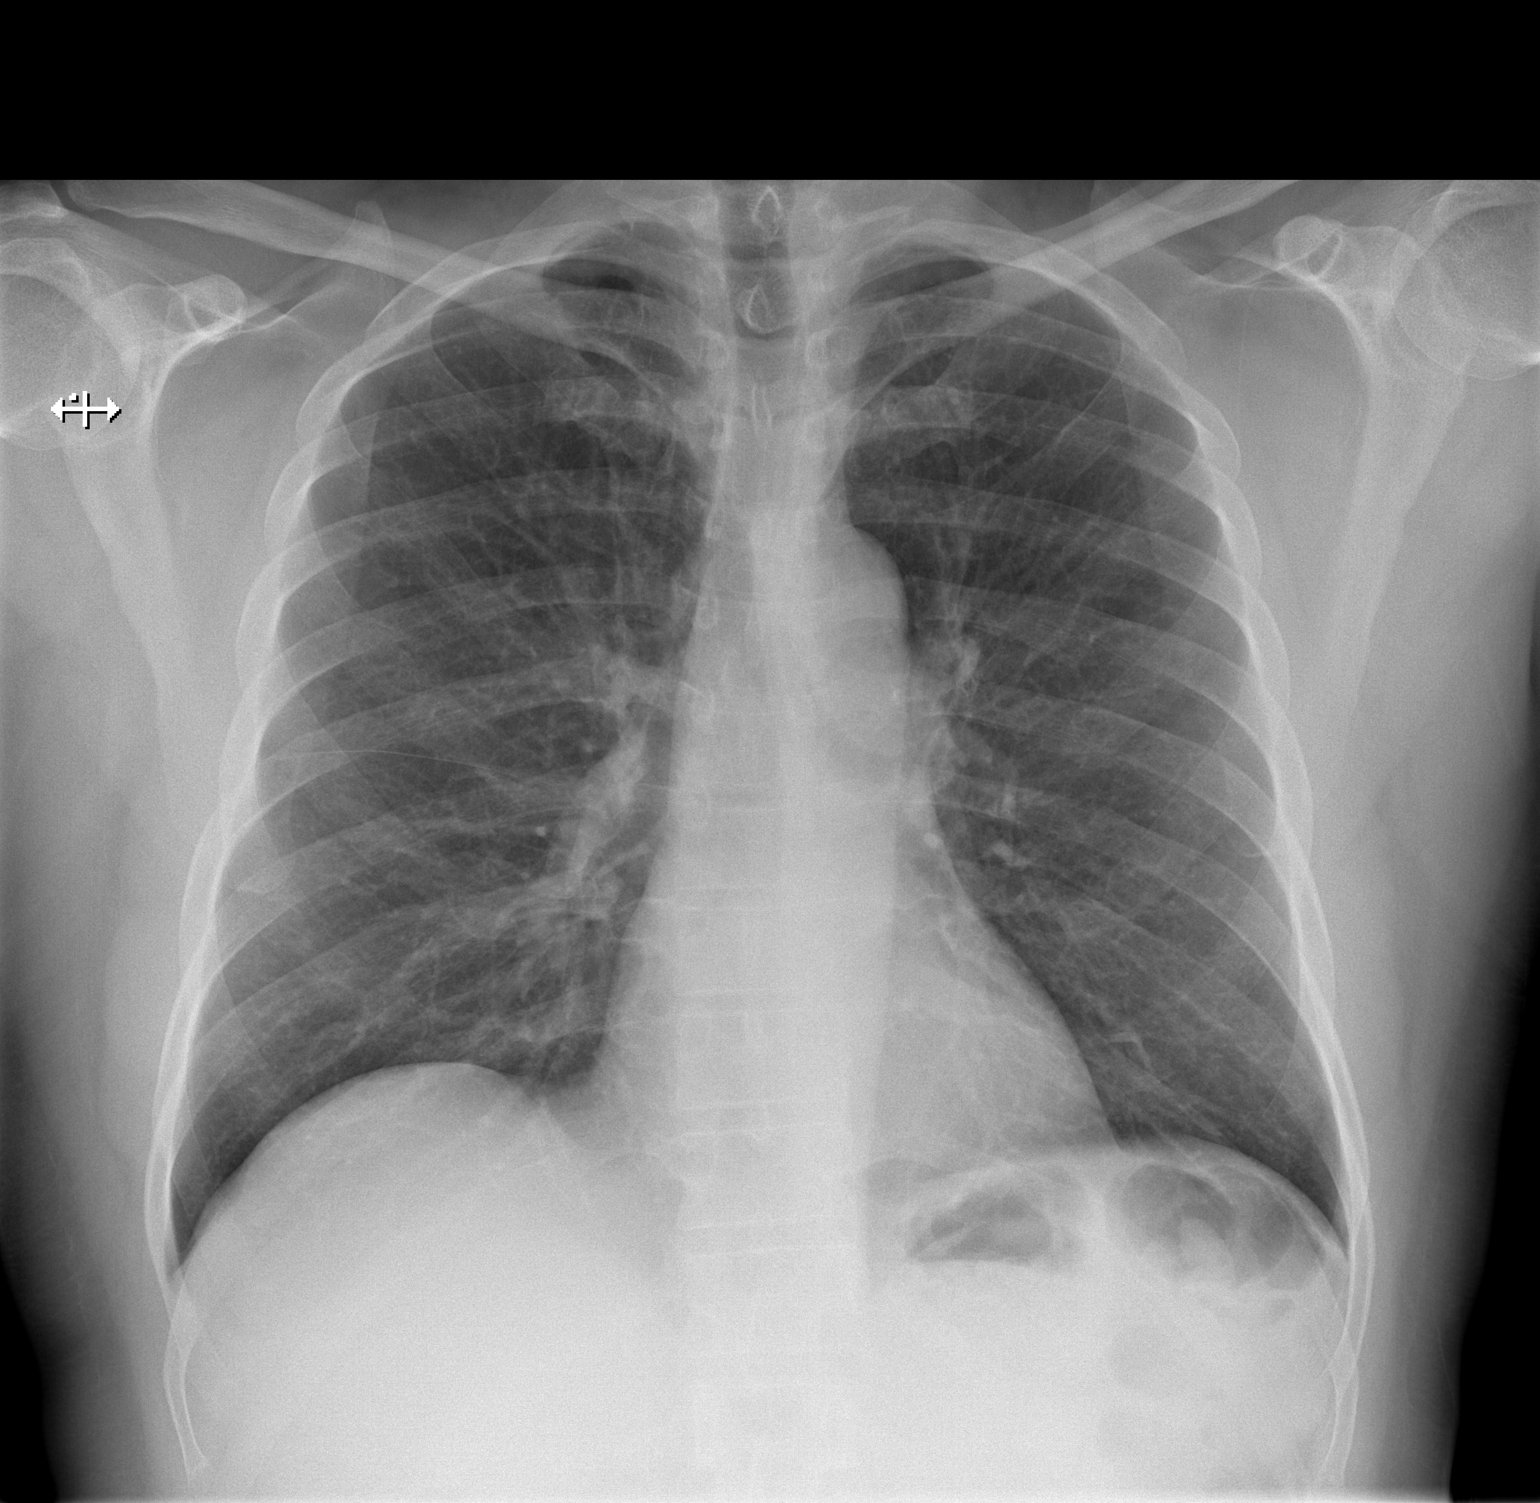

[w chest lat]
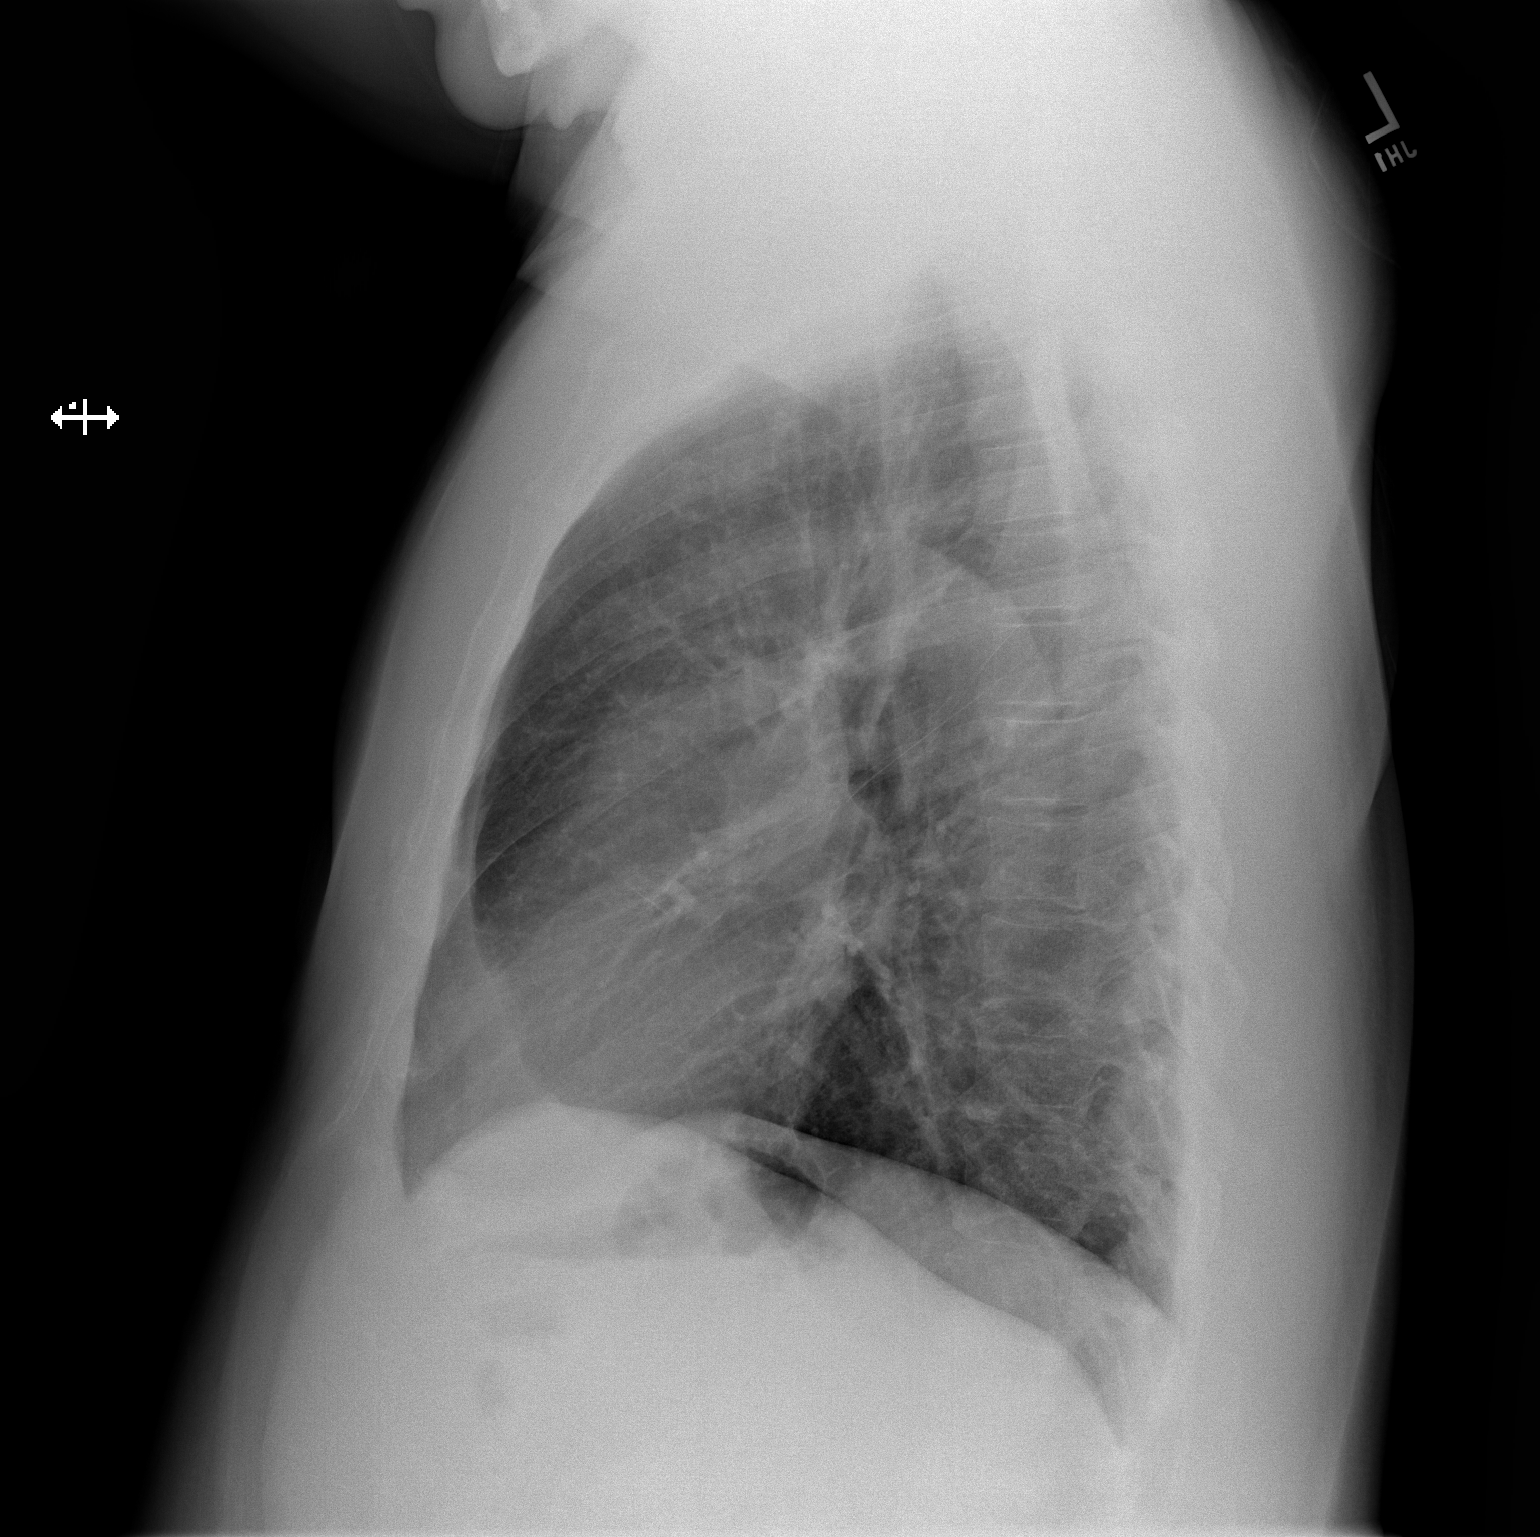

[2 of 2 positions shown; findings below may reference images not displayed]

FINDINGS: The heart size and mediastinal contours are within normal limits.
Both lungs are clear. The visualized skeletal structures are
unremarkable.
IMPRESSION: No active cardiopulmonary disease.

## 2021-08-27 DIAGNOSIS — Z0100 Encounter for examination of eyes and vision without abnormal findings: Secondary | ICD-10-CM | POA: Diagnosis not present

## 2021-08-31 ENCOUNTER — Other Ambulatory Visit (HOSPITAL_COMMUNITY): Payer: Self-pay

## 2021-09-03 ENCOUNTER — Other Ambulatory Visit (HOSPITAL_COMMUNITY): Payer: Self-pay

## 2021-09-03 ENCOUNTER — Ambulatory Visit: Payer: Medicare HMO | Admitting: Infectious Disease

## 2021-09-06 ENCOUNTER — Other Ambulatory Visit (HOSPITAL_COMMUNITY): Payer: Self-pay

## 2021-09-29 ENCOUNTER — Other Ambulatory Visit (HOSPITAL_COMMUNITY): Payer: Self-pay

## 2021-10-05 ENCOUNTER — Other Ambulatory Visit (HOSPITAL_COMMUNITY): Payer: Self-pay

## 2021-10-06 ENCOUNTER — Telehealth: Payer: Self-pay

## 2021-10-06 NOTE — Telephone Encounter (Signed)
Patient called asking for referral to sports medicine, states he's still having knee pain that is not improving with Voltaren gel and would like to see sports medicine. Will send MyChart message with info for sports medicine provider.   Sandie Ano, RN

## 2021-10-28 ENCOUNTER — Ambulatory Visit: Payer: Medicare HMO | Admitting: Infectious Disease

## 2021-10-29 ENCOUNTER — Other Ambulatory Visit (HOSPITAL_COMMUNITY): Payer: Self-pay

## 2021-11-01 ENCOUNTER — Other Ambulatory Visit (HOSPITAL_COMMUNITY): Payer: Self-pay

## 2021-11-02 ENCOUNTER — Ambulatory Visit: Payer: Medicare HMO | Admitting: Infectious Diseases

## 2021-11-02 ENCOUNTER — Ambulatory Visit: Payer: Medicare HMO | Admitting: Infectious Disease

## 2021-11-03 ENCOUNTER — Other Ambulatory Visit (HOSPITAL_COMMUNITY): Payer: Self-pay

## 2021-11-10 ENCOUNTER — Encounter: Payer: Self-pay | Admitting: Infectious Disease

## 2021-11-10 ENCOUNTER — Other Ambulatory Visit (HOSPITAL_COMMUNITY): Payer: Self-pay

## 2021-11-10 ENCOUNTER — Ambulatory Visit (INDEPENDENT_AMBULATORY_CARE_PROVIDER_SITE_OTHER): Payer: Medicare HMO | Admitting: Infectious Disease

## 2021-11-10 ENCOUNTER — Other Ambulatory Visit (HOSPITAL_COMMUNITY)
Admission: RE | Admit: 2021-11-10 | Discharge: 2021-11-10 | Disposition: A | Discharge status: Home / Self Care | Payer: Medicare HMO | Source: Ambulatory Visit | Attending: Infectious Disease | Admitting: Infectious Disease

## 2021-11-10 ENCOUNTER — Other Ambulatory Visit: Payer: Self-pay

## 2021-11-10 VITALS — BP 146/91 | HR 63 | Resp 16 | Ht 71.0 in | Wt 210.0 lb

## 2021-11-10 DIAGNOSIS — R69 Illness, unspecified: Secondary | ICD-10-CM | POA: Diagnosis not present

## 2021-11-10 DIAGNOSIS — B20 Human immunodeficiency virus [HIV] disease: Secondary | ICD-10-CM | POA: Diagnosis not present

## 2021-11-10 DIAGNOSIS — M1990 Unspecified osteoarthritis, unspecified site: Secondary | ICD-10-CM

## 2021-11-10 DIAGNOSIS — L299 Pruritus, unspecified: Secondary | ICD-10-CM | POA: Diagnosis not present

## 2021-11-10 DIAGNOSIS — Z79899 Other long term (current) drug therapy: Secondary | ICD-10-CM | POA: Diagnosis not present

## 2021-11-10 DIAGNOSIS — M79674 Pain in right toe(s): Secondary | ICD-10-CM | POA: Diagnosis not present

## 2021-11-10 DIAGNOSIS — F17200 Nicotine dependence, unspecified, uncomplicated: Secondary | ICD-10-CM

## 2021-11-10 DIAGNOSIS — F17210 Nicotine dependence, cigarettes, uncomplicated: Secondary | ICD-10-CM

## 2021-11-10 DIAGNOSIS — B351 Tinea unguium: Secondary | ICD-10-CM

## 2021-11-10 DIAGNOSIS — I10 Essential (primary) hypertension: Secondary | ICD-10-CM

## 2021-11-10 DIAGNOSIS — T781XXA Other adverse food reactions, not elsewhere classified, initial encounter: Secondary | ICD-10-CM

## 2021-11-10 DIAGNOSIS — Z91018 Allergy to other foods: Secondary | ICD-10-CM | POA: Insufficient documentation

## 2021-11-10 DIAGNOSIS — E785 Hyperlipidemia, unspecified: Secondary | ICD-10-CM

## 2021-11-10 DIAGNOSIS — M79676 Pain in unspecified toe(s): Secondary | ICD-10-CM | POA: Insufficient documentation

## 2021-11-10 MED ORDER — BIKTARVY 50-200-25 MG PO TABS
1.0000 | ORAL_TABLET | Freq: Every day | ORAL | 11 refills | Status: DC
  Filled 2021-11-10 – 2021-12-16 (×2): qty 30, 30d supply, fill #0
  Filled 2022-01-17: qty 30, 30d supply, fill #1
  Filled 2022-02-16: qty 30, 30d supply, fill #2
  Filled 2022-04-18: qty 30, 30d supply, fill #3

## 2021-11-10 MED ORDER — PITAVASTATIN MAGNESIUM 4 MG PO TABS
4.0000 mg | ORAL_TABLET | Freq: Every day | ORAL | 11 refills | Status: DC
  Filled 2021-11-10: qty 30, 30d supply, fill #0
  Filled 2021-12-20 – 2022-01-17 (×2): qty 30, 30d supply, fill #1
  Filled 2022-04-18: qty 30, 30d supply, fill #2

## 2021-11-10 NOTE — Progress Notes (Signed)
Subjective:  Chief complaint: Follow-up for HIV disease on medications, complaining of some arthritic pain in his knee also with problems related to his implants and still episodic pruritus  Patient ID: Mike Harrison, male    DOB: 04/30/1960, 61 y.o.   MRN: [id number]  HPI  Mike Harrison is a 61 year-old black man diagnosed with HIV and AIDS in 2007.  He does not recall a specific opportunistic infection though he remembers being on Bactrim for PCP prophylaxis when he developed a rash.  In the past when he has had a CD4 count below 200 he has been on dapsone typically.  He was taken care of at [facility] clinic recently and had been on TRIUMEQ.  Past antiviral history per the patient included Sustiva and Truvada followed by some other medications with Truvada which he cannot recall.  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  He had been off medications for nearly 9 months viral load was in the 40,000 range and CD4 count at 198.  He has had comorbid hypertension hyperlipidemia and asthma.  He says that the hyperlipidemia and hypertension have resolved when he is lost weight.  He had some intolerance to different antihypertensives including apparent scrotal swelling on thiazide diuretic and an ACE inhibitor induced cough.  Avril visits ago he had some pruritic skin lesions and thought that this could be due to a prior tick bites.  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  The beef IgE lab mutton IgE and pork IgE were all within normal.  He has continued to eat steak and other mammal products without having any problems he does have intermittent pruritus that may be related some of the foods he eats but he is not sure.  I did refer  him to allergy immunology but he has not yet made an appointment with them that they did call him.  He is now complaining also some left-sided knee pain which is an area where he previously had what sounds like a prepatellar septic bursitis.  He also is complaining of pain in his toenail where he appears to have some onychomycosis.  He is also concerned that he is not able to bulk up as much as he did before in the past when lifting weights and wonder if this is related to his antivirals.      Past Medical History:  Diagnosis Date   Acute medial meniscus tear    Allergic reaction to alpha-gal 02/12/2021   Anxiety    Arthritis    Arthritis    Asthma    Depression    Hyperlipidemia 12/31/2020   Hypertension 12/31/2020   Pruritus    Smoker     Past Surgical History:  Procedure Laterality Date   HERNIA REPAIR      Family History  Problem Relation Age of Onset   Cancer Mother    Hypertension Mother    Cirrhosis Father       Social History   Socioeconomic History   Marital status: Widowed    Spouse name: Not on file   Number of children: Not on file   Years of education: Not on file   Highest education  level: Not on file  Occupational History   Not on file  Tobacco Use   Smoking status: Every Day    Packs/day: 0.50    Types: Cigarettes   Smokeless tobacco: Never  Vaping Use   Vaping Use: Never used  Substance and Sexual Activity   Alcohol use: Never   Drug use: Never   Sexual activity: Not on file  Other Topics Concern   Not on file  Social History Narrative   Not on file   Social Determinants of Health   Financial Resource Strain: Not on file  Food Insecurity: Not on file  Transportation Needs: Not on file  Physical Activity: Not on file  Stress: Not on file  Social Connections: Not on file    Allergies  Allergen Reactions   Hydrochlorothiazide Other (See Comments)    Scrotal edema   Lisinopril Other (See Comments)    Dry cough   Bactrim  [Sulfamethoxazole-Trimethoprim]      Current Outpatient Medications:    albuterol (VENTOLIN HFA) 108 (90 Base) MCG/ACT inhaler, Inhale 2 puffs into the lungs every 2 (two) hours as needed for wheezing or shortness of breath (cough)., Disp: 18 g, Rfl: 4   bictegravir-emtricitabine-tenofovir AF (BIKTARVY) 50-200-25 MG TABS tablet, Take 1 tablet by mouth daily., Disp: 30 tablet, Rfl: 11   diclofenac (VOLTAREN) 75 MG EC tablet, Take 1 tablet (75 mg total) by mouth 2 (two) times daily., Disp: 60 tablet, Rfl: 3   fluticasone-salmeterol (ADVAIR HFA) 115-21 MCG/ACT inhaler, Inhale 2 puffs into the lungs 2 (two) times daily., Disp: 12 g, Rfl: 12   mirtazapine (REMERON) 15 MG tablet, Take 1 tablet (15 mg total) by mouth at bedtime., Disp: 30 tablet, Rfl: 5    Review of Systems  Constitutional:  Negative for activity change, appetite change, chills, diaphoresis, fatigue, fever and unexpected weight change.  HENT:  Negative for congestion, rhinorrhea, sinus pressure, sneezing, sore throat and trouble swallowing.   Eyes:  Negative for photophobia and visual disturbance.  Respiratory:  Negative for cough, chest tightness, shortness of breath, wheezing and stridor.   Cardiovascular:  Negative for chest pain, palpitations and leg swelling.  Gastrointestinal:  Negative for abdominal distention, abdominal pain, anal bleeding, blood in stool, constipation, diarrhea, nausea and vomiting.  Genitourinary:  Negative for difficulty urinating, dysuria, flank pain and hematuria.  Musculoskeletal:  Positive for arthralgias. Negative for back pain, gait problem, joint swelling and myalgias.  Skin:  Positive for color change. Negative for pallor, rash and wound.  Neurological:  Negative for dizziness, tremors, weakness and light-headedness.  Hematological:  Negative for adenopathy. Does not bruise/bleed easily.  Psychiatric/Behavioral:  Negative for agitation, behavioral problems, confusion, decreased concentration,  dysphoric mood and sleep disturbance.        Objective:   Physical Exam Constitutional:      Appearance: He is well-developed.  HENT:     Head: Normocephalic and atraumatic.  Eyes:     Conjunctiva/sclera: Conjunctivae normal.  Cardiovascular:     Rate and Rhythm: Normal rate and regular rhythm.  Pulmonary:     Effort: Pulmonary effort is normal. No respiratory distress.     Breath sounds: No wheezing.  Abdominal:     General: There is no distension.     Palpations: Abdomen is soft.  Musculoskeletal:        General: No tenderness. Normal range of motion.     Cervical back: Normal range of motion and neck supple.  Skin:    General: Skin is warm  and dry.     Coloration: Skin is not pale.     Findings: No erythema or rash.  Neurological:     General: No focal deficit present.     Mental Status: He is alert and oriented to person, place, and time.  Psychiatric:        Mood and Affect: Mood normal.        Behavior: Behavior normal.        Thought Content: Thought content normal.        Judgment: Judgment normal.         Assessment & Plan:   HIV/AIDS:  I am checking his HIV VL and CD4 today  I assured him that the [facility] was not having any effect on his ability to gain weight in fact it is typically implicated in problems with gaining weight rather than difficulty putting it on.  I am sending in prescription for Biktarvy to [facility].    Lab Results  Component Value Date   HIV1RNAQUANT <20 DETECTED (A) 02/12/2021   Cardiovascular risk: I have informed him the results of reprieve and will initiate pitavastatin  HTN: I DO think he will need to back on anti HTNsives  Smoker: encouraged him to work on cessation. Asthma: He is continuing on metered-dose inhaler I am initiating inhaled steroid for him to take twice daily  Pruritus not clear what is causing this but be helpful he does see allergy immunology I did order tissue transglutaminase in case this  is somehow related to gluten.  Arthritic pain: I have referred him to sports medicine.  Onychomycosis and toe pain I have referred him to podiatry.  I spent 42 minutes with the patient including than 50% of the time in face to face counseling of the patient regarding his HIV his difficulty gaining weight his pruritus is arthritic pain otomycosis  along with review of medical records in preparation for the visit and during the visit and in coordination of his care.

## 2021-11-11 ENCOUNTER — Other Ambulatory Visit (HOSPITAL_COMMUNITY): Payer: Self-pay

## 2021-11-11 LAB — URINE CYTOLOGY ANCILLARY ONLY
Chlamydia: NEGATIVE
Comment: NEGATIVE
Comment: NORMAL
Neisseria Gonorrhea: NEGATIVE

## 2021-11-11 LAB — T-HELPER CELLS (CD4) COUNT (NOT AT ARMC)
CD4 % Helper T Cell: 15 % — ABNORMAL LOW (ref 33–65)
CD4 T Cell Abs: 274 /uL — ABNORMAL LOW (ref 400–1790)

## 2021-11-12 ENCOUNTER — Other Ambulatory Visit (HOSPITAL_COMMUNITY): Payer: Self-pay

## 2021-11-13 LAB — COMPLETE METABOLIC PANEL WITH GFR
AG Ratio: 1.4 (calc) (ref 1.0–2.5)
ALT: 20 U/L (ref 9–46)
AST: 19 U/L (ref 10–35)
Albumin: 4.6 g/dL (ref 3.6–5.1)
Alkaline phosphatase (APISO): 68 U/L (ref 35–144)
BUN/Creatinine Ratio: 11 (calc) (ref 6–22)
BUN: 15 mg/dL (ref 7–25)
CO2: 27 mmol/L (ref 20–32)
Calcium: 9.5 mg/dL (ref 8.6–10.3)
Chloride: 107 mmol/L (ref 98–110)
Creat: 1.32 mg/dL — ABNORMAL HIGH (ref 0.70–1.30)
Globulin: 3.3 g/dL (calc) (ref 1.9–3.7)
Glucose, Bld: 96 mg/dL (ref 65–99)
Potassium: 4 mmol/L (ref 3.5–5.3)
Sodium: 140 mmol/L (ref 135–146)
Total Bilirubin: 0.8 mg/dL (ref 0.2–1.2)
Total Protein: 7.9 g/dL (ref 6.1–8.1)
eGFR: 62 mL/min/{1.73_m2} (ref >=60)

## 2021-11-13 LAB — CBC WITH DIFFERENTIAL/PLATELET
Absolute Monocytes: 490 cells/uL (ref 200–950)
Basophils Absolute: 57 cells/uL (ref 0–200)
Basophils Relative: 0.8 %
Eosinophils Absolute: 263 cells/uL (ref 15–500)
Eosinophils Relative: 3.7 %
HCT: 44.2 % (ref 38.5–50.0)
Hemoglobin: 15.3 g/dL (ref 13.2–17.1)
Lymphs Abs: 1924 cells/uL (ref 850–3900)
MCH: 32.5 pg (ref 27.0–33.0)
MCHC: 34.6 g/dL (ref 32.0–36.0)
MCV: 93.8 fL (ref 80.0–100.0)
MPV: 10.6 fL (ref 7.5–12.5)
Monocytes Relative: 6.9 %
Neutro Abs: 4367 cells/uL (ref 1500–7800)
Neutrophils Relative %: 61.5 %
Platelets: 297 10*3/uL (ref 140–400)
RBC: 4.71 10*6/uL (ref 4.20–5.80)
RDW: 13 % (ref 11.0–15.0)
Total Lymphocyte: 27.1 %
WBC: 7.1 10*3/uL (ref 3.8–10.8)

## 2021-11-13 LAB — RPR: RPR Ser Ql: NONREACTIVE

## 2021-11-13 LAB — LIPID PANEL
Cholesterol: 151 mg/dL (ref <200)
HDL: 47 mg/dL (ref >=40)
LDL Cholesterol (Calc): 85 mg/dL (calc)
Non-HDL Cholesterol (Calc): 104 mg/dL (calc) (ref <130)
Total CHOL/HDL Ratio: 3.2 (calc) (ref <5.0)
Triglycerides: 92 mg/dL (ref <150)

## 2021-11-13 LAB — HIV-1 RNA QUANT-NO REFLEX-BLD
HIV 1 RNA Quant: NOT DETECTED Copies/mL
HIV-1 RNA Quant, Log: NOT DETECTED Log cps/mL

## 2021-11-13 LAB — TISSUE TRANSGLUTAMINASE, IGA: (tTG) Ab, IgA: <1 U/mL

## 2021-11-16 ENCOUNTER — Ambulatory Visit: Payer: Medicare HMO | Admitting: Sports Medicine

## 2021-11-17 ENCOUNTER — Telehealth: Payer: Self-pay

## 2021-11-17 NOTE — Telephone Encounter (Signed)
Received fax from insurance stating Mike Harrison is not covered by insurance. Temporary supply sent to patient. Will initiate PA today for coverage. Faxed supporting letter and note to coverage determination.  P: [phone] F: [phone] Juanita Laster, RMA

## 2021-11-18 ENCOUNTER — Other Ambulatory Visit (HOSPITAL_COMMUNITY): Payer: Self-pay

## 2021-11-18 NOTE — Telephone Encounter (Signed)
Insurance requesting additional information for Pitavastatin or alternative prescription.  Will forward message to provider. Juanita Laster, RMA

## 2021-11-18 NOTE — Telephone Encounter (Signed)
Received call from [facility] with Monia Pouch, provided additional clinical information.   Approval is valid through 03/27/2022, notified [facility] of approval.   Sandie Ano, RN

## 2021-11-23 ENCOUNTER — Ambulatory Visit: Payer: Medicare HMO | Admitting: Sports Medicine

## 2021-11-24 ENCOUNTER — Other Ambulatory Visit (HOSPITAL_COMMUNITY): Payer: Self-pay

## 2021-11-30 ENCOUNTER — Other Ambulatory Visit (HOSPITAL_COMMUNITY): Payer: Self-pay

## 2021-12-02 ENCOUNTER — Other Ambulatory Visit (HOSPITAL_COMMUNITY): Payer: Self-pay

## 2021-12-06 ENCOUNTER — Ambulatory Visit (INDEPENDENT_AMBULATORY_CARE_PROVIDER_SITE_OTHER): Payer: Self-pay | Admitting: Podiatry

## 2021-12-06 DIAGNOSIS — Z91199 Patient's noncompliance with other medical treatment and regimen due to unspecified reason: Secondary | ICD-10-CM

## 2021-12-06 NOTE — Progress Notes (Signed)
Patient was no-show for appointment today 

## 2021-12-07 ENCOUNTER — Other Ambulatory Visit: Payer: Self-pay

## 2021-12-07 ENCOUNTER — Encounter (HOSPITAL_COMMUNITY): Payer: Self-pay | Admitting: Emergency Medicine

## 2021-12-07 ENCOUNTER — Ambulatory Visit (HOSPITAL_COMMUNITY)
Admission: EM | Admit: 2021-12-07 | Discharge: 2021-12-07 | Disposition: A | Discharge status: Home / Self Care | Payer: Medicare HMO | Attending: Internal Medicine | Admitting: Internal Medicine

## 2021-12-07 ENCOUNTER — Emergency Department (HOSPITAL_COMMUNITY)
Admission: EM | Admit: 2021-12-07 | Discharge: 2021-12-07 | Disposition: A | Discharge status: Home / Self Care | Payer: Medicare HMO | Attending: Emergency Medicine | Admitting: Emergency Medicine

## 2021-12-07 ENCOUNTER — Encounter (HOSPITAL_COMMUNITY): Payer: Self-pay

## 2021-12-07 DIAGNOSIS — F419 Anxiety disorder, unspecified: Secondary | ICD-10-CM

## 2021-12-07 DIAGNOSIS — R002 Palpitations: Secondary | ICD-10-CM | POA: Diagnosis not present

## 2021-12-07 DIAGNOSIS — R079 Chest pain, unspecified: Secondary | ICD-10-CM | POA: Diagnosis not present

## 2021-12-07 DIAGNOSIS — I10 Essential (primary) hypertension: Secondary | ICD-10-CM | POA: Diagnosis not present

## 2021-12-07 DIAGNOSIS — F17210 Nicotine dependence, cigarettes, uncomplicated: Secondary | ICD-10-CM | POA: Diagnosis not present

## 2021-12-07 DIAGNOSIS — J45909 Unspecified asthma, uncomplicated: Secondary | ICD-10-CM | POA: Insufficient documentation

## 2021-12-07 DIAGNOSIS — R69 Illness, unspecified: Secondary | ICD-10-CM | POA: Diagnosis not present
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
## 2021-12-07 NOTE — Discharge Instructions (Signed)
Follow-up with local placement and as discussed.  Recheck with your primary care provider.  Return to ER for worsening or concerning symptoms.

## 2021-12-07 NOTE — ED Triage Notes (Signed)
Pt c/o chest pain that radiates to his left shoulder, shortness of breath and palpitations that started today on waking.

## 2021-12-07 NOTE — ED Notes (Signed)
Patient is being discharged from the Urgent Care and sent to the Emergency Department via POV . Per Reita May, FNP, patient is in need of higher level of care due to high level of care needed. Patient is aware and verbalizes understanding of plan of care.  Vitals:   12/07/21 1916  BP: (!) 165/99  Pulse: 81  Resp: 16  Temp: 98.1 F (36.7 C)  SpO2: 100%

## 2021-12-10 NOTE — Discharge Instructions (Signed)
Please go to the nearest emergency department for further evaluation.  
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
## 2021-12-16 ENCOUNTER — Other Ambulatory Visit (HOSPITAL_COMMUNITY): Payer: Self-pay

## 2021-12-17 ENCOUNTER — Other Ambulatory Visit (HOSPITAL_COMMUNITY): Payer: Self-pay

## 2021-12-20 ENCOUNTER — Ambulatory Visit (HOSPITAL_COMMUNITY)
Admission: EM | Admit: 2021-12-20 | Discharge: 2021-12-20 | Disposition: A | Discharge status: Home / Self Care | Payer: Medicare HMO | Attending: Physician Assistant | Admitting: Physician Assistant

## 2021-12-20 ENCOUNTER — Encounter (HOSPITAL_COMMUNITY): Payer: Self-pay | Admitting: Emergency Medicine

## 2021-12-20 ENCOUNTER — Ambulatory Visit (INDEPENDENT_AMBULATORY_CARE_PROVIDER_SITE_OTHER): Payer: Medicare HMO

## 2021-12-20 ENCOUNTER — Other Ambulatory Visit (HOSPITAL_COMMUNITY): Payer: Self-pay

## 2021-12-20 DIAGNOSIS — M79644 Pain in right finger(s): Secondary | ICD-10-CM | POA: Diagnosis not present

## 2021-12-20 DIAGNOSIS — S6991XA Unspecified injury of right wrist, hand and finger(s), initial encounter: Secondary | ICD-10-CM | POA: Diagnosis not present

## 2021-12-20 DIAGNOSIS — S60041A Contusion of right ring finger without damage to nail, initial encounter: Secondary | ICD-10-CM | POA: Diagnosis not present

## 2021-12-20 DIAGNOSIS — M20021 Boutonniere deformity of right finger(s): Secondary | ICD-10-CM | POA: Diagnosis not present

## 2021-12-20 DIAGNOSIS — M7989 Other specified soft tissue disorders: Secondary | ICD-10-CM | POA: Diagnosis not present

## 2021-12-20 MED ORDER — IBUPROFEN 600 MG PO TABS: 600.0000 mg | ORAL_TABLET | Freq: Three times a day (TID) | ORAL | 0 refills | Status: DC | PRN

## 2021-12-20 NOTE — ED Triage Notes (Signed)
Pt reports jammed right 4th finger about 3-4 days ago. Reports swelling and pain.

## 2021-12-20 NOTE — ED Provider Notes (Signed)
[facility]    CSN: [id number] Arrival date & time: 12/20/21  1125      History   Chief Complaint Chief Complaint  Patient presents with   Hand Pain    HPI Mike Harrison is a 61 y.o. male.   61 year old male presents with right fourth digit finger pain and swelling.  Patient indicates that about 3 to 4 days ago he was wrestling and he injured his right hand fourth digit.  He indicates that since he has been having swelling of the joint space along with pain when he bends or flexes the the fingers.  He also indicates that he is not able to bend flex the DIP joint of the fourth digit since injuring the finger, and indicates a great deal of pain when he tries to flex the PIP joint of the fourth digit of the finger.  Patient indicates he is not taking any OTC medicines for pain.  He does not remember how he injured his hand while he was wrestling.   Hand Pain    Past Medical History:  Diagnosis Date   Acute medial meniscus tear    Allergic reaction to alpha-gal 02/12/2021   Allergy to alpha-gal 11/10/2021   Anxiety    Arthritis    Arthritis    Asthma    Depression    Hyperlipidemia 12/31/2020   Hypertension 12/31/2020   Onychomycosis    Pruritus    Smoker    Toe pain     Patient Active Problem List   Diagnosis Date Noted   Allergy to alpha-gal 11/10/2021   Toe pain    Onychomycosis    Allergic reaction to alpha-gal 02/12/2021   Arthritis    Pruritus    Asthma    Smoker    Hypertension 12/31/2020   Hyperlipidemia 12/31/2020   HIV disease (HCC) 09/02/2019    Past Surgical History:  Procedure Laterality Date   HERNIA REPAIR         Home Medications    Prior to Admission medications   Medication Sig Start Date End Date Taking? Authorizing Provider  ibuprofen (ADVIL) 600 MG tablet Take 1 tablet (600 mg total) by mouth every 8 (eight) hours as needed. 12/20/21  Yes Ellsworth Lennox, PA-C  albuterol (VENTOLIN HFA) 108 (90 Base) MCG/ACT inhaler Inhale 2  puffs into the lungs every 2 (two) hours as needed for wheezing or shortness of breath (cough). 12/31/20   Randall Hiss, MD  bictegravir-emtricitabine-tenofovir AF (BIKTARVY) 50-200-25 MG TABS tablet Take 1 tablet by mouth daily. 11/10/21   Randall Hiss, MD  diclofenac (VOLTAREN) 75 MG EC tablet Take 1 tablet (75 mg total) by mouth 2 (two) times daily. 02/12/21   Randall Hiss, MD  fluticasone-salmeterol (ADVAIR [facility]) [phone] MCG/ACT inhaler Inhale 2 puffs into the lungs 2 (two) times daily. 02/12/21   Randall Hiss, MD  mirtazapine (REMERON) 15 MG tablet Take 1 tablet (15 mg total) by mouth at bedtime. 12/31/20   Randall Hiss, MD  Pitavastatin Magnesium 4 MG TABS Take 1 tablet by mouth daily. 11/10/21   Randall Hiss, MD    Family History Family History  Problem Relation Age of Onset   Cancer Mother    Hypertension Mother    Cirrhosis Father     Social History Social History   Tobacco Use   Smoking status: Some Days    Packs/day: 0.50    Types: Cigarettes   Smokeless tobacco:  Never   Tobacco comments:    Changed to Black and Mild and slowed down smoking. 11/10/21  Vaping Use   Vaping Use: Never used  Substance Use Topics   Alcohol use: Never   Drug use: Never     Allergies   Hydrochlorothiazide, Lisinopril, and Bactrim [sulfamethoxazole-trimethoprim]   Review of Systems Review of Systems  Musculoskeletal:  Positive for joint swelling (right 4th digit hand).     Physical Exam Triage Vital Signs ED Triage Vitals  Enc Vitals Group     BP 12/20/21 1234 (!) 150/88     Pulse Rate 12/20/21 1234 (!) 55     Resp 12/20/21 1234 17     Temp 12/20/21 1234 98.3 F (36.8 C)     Temp Source 12/20/21 1234 Oral     SpO2 12/20/21 1234 100 %     Weight --      Height --      Head Circumference --      Peak Flow --      Pain Score 12/20/21 1233 3     Pain Loc --      Pain Edu? --      Excl. in [place]? --    No data found.  Updated Vital  Signs BP (!) 150/88 (BP Location: Left Arm)   Pulse (!) 55   Temp 98.3 F (36.8 C) (Oral)   Resp 17   SpO2 100%   Visual Acuity Right Eye Distance:   Left Eye Distance:   Bilateral Distance:    Right Eye Near:   Left Eye Near:    Bilateral Near:     Physical Exam Constitutional:      Appearance: Normal appearance.  Musculoskeletal:     Comments: Right hand: There is a boutonniere type deformity of the fourth DIP joint area with limited range of motion and the patient being unable to flex that joint space.  There is 1+ swelling of the PIP joint space with pain on palpation, pain is elicited with flexion and extension, stability is intact, no crepitus with motion.  Neurological:     Mental Status: He is alert.      UC Treatments / Results  Labs (all labs ordered are listed, but only abnormal results are displayed) Labs Reviewed - No data to display  EKG   Radiology DG Finger Ring Right  Result Date: 12/20/2021 CLINICAL DATA:  Trauma, pain EXAM: RIGHT RING FINGER 2+V COMPARISON:  None Available. FINDINGS: No fracture or dislocation is seen. There is soft tissue swelling, especially over the PIP joint. No opaque foreign bodies are seen. Small bony spurs seen in third and fourth metacarpophalangeal joints. IMPRESSION: No fracture or dislocation is seen in right ring finger. Electronically Signed   By: Elmer Picker M.D.   On: 12/20/2021 13:11    Procedures Procedures (including critical care time)  Medications Ordered in UC Medications - No data to display  Initial Impression / Assessment and Plan / UC Course  I have reviewed the triage vital signs and the nursing notes.  Pertinent labs & imaging results that were available during my care of the patient were reviewed by me and considered in my medical decision making (see chart for details).       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  Acute uncomplicated: Right fourth digit finger contusion, and DIP boutonniere deformity. A.  Finger splint applied to treat the contusion and modified boutonniere deformity. B.  Ibuprofen 600 mg every 8 hours with food to help reduce pain and swelling of the finger swelling and boutonniere deformity. C.  Advised to follow-up with PCP or consider Ortho evaluation if area fails to improve over the next 2 weeks.      Discharge Instructions      Advised take ibuprofen 600 mg every 8 hours to help reduce pain and swelling. Advised to wear a finger brace for the next couple weeks to help assist in healing of the joint space of the DIP and PIP areas. Advised to follow-up with Ortho if symptoms fail to improve within the next week to 10 days.     ED Prescriptions     Medication Sig Dispense Auth. Provider   ibuprofen (ADVIL) 600 MG tablet Take 1 tablet (600 mg total) by mouth every 8 (eight) hours as needed. 30 tablet Ellsworth Lennox, PA-C      PDMP not reviewed this encounter.   Ellsworth Lennox, PA-C 12/20/21 1338

## 2021-12-20 NOTE — Discharge Instructions (Addendum)
Advised take ibuprofen 600 mg every 8 hours to help reduce pain and swelling. Advised to wear a finger brace for the next couple weeks to help assist in healing of the joint space of the DIP and PIP areas. Advised to follow-up with Ortho if symptoms fail to improve within the next week to 10 days.

## 2021-12-21 ENCOUNTER — Other Ambulatory Visit (HOSPITAL_COMMUNITY): Payer: Self-pay

## 2021-12-23 ENCOUNTER — Ambulatory Visit: Payer: Self-pay | Admitting: Podiatry

## 2021-12-30 ENCOUNTER — Ambulatory Visit (INDEPENDENT_AMBULATORY_CARE_PROVIDER_SITE_OTHER): Payer: Medicare HMO | Admitting: Sports Medicine

## 2021-12-30 VITALS — BP 159/99 | Ht 71.0 in | Wt 220.0 lb

## 2021-12-30 DIAGNOSIS — M25562 Pain in left knee: Secondary | ICD-10-CM | POA: Diagnosis not present

## 2021-12-30 DIAGNOSIS — M25561 Pain in right knee: Secondary | ICD-10-CM | POA: Diagnosis not present

## 2021-12-30 MED ORDER — METHYLPREDNISOLONE ACETATE 40 MG/ML IJ SUSP
40.0000 mg | Freq: Once | INTRAMUSCULAR | Status: AC
  Administered 2021-12-30: 40 mg via INTRA_ARTICULAR

## 2021-12-30 NOTE — Progress Notes (Signed)
Mike Harrison - 61 y.o. male MRN [id number]  Date of birth: 1960-09-02    CHIEF COMPLAINT:   Bilateral knee pain    SUBJECTIVE:   HPI:  Pleasant 61 year old male comes clinic related for bilateral knee pain.  Left greater than right.  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  He also sustained a left meniscus tear in the late 80s early 90s.  Never had surgery for this and just cortisone shots.  His knees have gradually gotten worse over the years.  He was previously followed at a sports medicine clinic in [place] and last had x-rays and cortisone injections there around 2016.  He was told he would likely need a knee replacement at some point.  Today he comes in because his knee pain has been flaring up more frequently over the last several months.  His left knee specifically is quite painful on the medial aspect.  Right knee is painful in a similar spot but is not as bad.  Knee pain is worse after he plays basketball with his sons.  He reports mechanical symptoms of catching and locking in the left knee when trying to play basketball.  He also has weakness and feels like the left knee gives out whenever he tries to cut.  He has generally been trying ibuprofen 600 mg, ice, topical Voltaren and wearing braces but the knee is not feeling any better.  ROS:     See HPI  PERTINENT  PMH / PSH FH / / SH:  Past Medical, Surgical, Social, and Family History Reviewed & Updated in the EMR.  Pertinent findings include:  none  OBJECTIVE: BP (!) 159/99   Ht 5\' 11"  (1.803 m)   Wt 220 lb (99.8 kg)   BMI 30.68 kg/m   Physical Exam:  Vital signs are reviewed.  GEN: Alert and oriented, NAD Pulm: Breathing unlabored PSY: normal mood, congruent affect  MSK: L Knee -small effusion.  Tender to palpation at medial and lateral joint lines.  Nontender over patella.  Full range of motion.  5/5 strength flexion and extension.  No  ligamentous instability with valgus or varus stressing.  Negative Lachman.  Positive Thessaly test.  R Knee - no effusion.  Nontender to palpation at medial and lateral joint lines.  Nontender over patella.  Full range of motion.  5/5 strength flexion and extension.  No ligamentous instability with valgus or varus stressing.  Negative Lachman.  Negative Thessaly test.  ASSESSMENT & PLAN:  1.  Bilateral knee pain likely secondary to arthritis and L degenerative meniscus tear -Acute on chronic worsening knee pain, left greater than right.  Suspect degenerative changes on top of degenerative meniscus tearing on the left.  It has been about 7 years since he last had cortisone injection and he would like to proceed with bilateral injections today.  I will also get an x-ray of his knees to evaluate his joint space.  If no improvement with steroid injections, he may be a candidate for gel injections or he may ultimately need a knee replacement depending on the integrity of his joint space.  All questions answered agrees to plan.  Follow-up in 2 months or so.  Procedure performed:  Bilateral Knee Intraarticular Corticosteroid Injection; palpation guided  Consent obtained and verified. Time-out conducted. Noted no overlying erythema, induration, or other signs of local infection. The bilateral anterior lateral joint spaces was palpated and marked. The overlying  skin was prepped in a sterile fashion. Topical analgesic spray: Ethyl chloride. Needle: 25 gauge, 1.5 inch Meds: 40 mg methylrprednisolone, 4 ml 1% lidocaine without epinephrine (x2) Completed without difficulty  Advised to call if fevers/chills, erythema, induration, drainage, or persistent bleeding.    Dortha Kern, MD PGY-4, Sports Medicine Fellow [place]  Patient seen and evaluated with the sports medicine fellow.  I agree with the above plan of care.  Cortisone injections administered as above.  We will  order x-rays of his knees to evaluate the degree of osteoarthritis present.  Future treatment options will depend on response to today's injections and x-ray results.

## 2022-01-11 ENCOUNTER — Other Ambulatory Visit (HOSPITAL_COMMUNITY): Payer: Self-pay

## 2022-01-12 ENCOUNTER — Other Ambulatory Visit (HOSPITAL_COMMUNITY): Payer: Self-pay

## 2022-01-13 ENCOUNTER — Other Ambulatory Visit (HOSPITAL_COMMUNITY): Payer: Self-pay

## 2022-01-13 ENCOUNTER — Ambulatory Visit: Payer: Self-pay | Admitting: Podiatry

## 2022-01-15 ENCOUNTER — Encounter (HOSPITAL_COMMUNITY): Payer: Self-pay

## 2022-01-15 ENCOUNTER — Ambulatory Visit (HOSPITAL_COMMUNITY)
Admission: EM | Admit: 2022-01-15 | Discharge: 2022-01-15 | Disposition: A | Discharge status: Home / Self Care | Payer: Medicare HMO | Attending: Emergency Medicine | Admitting: Emergency Medicine

## 2022-01-15 DIAGNOSIS — J4520 Mild intermittent asthma, uncomplicated: Secondary | ICD-10-CM | POA: Diagnosis not present

## 2022-01-15 DIAGNOSIS — J069 Acute upper respiratory infection, unspecified: Secondary | ICD-10-CM | POA: Diagnosis not present

## 2022-01-15 MED ORDER — PREDNISONE 20 MG PO TABS: 40.0000 mg | ORAL_TABLET | Freq: Every day | ORAL | 0 refills | Status: AC

## 2022-01-15 NOTE — Discharge Instructions (Signed)
I recommend mucinex (guaifenesin) twice daily with lots of water. This is good for cough and congestion. You could also try benadryl at night to dry out the sinuses.  Take the prednisone as prescribed.  Continue to use your inhaler every 6 hours as needed.  Please follow up with your primary care provider. Please go to the emergency department if symptoms worsen.

## 2022-01-15 NOTE — ED Provider Notes (Signed)
Mike Harrison    CSN: [id number] Arrival date & time: 01/15/22  1009      History   Chief Complaint Chief Complaint  Patient presents with   Cough   Nasal Congestion    whee   Wheezing    HPI Mike Harrison is a 61 y.o. male.  Presents with 3-day history of nasal congestion, productive cough, chest tightness Denies fever. No GI symptoms No chest pain or shortness of breath Has tried TheraFlu for symptoms No known sick contacts  History of asthma, uses inhaler very infrequently Occasional smoker  Past Medical History:  Diagnosis Date   Acute medial meniscus tear    Allergic reaction to alpha-gal 02/12/2021   Allergy to alpha-gal 11/10/2021   Anxiety    Arthritis    Arthritis    Asthma    Depression    Hyperlipidemia 12/31/2020   Hypertension 12/31/2020   Onychomycosis    Pruritus    Smoker    Toe pain     Patient Active Problem List   Diagnosis Date Noted   Allergy to alpha-gal 11/10/2021   Toe pain    Onychomycosis    Allergic reaction to alpha-gal 02/12/2021   Arthritis    Pruritus    Asthma    Smoker    Hypertension 12/31/2020   Hyperlipidemia 12/31/2020   HIV disease ([place]) 09/02/2019    Past Surgical History:  Procedure Laterality Date   HERNIA REPAIR         Home Medications    Prior to Admission medications   Medication Sig Start Date End Date Taking? Authorizing Provider  predniSONE (DELTASONE) 20 MG tablet Take 2 tablets (40 mg total) by mouth daily with breakfast for 5 days. 01/15/22 01/20/22 Yes Trevel Dillenbeck, Wells Guiles, PA-C  albuterol (VENTOLIN HFA) 108 (90 Base) MCG/ACT inhaler Inhale 2 puffs into the lungs every 2 (two) hours as needed for wheezing or shortness of breath (cough). 12/31/20   Truman Hayward, MD  bictegravir-emtricitabine-tenofovir AF (BIKTARVY) 50-200-25 MG TABS tablet Take 1 tablet by mouth daily. 11/10/21   Truman Hayward, MD  diclofenac (VOLTAREN) 75 MG EC tablet Take 1 tablet (75 mg total) by mouth 2  (two) times daily. 02/12/21   Truman Hayward, MD  mirtazapine (REMERON) 15 MG tablet Take 1 tablet (15 mg total) by mouth at bedtime. 12/31/20   Truman Hayward, MD  Pitavastatin Magnesium 4 MG TABS Take 1 tablet by mouth daily. 11/10/21   Truman Hayward, MD    Family History Family History  Problem Relation Age of Onset   Cancer Mother    Hypertension Mother    Cirrhosis Father     Social History Social History   Tobacco Use   Smoking status: Some Days    Packs/day: 0.50    Types: Cigarettes   Smokeless tobacco: Never   Tobacco comments:    Changed to Black and Mild and slowed down smoking. 11/10/21  Vaping Use   Vaping Use: Never used  Substance Use Topics   Alcohol use: Never   Drug use: Never     Allergies   Hydrochlorothiazide, Lisinopril, and Bactrim [sulfamethoxazole-trimethoprim]   Review of Systems Review of Systems  Respiratory:  Positive for cough.    Per HPI  Physical Exam Triage Vital Signs ED Triage Vitals  Enc Vitals Group     BP 01/15/22 1116 (!) 167/97     Pulse Rate 01/15/22 1116 62     Resp  01/15/22 1116 12     Temp 01/15/22 1116 98 F (36.7 C)     Temp Source 01/15/22 1116 Oral     SpO2 01/15/22 1116 99 %     Weight --      Height --      Head Circumference --      Peak Flow --      Pain Score 01/15/22 1115 0     Pain Loc --      Pain Edu? --      Excl. in [place]? --    No data found.  Updated Vital Signs BP (!) 167/97 (BP Location: Left Arm)   Pulse 62   Temp 98 F (36.7 C) (Oral)   Resp 12   SpO2 99%    Physical Exam Vitals and nursing note reviewed.  Constitutional:      General: He is not in acute distress.    Appearance: Normal appearance. He is not ill-appearing or diaphoretic.  HENT:     Nose: Congestion present.     Mouth/Throat:     Mouth: Mucous membranes are moist.     Pharynx: Uvula midline. No posterior oropharyngeal erythema.     Tonsils: No tonsillar exudate or tonsillar abscesses.  Eyes:      Conjunctiva/sclera: Conjunctivae normal.  Cardiovascular:     Rate and Rhythm: Normal rate and regular rhythm.     Heart sounds: Normal heart sounds.  Pulmonary:     Effort: Pulmonary effort is normal. No respiratory distress.     Breath sounds: Normal breath sounds. No wheezing, rhonchi or rales.  Musculoskeletal:     Cervical back: Normal range of motion.  Lymphadenopathy:     Cervical: No cervical adenopathy.  Skin:    General: Skin is warm and dry.  Neurological:     Mental Status: He is alert and oriented to person, place, and time.     UC Treatments / Results  Labs (all labs ordered are listed, but only abnormal results are displayed) Labs Reviewed - No data to display  EKG  Radiology No results found.  Procedures Procedures (including critical care time)  Medications Ordered in UC Medications - No data to display  Initial Impression / Assessment and Plan / UC Course  I have reviewed the triage vital signs and the nursing notes.  Pertinent labs & imaging results that were available during my care of the patient were reviewed by me and considered in my medical decision making (see chart for details).  Well-appearing, afebrile, clear lungs Offered chest x-ray given age, productive cough, smoking status. Patient declined Patient declined viral testing today He is requesting antibiotics for his cough and congestion.  At this time I believe he has a viral upper respiratory infection.  Discussed with patient antibiotics are not warranted at this time. I recommend symptomatic care, including Mucinex 650 mg BID. Lots of fluids. With chest tightness with cough, could be asthma flareup.  Prednisone 40 mg daily for the next 5 days to prevent exacerbation. Discussed ED precautions, patient agrees to plan  Final Clinical Impressions(s) / UC Diagnoses   Final diagnoses:  Viral upper respiratory tract infection  Mild intermittent asthma without complication      Discharge Instructions      I recommend mucinex (guaifenesin) twice daily with lots of water. This is good for cough and congestion. You could also try benadryl at night to dry out the sinuses.  Take the prednisone as prescribed.  Continue to use your  inhaler every 6 hours as needed.  Please follow up with your primary care provider. Please go to the emergency department if symptoms worsen.    ED Prescriptions     Medication Sig Dispense Auth. Provider   predniSONE (DELTASONE) 20 MG tablet Take 2 tablets (40 mg total) by mouth daily with breakfast for 5 days. 10 tablet Derrika Ruffalo, Wells Guiles, PA-C      PDMP not reviewed this encounter.   Lowry Bala, Wells Guiles, [place] 01/15/22 1222

## 2022-01-15 NOTE — ED Triage Notes (Signed)
Pt is here for cough, wheezing and nasal congestion xfew days

## 2022-01-17 ENCOUNTER — Other Ambulatory Visit (HOSPITAL_COMMUNITY): Payer: Self-pay

## 2022-01-17 ENCOUNTER — Telehealth: Payer: Self-pay

## 2022-01-17 NOTE — Telephone Encounter (Signed)
Mike Harrison called and stated that he has been "clogged up" and experiencing symptoms of asthma and an upper respiratory infection. States he has a history of URIs. Went to urgent care on Saturday, where he was prescribed prednisone, and states NP recommended Mucinex. Patient states he tried Mucinex but it was not helpful.  States that in the past, he recovered when both prednisone and a "z pack" were prescribed simultaneously. He requested this from urgent care, who made him a follow up appointment for tomorrow at [place].  Patient requesting a "z pack" prescription. Routed to provider.  Binnie Kand, RN

## 2022-01-17 NOTE — Telephone Encounter (Signed)
Called patient and relayed Dr. Lucianne Lei Dam's message verbatim. Patient verbalized understanding. States he has tried OTC remedies before, but will follow up tomorrow by keeping his appointment at urgent care.  Binnie Kand, RN

## 2022-01-18 ENCOUNTER — Ambulatory Visit (HOSPITAL_COMMUNITY): Payer: Medicare HMO

## 2022-01-20 ENCOUNTER — Other Ambulatory Visit (HOSPITAL_COMMUNITY): Payer: Self-pay

## 2022-01-25 ENCOUNTER — Ambulatory Visit: Payer: Medicare HMO | Admitting: Podiatry

## 2022-01-25 ENCOUNTER — Other Ambulatory Visit (HOSPITAL_COMMUNITY): Payer: Self-pay

## 2022-02-02 ENCOUNTER — Ambulatory Visit (INDEPENDENT_AMBULATORY_CARE_PROVIDER_SITE_OTHER): Payer: Self-pay | Admitting: Podiatry

## 2022-02-02 DIAGNOSIS — Z91199 Patient's noncompliance with other medical treatment and regimen due to unspecified reason: Secondary | ICD-10-CM

## 2022-02-03 NOTE — Progress Notes (Signed)
Patient was no-show for appointment today 

## 2022-02-11 ENCOUNTER — Other Ambulatory Visit (HOSPITAL_COMMUNITY): Payer: Self-pay

## 2022-02-14 ENCOUNTER — Other Ambulatory Visit (HOSPITAL_COMMUNITY): Payer: Self-pay

## 2022-02-16 ENCOUNTER — Other Ambulatory Visit (HOSPITAL_COMMUNITY): Payer: Self-pay

## 2022-02-21 ENCOUNTER — Other Ambulatory Visit (HOSPITAL_COMMUNITY): Payer: Self-pay

## 2022-03-17 ENCOUNTER — Other Ambulatory Visit: Payer: Self-pay

## 2022-03-24 ENCOUNTER — Other Ambulatory Visit (HOSPITAL_COMMUNITY): Payer: Self-pay

## 2022-03-31 ENCOUNTER — Ambulatory Visit: Payer: Medicare HMO | Admitting: Sports Medicine

## 2022-04-13 ENCOUNTER — Other Ambulatory Visit: Payer: Self-pay

## 2022-04-13 ENCOUNTER — Encounter (HOSPITAL_BASED_OUTPATIENT_CLINIC_OR_DEPARTMENT_OTHER): Payer: Self-pay | Admitting: Emergency Medicine

## 2022-04-13 ENCOUNTER — Emergency Department (HOSPITAL_BASED_OUTPATIENT_CLINIC_OR_DEPARTMENT_OTHER): Payer: Medicare HMO

## 2022-04-13 DIAGNOSIS — R200 Anesthesia of skin: Secondary | ICD-10-CM | POA: Diagnosis not present

## 2022-04-13 DIAGNOSIS — J45909 Unspecified asthma, uncomplicated: Secondary | ICD-10-CM | POA: Insufficient documentation

## 2022-04-13 DIAGNOSIS — F17210 Nicotine dependence, cigarettes, uncomplicated: Secondary | ICD-10-CM | POA: Insufficient documentation

## 2022-04-13 DIAGNOSIS — G510 Bell's palsy: Secondary | ICD-10-CM | POA: Diagnosis not present

## 2022-04-13 DIAGNOSIS — R69 Illness, unspecified: Secondary | ICD-10-CM | POA: Diagnosis not present

## 2022-04-13 DIAGNOSIS — I10 Essential (primary) hypertension: Secondary | ICD-10-CM | POA: Insufficient documentation

## 2022-04-13 DIAGNOSIS — R29810 Facial weakness: Secondary | ICD-10-CM | POA: Diagnosis not present

## 2022-04-13 DIAGNOSIS — R4182 Altered mental status, unspecified: Secondary | ICD-10-CM | POA: Diagnosis not present

## 2022-04-13 DIAGNOSIS — H538 Other visual disturbances: Secondary | ICD-10-CM | POA: Diagnosis present

## 2022-04-13 LAB — CBC WITH DIFFERENTIAL/PLATELET
Abs Immature Granulocytes: 0.02 10*3/uL (ref 0.00–0.07)
Basophils Absolute: 0.1 10*3/uL (ref 0.0–0.1)
Basophils Relative: 1 %
Eosinophils Absolute: 0.3 10*3/uL (ref 0.0–0.5)
Eosinophils Relative: 4 %
HCT: 41 % (ref 39.0–52.0)
Hemoglobin: 14.5 g/dL (ref 13.0–17.0)
Immature Granulocytes: 0 %
Lymphocytes Relative: 28 %
Lymphs Abs: 1.8 10*3/uL (ref 0.7–4.0)
MCH: 32.2 pg (ref 26.0–34.0)
MCHC: 35.4 g/dL (ref 30.0–36.0)
MCV: 91.1 fL (ref 80.0–100.0)
Monocytes Absolute: 0.6 10*3/uL (ref 0.1–1.0)
Monocytes Relative: 9 %
Neutro Abs: 3.7 10*3/uL (ref 1.7–7.7)
Neutrophils Relative %: 58 %
Platelets: 321 10*3/uL (ref 150–400)
RBC: 4.5 MIL/uL (ref 4.22–5.81)
RDW: 12 % (ref 11.5–15.5)
WBC: 6.4 10*3/uL (ref 4.0–10.5)
nRBC: 0 % (ref 0.0–0.2)

## 2022-04-13 NOTE — ED Triage Notes (Signed)
Reports blurry vision x a couple days. And facial droop since yesterday.  Reports numbness and not being able to taste. Eye is burning

## 2022-04-14 ENCOUNTER — Emergency Department (HOSPITAL_BASED_OUTPATIENT_CLINIC_OR_DEPARTMENT_OTHER)
Admission: EM | Admit: 2022-04-14 | Discharge: 2022-04-14 | Disposition: A | Discharge status: Home / Self Care | Payer: Medicare HMO | Attending: Emergency Medicine | Admitting: Emergency Medicine

## 2022-04-14 DIAGNOSIS — G510 Bell's palsy: Secondary | ICD-10-CM

## 2022-04-14 LAB — BASIC METABOLIC PANEL
Anion gap: 8 (ref 5–15)
BUN: 8 mg/dL (ref 6–20)
CO2: 24 mmol/L (ref 22–32)
Calcium: 9.1 mg/dL (ref 8.9–10.3)
Chloride: 105 mmol/L (ref 98–111)
Creatinine, Ser: 1.09 mg/dL (ref 0.61–1.24)
GFR, Estimated: >60 mL/min (ref >60)
Glucose, Bld: 105 mg/dL — ABNORMAL HIGH (ref 70–99)
Potassium: 3.5 mmol/L (ref 3.5–5.1)
Sodium: 137 mmol/L (ref 135–145)

## 2022-04-14 MED ORDER — PREDNISONE 50 MG PO TABS
60.0000 mg | ORAL_TABLET | Freq: Once | ORAL | Status: AC
  Administered 2022-04-14: 60 mg via ORAL
  Filled 2022-04-14: qty 1

## 2022-04-14 MED ORDER — PREDNISONE 20 MG PO TABS
60.0000 mg | ORAL_TABLET | Freq: Every day | ORAL | 0 refills | Status: AC
  Filled 2022-04-19: qty 21, 7d supply, fill #0

## 2022-04-14 MED ORDER — VALACYCLOVIR HCL 1 G PO TABS
1000.0000 mg | ORAL_TABLET | Freq: Three times a day (TID) | ORAL | 0 refills | Status: AC
  Filled 2022-04-19: qty 21, 7d supply, fill #0

## 2022-04-14 NOTE — ED Provider Notes (Signed)
[facility] Emergency Department Provider Note MRN:  [id number]  Arrival date & time: 04/14/22     Chief Complaint   Eye irritation History of Present Illness   Mike Harrison is a 62 y.o. year-old male with no pertinent past medical history presenting to the ED with chief complaint of eye irritation.  For the past 5 days or so he has had a sand paper feeling to the right eye, blurry, glassy, tearing on occasion.  And now for the past 4 days he is experienced difficulty closing the right eyelid, right facial droop.  No numbness or weakness to the arms or legs, no headache, no trouble swallowing, some loss of taste.  Review of Systems  A thorough review of systems was obtained and all systems are negative except as noted in the HPI and PMH.   Patient's Health History    Past Medical History:  Diagnosis Date   Acute medial meniscus tear    Allergic reaction to alpha-gal 02/12/2021   Allergy to alpha-gal 11/10/2021   Anxiety    Arthritis    Arthritis    Asthma    Depression    Hyperlipidemia 12/31/2020   Hypertension 12/31/2020   Onychomycosis    Pruritus    Smoker    Toe pain     Past Surgical History:  Procedure Laterality Date   HERNIA REPAIR      Family History  Problem Relation Age of Onset   Cancer Mother    Hypertension Mother    Cirrhosis Father     Social History   Socioeconomic History   Marital status: Widowed    Spouse name: Not on file   Number of children: Not on file   Years of education: Not on file   Highest education level: Not on file  Occupational History   Not on file  Tobacco Use   Smoking status: Some Days    Packs/day: 0.50    Types: Cigarettes   Smokeless tobacco: Never   Tobacco comments:    Changed to Black and Mild and slowed down smoking. 11/10/21  Vaping Use   Vaping Use: Never used  Substance and Sexual Activity   Alcohol use: Never   Drug use: Never   Sexual activity: Not on file  Other Topics Concern    Not on file  Social History Narrative   Not on file   Social Determinants of Health   Financial Resource Strain: Not on file  Food Insecurity: Not on file  Transportation Needs: Not on file  Physical Activity: Not on file  Stress: Not on file  Social Connections: Not on file  Intimate Partner Violence: Not on file     Physical Exam   Vitals:   04/14/22 0100 04/14/22 0115  BP: (!) 163/103   Pulse: (!) 51 (!) 52  Resp:    Temp:    SpO2: 100% 99%    CONSTITUTIONAL: Well-appearing, NAD NEURO/PSYCH:  Alert and oriented x 3, no focal deficits EYES:  eyes equal and reactive ENT/NECK:  no LAD, no JVD CARDIO: Regular rate, well-perfused, normal S1 and S2 PULM:  CTAB no wheezing or rhonchi GI/GU:  non-distended, non-tender MSK/SPINE:  No gross deformities, no edema SKIN:  no rash, atraumatic   *Additional and/or pertinent findings included in MDM below  Diagnostic and Interventional Summary    EKG Interpretation  Date/Time:    Ventricular Rate:    PR Interval:    QRS Duration:   QT Interval:  QTC Calculation:   R Axis:     Text Interpretation:         Labs Reviewed  BASIC METABOLIC PANEL - Abnormal; Notable for the following components:      Result Value   Glucose, Bld 105 (*)    All other components within normal limits  CBC WITH DIFFERENTIAL/PLATELET    CT Head Wo Contrast  Final Result      Medications  predniSONE (DELTASONE) tablet 60 mg (60 mg Oral Given 04/14/22 0157)     Procedures  /  Critical Care Procedures  ED Course and Medical Decision Making  Initial Impression and Ddx Exam is consistent with Bell's palsy.  Has clear involvement of the forehead and periorbital region on the right as well as right facial droop.  No other neurological deficits.  His description of eye discomfort seems most consistent with dry eye from lack of ability to close the eye.  He does not really have pain and so I doubt corneal abrasion or any other significant  pathology.  Screening labs and CT head in triage are normal.  Past medical/surgical history that increases complexity of ED encounter: None  Interpretation of Diagnostics I personally reviewed the laboratory assessment and my interpretation is as follows: No significant blood count or electrolyte disturbance  CT head normal  Patient Reassessment and Ultimate Disposition/Management     Appropriate for discharge with treatment of Bell's with prednisone and valacyclovir.  Patient management required discussion with the following services or consulting groups:  None  Complexity of Problems Addressed Acute illness or injury that poses threat of life of bodily function  Additional Data Reviewed and Analyzed Further history obtained from: Further history from spouse/family member and Past medical history and medications listed in the EMR  Additional Factors Impacting ED Encounter Risk Prescriptions  Barth Kirks. Sedonia Small, Angelina [email or web address]  Final Clinical Impressions(s) / ED Diagnoses     ICD-10-CM   1. Bell's palsy  G51.0       ED Discharge Orders          Ordered    valACYclovir (VALTREX) 1000 MG tablet  3 times daily        04/14/22 0154    predniSONE (DELTASONE) 20 MG tablet  Daily        04/14/22 0154             Discharge Instructions Discussed with and Provided to Patient:    Discharge Instructions      You were evaluated in the Emergency Department and after careful evaluation, we did not find any emergent condition requiring admission or further testing in the hospital.  Your exam/testing today is overall reassuring.  Symptoms seem to be due to Bell's palsy.  Take the prednisone and valacyclovir medications as prescribed and follow-up with your regular doctor.  Please return to the Emergency Department if you experience any worsening of your condition.   Thank you for allowing [place] to be a part of  your care.      Maudie Flakes, MD 04/14/22 [phone]

## 2022-04-14 NOTE — ED Notes (Signed)
Reviewed AVS/discharge instruction with patient. Time allotted for and all questions answered. Patient is agreeable for d/c and escorted to ed exit by staff.  

## 2022-04-14 NOTE — Discharge Instructions (Signed)
You were evaluated in the Emergency Department and after careful evaluation, we did not find any emergent condition requiring admission or further testing in the hospital.  Your exam/testing today is overall reassuring.  Symptoms seem to be due to Bell's palsy.  Take the prednisone and valacyclovir medications as prescribed and follow-up with your regular doctor.  Please return to the Emergency Department if you experience any worsening of your condition.   Thank you for allowing [place] to be a part of your care.

## 2022-04-18 ENCOUNTER — Other Ambulatory Visit: Payer: Self-pay

## 2022-04-18 ENCOUNTER — Other Ambulatory Visit (HOSPITAL_COMMUNITY): Payer: Self-pay

## 2022-04-18 ENCOUNTER — Other Ambulatory Visit: Payer: Self-pay | Admitting: Infectious Disease

## 2022-04-18 MED ORDER — MIRTAZAPINE 15 MG PO TABS
15.0000 mg | ORAL_TABLET | Freq: Every day | ORAL | 5 refills | Status: DC
  Filled 2022-04-18: qty 30, 30d supply, fill #0

## 2022-04-18 NOTE — Telephone Encounter (Signed)
Please advise if okay to refill.  

## 2022-04-19 ENCOUNTER — Other Ambulatory Visit (HOSPITAL_COMMUNITY): Payer: Self-pay

## 2022-04-28 ENCOUNTER — Other Ambulatory Visit (HOSPITAL_COMMUNITY): Payer: Self-pay

## 2022-05-04 ENCOUNTER — Ambulatory Visit (INDEPENDENT_AMBULATORY_CARE_PROVIDER_SITE_OTHER): Payer: Medicare HMO

## 2022-05-04 ENCOUNTER — Encounter (HOSPITAL_COMMUNITY): Payer: Self-pay

## 2022-05-04 ENCOUNTER — Emergency Department (HOSPITAL_COMMUNITY)
Admission: EM | Admit: 2022-05-04 | Discharge: 2022-05-04 | Disposition: A | Discharge status: Home / Self Care | Payer: Medicare HMO | Attending: Emergency Medicine | Admitting: Emergency Medicine

## 2022-05-04 ENCOUNTER — Other Ambulatory Visit: Payer: Self-pay

## 2022-05-04 ENCOUNTER — Ambulatory Visit (HOSPITAL_COMMUNITY): Payer: Medicare HMO

## 2022-05-04 ENCOUNTER — Ambulatory Visit (HOSPITAL_COMMUNITY)
Admission: EM | Admit: 2022-05-04 | Discharge: 2022-05-04 | Disposition: A | Discharge status: Home / Self Care | Payer: Medicare HMO | Attending: Internal Medicine | Admitting: Internal Medicine

## 2022-05-04 DIAGNOSIS — H66002 Acute suppurative otitis media without spontaneous rupture of ear drum, left ear: Secondary | ICD-10-CM

## 2022-05-04 DIAGNOSIS — R1030 Lower abdominal pain, unspecified: Secondary | ICD-10-CM | POA: Diagnosis not present

## 2022-05-04 DIAGNOSIS — R102 Pelvic and perineal pain: Secondary | ICD-10-CM | POA: Diagnosis not present

## 2022-05-04 DIAGNOSIS — J45909 Unspecified asthma, uncomplicated: Secondary | ICD-10-CM | POA: Diagnosis not present

## 2022-05-04 DIAGNOSIS — M25551 Pain in right hip: Secondary | ICD-10-CM

## 2022-05-04 DIAGNOSIS — Z20822 Contact with and (suspected) exposure to covid-19: Secondary | ICD-10-CM | POA: Diagnosis not present

## 2022-05-04 DIAGNOSIS — J069 Acute upper respiratory infection, unspecified: Secondary | ICD-10-CM

## 2022-05-04 DIAGNOSIS — H9202 Otalgia, left ear: Secondary | ICD-10-CM | POA: Diagnosis not present

## 2022-05-04 DIAGNOSIS — Z7951 Long term (current) use of inhaled steroids: Secondary | ICD-10-CM | POA: Diagnosis not present

## 2022-05-04 DIAGNOSIS — B9789 Other viral agents as the cause of diseases classified elsewhere: Secondary | ICD-10-CM | POA: Diagnosis not present

## 2022-05-04 DIAGNOSIS — M25552 Pain in left hip: Secondary | ICD-10-CM | POA: Diagnosis not present

## 2022-05-04 LAB — RESP PANEL BY RT-PCR (RSV, FLU A&B, COVID)  RVPGX2
Influenza A by PCR: NEGATIVE
Influenza B by PCR: NEGATIVE
Resp Syncytial Virus by PCR: NEGATIVE
SARS Coronavirus 2 by RT PCR: NEGATIVE

## 2022-05-04 MED ORDER — BENZONATATE 100 MG PO CAPS: 100.0000 mg | ORAL_CAPSULE | Freq: Three times a day (TID) | ORAL | 0 refills | Status: DC

## 2022-05-04 MED ORDER — AMOXICILLIN-POT CLAVULANATE 875-125 MG PO TABS: 1.0000 | ORAL_TABLET | Freq: Two times a day (BID) | ORAL | 0 refills | Status: DC

## 2022-05-04 NOTE — ED Provider Triage Note (Signed)
Emergency Medicine Provider Triage Evaluation Note  Mike Harrison , a 62 y.o. male  was evaluated in triage.  Pt complains of complains of sinus pressure, headache, nasal congestion and cough.  History of asthma no active wheezing or shortness of breath.  Review of Systems  Positive: Nasal congestion Negative: fever  Physical Exam  BP (!) 138/90 (BP Location: Right Arm)   Pulse 96   Temp 98.1 F (36.7 C) (Oral)   Resp 17   SpO2 96%  Gen:   Awake, no distress   Resp:  Normal effort  MSK:   Moves extremities without difficulty  Other:    Medical Decision Making  Medically screening exam initiated at 6:47 PM.  Appropriate orders placed.  Mike Harrison was informed that the remainder of the evaluation will be completed by another provider, this initial triage assessment does not replace that evaluation, and the importance of remaining in the ED until their evaluation is complete.     Mike Mail, PA-C 05/04/22 [phone]

## 2022-05-04 NOTE — ED Provider Notes (Signed)
[facility] Provider Note   CSN: [id number] Arrival date & time: 05/04/22  1820     History  Chief Complaint  Patient presents with   Flu Like Symptoms    Mike Harrison is a 62 y.o. male.  Patient presents to the emergency department today for 2 to 3-day history of nasal congestion, left ear pain, sinus pressure, cough and wheezing.  He has a history of asthma.  He states that he has not really needed to use his albuterol inhaler.  No documented fevers.  No nausea or vomiting.  He has a history of HIV.       Home Medications Prior to Admission medications   Medication Sig Start Date End Date Taking? Authorizing Provider  amoxicillin-clavulanate (AUGMENTIN) 875-125 MG tablet Take 1 tablet by mouth every 12 (twelve) hours. 05/04/22  Yes Carlisle Cater, PA-C  benzonatate (TESSALON) 100 MG capsule Take 1 capsule (100 mg total) by mouth every 8 (eight) hours. 05/04/22  Yes Carlisle Cater, PA-C  albuterol (VENTOLIN HFA) 108 (90 Base) MCG/ACT inhaler Inhale 2 puffs into the lungs every 2 (two) hours as needed for wheezing or shortness of breath (cough). 12/31/20   Truman Hayward, MD  bictegravir-emtricitabine-tenofovir AF (BIKTARVY) 50-200-25 MG TABS tablet Take 1 tablet by mouth daily. 11/10/21   Truman Hayward, MD  diclofenac (VOLTAREN) 75 MG EC tablet Take 1 tablet (75 mg total) by mouth 2 (two) times daily. 02/12/21   Truman Hayward, MD  mirtazapine (REMERON) 15 MG tablet Take 1 tablet (15 mg total) by mouth at bedtime. 04/18/22   Truman Hayward, MD  Pitavastatin Magnesium 4 MG TABS Take 1 tablet by mouth daily. 11/10/21   Truman Hayward, MD      Allergies    Hydrochlorothiazide, Lisinopril, and Bactrim [sulfamethoxazole-trimethoprim]    Review of Systems   Review of Systems  Physical Exam Updated Vital Signs BP (!) 138/90 (BP Location: Right Arm)   Pulse 96   Temp 98.1 F (36.7 C) (Oral)   Resp 17   SpO2  96%   Physical Exam Vitals and nursing note reviewed.  Constitutional:      Appearance: He is well-developed.  HENT:     Head: Normocephalic and atraumatic.     Jaw: No trismus.     Right Ear: Tympanic membrane, ear canal and external ear normal.     Left Ear: Ear canal and external ear normal. Tympanic membrane is erythematous and bulging.     Nose: Congestion present. No mucosal edema or rhinorrhea.     Mouth/Throat:     Mouth: Mucous membranes are not dry.     Pharynx: Uvula midline. No oropharyngeal exudate, posterior oropharyngeal erythema or uvula swelling.     Tonsils: No tonsillar abscesses.  Eyes:     General:        Right eye: No discharge.        Left eye: No discharge.     Conjunctiva/sclera: Conjunctivae normal.  Cardiovascular:     Rate and Rhythm: Normal rate and regular rhythm.     Heart sounds: Normal heart sounds.  Pulmonary:     Effort: Pulmonary effort is normal. No respiratory distress.     Breath sounds: Normal breath sounds. No wheezing or rales.     Comments: Lungs clear no wheezing. Abdominal:     Palpations: Abdomen is soft.     Tenderness: There is no abdominal tenderness.  Musculoskeletal:     Cervical back: Normal range of motion and neck supple.  Skin:    General: Skin is warm and dry.  Neurological:     Mental Status: He is alert.     ED Results / Procedures / Treatments   Labs (all labs ordered are listed, but only abnormal results are displayed) Labs Reviewed  RESP PANEL BY RT-PCR (RSV, FLU A&B, COVID)  [id number]    EKG None  Radiology DG Hip Unilat With Pelvis 2-3 Views Right  Result Date: 05/04/2022 CLINICAL DATA:  Sudden onset bilateral groin pain EXAM: DG HIP (WITH OR WITHOUT PELVIS) 3V LEFT; DG HIP (WITH OR WITHOUT PELVIS) 3V RIGHT COMPARISON:  None Available. FINDINGS: There is no evidence of hip fracture or dislocation. There is no evidence of arthropathy or other focal bone abnormality. IMPRESSION: No acute fracture or  dislocation. Electronically Signed   By: Darrin Nipper M.D.   On: 05/04/2022 12:10   DG Hip Unilat With Pelvis 2-3 Views Left  Result Date: 05/04/2022 CLINICAL DATA:  Sudden onset bilateral groin pain EXAM: DG HIP (WITH OR WITHOUT PELVIS) 3V LEFT; DG HIP (WITH OR WITHOUT PELVIS) 3V RIGHT COMPARISON:  None Available. FINDINGS: There is no evidence of hip fracture or dislocation. There is no evidence of arthropathy or other focal bone abnormality. IMPRESSION: No acute fracture or dislocation. Electronically Signed   By: Darrin Nipper M.D.   On: 05/04/2022 12:10    Procedures Procedures    Medications Ordered in ED Medications - No data to display  ED Course/ Medical Decision Making/ A&P    Patient seen and examined. History obtained directly from patient. Work-up including labs, imaging, EKG ordered in triage, if performed, were reviewed.    Labs/EKG: Independently reviewed and interpreted.  This included: Respiratory panel negative  Imaging: None ordered  Medications/Fluids: None ordered  Most recent vital signs reviewed and are as follows: BP (!) 138/90 (BP Location: Right Arm)   Pulse 96   Temp 98.1 F (36.7 C) (Oral)   Resp 17   SpO2 96%   Initial impression: Upper respiratory tract infection, negative viral panel, does have concerning signs for left-sided otitis media.  Plan: Discharge to home.   Prescriptions written for: Augmentin.  He was given a prescription for Voltaren earlier today.  Other home care instructions discussed: OTC meds, rest, good hydration  ED return instructions discussed: Worsening shortness of breath, trouble breathing, increased work of breathing  Follow-up instructions discussed: Patient encouraged to follow-up with their PCP in 5 days if not feeling better.                             Medical Decision Making Risk Prescription drug management.   Patient with symptoms consistent with a viral syndrome. However concern for left-sided otitis media  based on exam.  Treatment plan as above.  Vitals are stable, no fever. No signs of dehydration. Lung exam normal, no signs of pneumonia. Supportive therapy indicated with return if symptoms worsen.  Do not suspect sepsis at this time.  No sign of acute significant asthma exacerbation.         Final Clinical Impression(s) / ED Diagnoses Final diagnoses:  Upper respiratory tract infection, unspecified type  Non-recurrent acute suppurative otitis media of left ear without spontaneous rupture of tympanic membrane    Rx / DC Orders ED Discharge Orders          Ordered  amoxicillin-clavulanate (AUGMENTIN) 875-125 MG tablet  Every 12 hours        05/04/22 2152    benzonatate (TESSALON) 100 MG capsule  Every 8 hours        05/04/22 2152              Carlisle Cater, PA-C 05/04/22 2206    Wynona Dove A, DO 05/06/22 0045

## 2022-05-04 NOTE — ED Triage Notes (Signed)
Pt is here for groin pain x few days

## 2022-05-04 NOTE — Discharge Instructions (Signed)
Please read and follow all provided instructions.  Your diagnoses today include:  1. Upper respiratory tract infection, unspecified type   2. Non-recurrent acute suppurative otitis media of left ear without spontaneous rupture of tympanic membrane     You appear to have an upper respiratory infection (URI). An upper respiratory tract infection, or cold, is a viral infection of the air passages leading to the lungs. It should improve gradually after 5-7 days. You may have a lingering cough that lasts for 2- 4 weeks after the infection.  Tests performed today include: Vital signs. See below for your results today.  Negative flu, COVID and RSV testing  Medications prescribed:  Augmentin - antibiotic  You have been prescribed an antibiotic medicine: take the entire course of medicine even if you are feeling better. Stopping early can cause the antibiotic not to work.  Tessalon Perles - cough suppressant medication  Take any prescribed medications only as directed. Treatment for your infection is aimed at treating the symptoms. There are no medications, such as antibiotics, that will cure your infection.   Home care instructions:  You can take Tylenol and/or Ibuprofen as directed on the packaging for fever reduction and pain relief.    For cough: honey 1/2 to 1 teaspoon (you can dilute the honey in water or another fluid).  You can also use guaifenesin and dextromethorphan for cough. You can use a humidifier for chest congestion and cough.  If you don't have a humidifier, you can sit in the bathroom with the hot shower running.      For sore throat: try warm salt water gargles, cepacol lozenges, throat spray, warm tea or water with lemon/honey, popsicles or ice, or OTC cold relief medicine for throat discomfort.    For congestion: take a daily anti-histamine like Zyrtec, Claritin, and a oral decongestant, such as pseudoephedrine.  You can also use Flonase 1-2 sprays in each nostril daily.     It is important to stay hydrated: drink plenty of fluids (water, gatorade/powerade/pedialyte, juices, or teas) to keep your throat moisturized and help further relieve irritation/discomfort.   Your illness is contagious and can be spread to others, especially during the first 3 or 4 days. It cannot be cured by antibiotics or other medicines. Take basic precautions such as washing your hands often, covering your mouth when you cough or sneeze, and avoiding public places where you could spread your illness to others.   Please continue drinking plenty of fluids.  Use over-the-counter medicines as needed as directed on packaging for symptom relief.  You may also use ibuprofen or tylenol as directed on packaging for pain or fever.  Do not take multiple medicines containing Tylenol or acetaminophen to avoid taking too much of this medication.  Follow-up instructions: Please follow-up with your primary care provider in the next 3 days for further evaluation of your symptoms if you are not feeling better.   Return instructions:  Please return to the Emergency Department if you experience worsening symptoms.  RETURN IMMEDIATELY IF you develop shortness of breath, confusion or altered mental status, a new rash, become dizzy, faint, or poorly responsive, or are unable to be cared for at home. Please return if you have persistent vomiting and cannot keep down fluids or develop a fever that is not controlled by tylenol or motrin.   Please return if you have any other emergent concerns.  Additional Information:  Your vital signs today were: BP (!) 138/90 (BP Location: Right Arm)  Pulse 96   Temp 98.1 F (36.7 C) (Oral)   Resp 17   SpO2 96%  If your blood pressure (BP) was elevated above 135/85 this visit, please have this repeated by your doctor within one month. --------------

## 2022-05-04 NOTE — ED Provider Notes (Signed)
[place]   [id number] 05/04/22 Arrival Time: 2025  ASSESSMENT & PLAN:  X-rays negative for fracture.  Generally well-preserved joint space.  Small pincer lesions bilaterally per my read.  1. Acute hip pain, bilateral    -Etiology of symptoms is difficult to ascertain.  His history and exam certainly point towards more of a intra-articular hip pathology.  His x-rays show well-preserved joint spaces.  There are small pincer lesions but I am unsure if this would adequately explain his acute sharp pain bilaterally.  I will treat this conservatively now with by advised him to restart his prescription medication of diclofenac 75 mg twice daily.  Relative rest and ice were recommended as well.  Also recommended Tylenol for breakthrough pain.  I will have him follow-up with Dr. Micheline Chapman or Dr. Percell Miller in coming weeks if this does not improve.  All questions were answered and he agrees to the plan.  No orders of the defined types were placed in this encounter.  Discharge Instructions   None     Follow-up Information     Renette Butters, MD.   Specialty: Orthopedic Surgery Why: If symptoms worsen Contact information: [address] [place] [address] [phone]                  Reviewed expectations re: course of current medical issues. Questions answered. Outlined signs and symptoms indicating need for more acute intervention. Patient verbalized understanding. After Visit Summary given.   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  Started about 2 to 3 days ago.  He denies any inciting injury or trauma.  Denies any heavy lifting.  Says the pain is sharp and shooting in is located in both groins on the anterior part of the hip.  He does also report some associated lateral hip pain bilaterally as well.  He denies any numbness or  tingling down the leg.  He used to take diclofenac for his arthritis but has not taken that in a while.  Denies any fevers, suprapubic pain, dysuria, or skin rashes.  No LMP for male patient. Past Surgical History:  Procedure Laterality Date   HERNIA REPAIR       OBJECTIVE:  Vitals:   05/04/22 1128  BP: (!) 136/94  Pulse: 91  Resp: 16  Temp: 98.1 F (36.7 C)  TempSrc: Oral  SpO2: 97%     Physical Exam Vitals reviewed.  Cardiovascular:     Rate and Rhythm: Normal rate.  Pulmonary:     Effort: Pulmonary effort is normal.  Abdominal:     General: Abdomen is flat.     Palpations: Abdomen is soft.     Tenderness: There is no abdominal tenderness.  Musculoskeletal:     Comments: R Hip -no obvious deformity.  Mildly tender to palpation at the anterior groin.  Tender to palpation at greater trochanter.  Full range of motion in hip flexion.  4/5 strength with resisted hip flexion.   Positive logroll test.  Positive Stinchfield test.  Positive FADIR and FABER tests.  L Hip -no obvious deformity.  Mildly tender to palpation at the anterior groin.  Tender to palpation at greater trochanter.  Full range of motion in hip flexion.  4/5 strength with resisted hip flexion.   Positive logroll test.  Positive Stinchfield test.  Positive FADIR and FABER tests.  Neurological:  Mental Status: He is alert.      Labs: Results for orders placed or performed during the hospital encounter of 04/14/22  CBC with Differential  Result Value Ref Range   WBC 6.4 4.0 - 10.5 K/uL   RBC 4.50 4.22 - 5.81 MIL/uL   Hemoglobin 14.5 13.0 - 17.0 g/dL   HCT 41.0 39.0 - 52.0 %   MCV 91.1 80.0 - 100.0 fL   MCH 32.2 26.0 - 34.0 pg   MCHC 35.4 30.0 - 36.0 g/dL   RDW 12.0 11.5 - 15.5 %   Platelets 321 150 - 400 K/uL   nRBC 0.0 0.0 - 0.2 %   Neutrophils Relative % 58 %   Neutro Abs 3.7 1.7 - 7.7 K/uL   Lymphocytes Relative 28 %   Lymphs Abs 1.8 0.7 - 4.0 K/uL   Monocytes Relative 9 %   Monocytes  Absolute 0.6 0.1 - 1.0 K/uL   Eosinophils Relative 4 %   Eosinophils Absolute 0.3 0.0 - 0.5 K/uL   Basophils Relative 1 %   Basophils Absolute 0.1 0.0 - 0.1 K/uL   Immature Granulocytes 0 %   Abs Immature Granulocytes 0.02 0.00 - 0.07 K/uL  Basic metabolic panel  Result Value Ref Range   Sodium 137 135 - 145 mmol/L   Potassium 3.5 3.5 - 5.1 mmol/L   Chloride 105 98 - 111 mmol/L   CO2 24 22 - 32 mmol/L   Glucose, Bld 105 (H) 70 - 99 mg/dL   BUN 8 6 - 20 mg/dL   Creatinine, Ser 1.09 0.61 - 1.24 mg/dL   Calcium 9.1 8.9 - 10.3 mg/dL   GFR, Estimated >60 >60 mL/min   Anion gap 8 5 - 15   Labs Reviewed - No data to display  Imaging: DG Hip Unilat With Pelvis 2-3 Views Right  Result Date: 05/04/2022 CLINICAL DATA:  Sudden onset bilateral groin pain EXAM: DG HIP (WITH OR WITHOUT PELVIS) 3V LEFT; DG HIP (WITH OR WITHOUT PELVIS) 3V RIGHT COMPARISON:  None Available. FINDINGS: There is no evidence of hip fracture or dislocation. There is no evidence of arthropathy or other focal bone abnormality. IMPRESSION: No acute fracture or dislocation. Electronically Signed   By: Darrin Nipper M.D.   On: 05/04/2022 12:10   DG Hip Unilat With Pelvis 2-3 Views Left  Result Date: 05/04/2022 CLINICAL DATA:  Sudden onset bilateral groin pain EXAM: DG HIP (WITH OR WITHOUT PELVIS) 3V LEFT; DG HIP (WITH OR WITHOUT PELVIS) 3V RIGHT COMPARISON:  None Available. FINDINGS: There is no evidence of hip fracture or dislocation. There is no evidence of arthropathy or other focal bone abnormality. IMPRESSION: No acute fracture or dislocation. Electronically Signed   By: Darrin Nipper M.D.   On: 05/04/2022 12:10     Allergies  Allergen Reactions   Hydrochlorothiazide Other (See Comments)    Scrotal edema   Lisinopril Other (See Comments)    Dry cough   Bactrim [Sulfamethoxazole-Trimethoprim]                                                Past Medical History:  Diagnosis Date   Acute medial meniscus tear    Allergic  reaction to alpha-gal 02/12/2021   Allergy to alpha-gal 11/10/2021   Anxiety    Arthritis    Arthritis    Asthma    Depression  Hyperlipidemia 12/31/2020   Hypertension 12/31/2020   Onychomycosis    Pruritus    Smoker    Toe pain     Social History   Socioeconomic History   Marital status: Widowed    Spouse name: Not on file   Number of children: Not on file   Years of education: Not on file   Highest education level: Not on file  Occupational History   Not on file  Tobacco Use   Smoking status: Some Days    Packs/day: 0.50    Types: Cigarettes   Smokeless tobacco: Never   Tobacco comments:    Changed to Black and Mild and slowed down smoking. 11/10/21  Vaping Use   Vaping Use: Never used  Substance and Sexual Activity   Alcohol use: Never   Drug use: Never   Sexual activity: Not on file  Other Topics Concern   Not on file  Social History Narrative   Not on file   Social Determinants of Health   Financial Resource Strain: Not on file  Food Insecurity: Not on file  Transportation Needs: Not on file  Physical Activity: Not on file  Stress: Not on file  Social Connections: Not on file  Intimate Partner Violence: Not on file    Family History  Problem Relation Age of Onset   Cancer Mother    Hypertension Mother    Cirrhosis Father       Densel Kronick, Dorian Pod, MD 05/04/22 1331

## 2022-05-04 NOTE — ED Triage Notes (Signed)
Pt arrives with c/o cough, congestion, and sneezing that started a couple of days ago. Pt denies fever, CP, or SOB.

## 2022-05-05 ENCOUNTER — Other Ambulatory Visit (HOSPITAL_COMMUNITY)
Admission: EM | Admit: 2022-05-05 | Discharge: 2022-05-10 | Disposition: A | Discharge status: Home / Self Care | Payer: Medicare HMO | Attending: Psychiatry | Admitting: Psychiatry

## 2022-05-05 DIAGNOSIS — Z79899 Other long term (current) drug therapy: Secondary | ICD-10-CM | POA: Insufficient documentation

## 2022-05-05 DIAGNOSIS — R69 Illness, unspecified: Secondary | ICD-10-CM | POA: Diagnosis not present

## 2022-05-05 DIAGNOSIS — Z716 Tobacco abuse counseling: Secondary | ICD-10-CM | POA: Diagnosis not present

## 2022-05-05 DIAGNOSIS — F332 Major depressive disorder, recurrent severe without psychotic features: Secondary | ICD-10-CM

## 2022-05-05 DIAGNOSIS — F1414 Cocaine abuse with cocaine-induced mood disorder: Secondary | ICD-10-CM | POA: Diagnosis present

## 2022-05-05 DIAGNOSIS — F1994 Other psychoactive substance use, unspecified with psychoactive substance-induced mood disorder: Secondary | ICD-10-CM

## 2022-05-05 DIAGNOSIS — F17210 Nicotine dependence, cigarettes, uncomplicated: Secondary | ICD-10-CM | POA: Diagnosis not present

## 2022-05-05 DIAGNOSIS — Z5900 Homelessness unspecified: Secondary | ICD-10-CM | POA: Diagnosis not present

## 2022-05-05 DIAGNOSIS — B20 Human immunodeficiency virus [HIV] disease: Secondary | ICD-10-CM

## 2022-05-05 DIAGNOSIS — Z21 Asymptomatic human immunodeficiency virus [HIV] infection status: Secondary | ICD-10-CM | POA: Diagnosis not present

## 2022-05-05 DIAGNOSIS — Z1152 Encounter for screening for COVID-19: Secondary | ICD-10-CM | POA: Insufficient documentation

## 2022-05-05 DIAGNOSIS — F1590 Other stimulant use, unspecified, uncomplicated: Secondary | ICD-10-CM | POA: Insufficient documentation

## 2022-05-05 DIAGNOSIS — F1410 Cocaine abuse, uncomplicated: Secondary | ICD-10-CM | POA: Diagnosis present

## 2022-05-05 DIAGNOSIS — F1290 Cannabis use, unspecified, uncomplicated: Secondary | ICD-10-CM | POA: Insufficient documentation

## 2022-05-05 LAB — CBC WITH DIFFERENTIAL/PLATELET
Abs Immature Granulocytes: 0.06 10*3/uL (ref 0.00–0.07)
Basophils Absolute: 0.1 10*3/uL (ref 0.0–0.1)
Basophils Relative: 1 %
Eosinophils Absolute: 0.2 10*3/uL (ref 0.0–0.5)
Eosinophils Relative: 1 %
HCT: 43.5 % (ref 39.0–52.0)
Hemoglobin: 14.9 g/dL (ref 13.0–17.0)
Immature Granulocytes: 0 %
Lymphocytes Relative: 14 %
Lymphs Abs: 1.9 10*3/uL (ref 0.7–4.0)
MCH: 32.7 pg (ref 26.0–34.0)
MCHC: 34.3 g/dL (ref 30.0–36.0)
MCV: 95.6 fL (ref 80.0–100.0)
Monocytes Absolute: 1 10*3/uL (ref 0.1–1.0)
Monocytes Relative: 7 %
Neutro Abs: 10.6 10*3/uL — ABNORMAL HIGH (ref 1.7–7.7)
Neutrophils Relative %: 77 %
Platelets: 265 10*3/uL (ref 150–400)
RBC: 4.55 MIL/uL (ref 4.22–5.81)
RDW: 13.1 % (ref 11.5–15.5)
WBC: 13.8 10*3/uL — ABNORMAL HIGH (ref 4.0–10.5)
nRBC: 0 % (ref 0.0–0.2)

## 2022-05-05 LAB — HEMOGLOBIN A1C
Hgb A1c MFr Bld: 5.2 % (ref 4.8–5.6)
Mean Plasma Glucose: 102.54 mg/dL

## 2022-05-05 LAB — TSH: TSH: 0.665 u[IU]/mL (ref 0.350–4.500)

## 2022-05-05 LAB — COMPREHENSIVE METABOLIC PANEL
ALT: 21 U/L (ref 0–44)
AST: 20 U/L (ref 15–41)
Albumin: 3.6 g/dL (ref 3.5–5.0)
Alkaline Phosphatase: 61 U/L (ref 38–126)
Anion gap: 9 (ref 5–15)
BUN: 11 mg/dL (ref 6–20)
CO2: 28 mmol/L (ref 22–32)
Calcium: 9.1 mg/dL (ref 8.9–10.3)
Chloride: 102 mmol/L (ref 98–111)
Creatinine, Ser: 1.03 mg/dL (ref 0.61–1.24)
GFR, Estimated: >60 mL/min (ref >60)
Glucose, Bld: 102 mg/dL — ABNORMAL HIGH (ref 70–99)
Potassium: 4 mmol/L (ref 3.5–5.1)
Sodium: 139 mmol/L (ref 135–145)
Total Bilirubin: 0.7 mg/dL (ref 0.3–1.2)
Total Protein: 6.5 g/dL (ref 6.5–8.1)

## 2022-05-05 LAB — MAGNESIUM: Magnesium: 2.3 mg/dL (ref 1.7–2.4)

## 2022-05-05 LAB — POCT URINE DRUG SCREEN - MANUAL ENTRY (I-SCREEN)
POC Amphetamine UR: NOT DETECTED
POC Buprenorphine (BUP): NOT DETECTED
POC Cocaine UR: NOT DETECTED
POC Marijuana UR: POSITIVE — AB
POC Methadone UR: NOT DETECTED
POC Methamphetamine UR: NOT DETECTED
POC Morphine: NOT DETECTED
POC Oxazepam (BZO): NOT DETECTED
POC Oxycodone UR: NOT DETECTED
POC Secobarbital (BAR): NOT DETECTED

## 2022-05-05 LAB — RESP PANEL BY RT-PCR (RSV, FLU A&B, COVID)  RVPGX2
Influenza A by PCR: NEGATIVE
Influenza B by PCR: NEGATIVE
Resp Syncytial Virus by PCR: NEGATIVE
SARS Coronavirus 2 by RT PCR: NEGATIVE

## 2022-05-05 LAB — LIPID PANEL
Cholesterol: 125 mg/dL (ref 0–200)
HDL: 49 mg/dL (ref >40)
LDL Cholesterol: 46 mg/dL (ref 0–99)
Total CHOL/HDL Ratio: 2.6 RATIO
Triglycerides: 150 mg/dL — ABNORMAL HIGH (ref <150)
VLDL: 30 mg/dL (ref 0–40)

## 2022-05-05 LAB — POC SARS CORONAVIRUS 2 AG: SARSCOV2ONAVIRUS 2 AG: NEGATIVE

## 2022-05-05 LAB — ETHANOL: Alcohol, Ethyl (B): <10 mg/dL (ref <10)

## 2022-05-05 MED ORDER — ALUM & MAG HYDROXIDE-SIMETH 200-200-20 MG/5ML PO SUSP: 30.0000 mL | ORAL | Status: DC | PRN

## 2022-05-05 MED ORDER — AMOXICILLIN-POT CLAVULANATE 875-125 MG PO TABS
1.0000 | ORAL_TABLET | Freq: Two times a day (BID) | ORAL | Status: DC
  Administered 2022-05-05 – 2022-05-10 (×10): 1 via ORAL
  Filled 2022-05-05 (×10): qty 1

## 2022-05-05 MED ORDER — ACETAMINOPHEN 325 MG PO TABS: 650.0000 mg | ORAL_TABLET | Freq: Four times a day (QID) | ORAL | Status: DC | PRN

## 2022-05-05 MED ORDER — MAGNESIUM HYDROXIDE 400 MG/5ML PO SUSP: 30.0000 mL | Freq: Every day | ORAL | Status: DC | PRN

## 2022-05-05 MED ORDER — PITAVASTATIN MAGNESIUM 4 MG PO TABS: 4.0000 mg | ORAL_TABLET | Freq: Every day | ORAL | Status: DC

## 2022-05-05 MED ORDER — ATORVASTATIN CALCIUM 10 MG PO TABS
20.0000 mg | ORAL_TABLET | Freq: Every day | ORAL | Status: DC
  Administered 2022-05-06 – 2022-05-10 (×5): 20 mg via ORAL
  Filled 2022-05-05 (×5): qty 2

## 2022-05-05 MED ORDER — HYDROXYZINE HCL 25 MG PO TABS
25.0000 mg | ORAL_TABLET | Freq: Three times a day (TID) | ORAL | Status: DC | PRN
  Administered 2022-05-05: 25 mg via ORAL
  Filled 2022-05-05: qty 1

## 2022-05-05 MED ORDER — MIRTAZAPINE 15 MG PO TABS
15.0000 mg | ORAL_TABLET | Freq: Every day | ORAL | Status: DC
  Administered 2022-05-05: 15 mg via ORAL
  Filled 2022-05-05: qty 1

## 2022-05-05 MED ORDER — ALBUTEROL SULFATE HFA 108 (90 BASE) MCG/ACT IN AERS: 2.0000 | INHALATION_SPRAY | RESPIRATORY_TRACT | Status: DC | PRN

## 2022-05-05 MED ORDER — BICTEGRAVIR-EMTRICITAB-TENOFOV 50-200-25 MG PO TABS
1.0000 | ORAL_TABLET | Freq: Every day | ORAL | Status: DC
  Administered 2022-05-06 – 2022-05-10 (×5): 1 via ORAL
  Filled 2022-05-05 (×5): qty 1

## 2022-05-05 MED ORDER — DICLOFENAC SODIUM 75 MG PO TBEC
75.0000 mg | DELAYED_RELEASE_TABLET | Freq: Two times a day (BID) | ORAL | Status: DC
  Administered 2022-05-05 – 2022-05-10 (×7): 75 mg via ORAL
  Filled 2022-05-05 (×6): qty 1

## 2022-05-05 MED ORDER — TRAZODONE HCL 50 MG PO TABS
50.0000 mg | ORAL_TABLET | Freq: Every evening | ORAL | Status: DC | PRN
  Filled 2022-05-05 (×2): qty 1

## 2022-05-05 MED ORDER — BENZONATATE 100 MG PO CAPS
100.0000 mg | ORAL_CAPSULE | Freq: Two times a day (BID) | ORAL | Status: DC | PRN
  Administered 2022-05-05: 100 mg via ORAL
  Filled 2022-05-05: qty 1

## 2022-05-05 NOTE — ED Provider Notes (Signed)
Facility Based Crisis Admission H&P  Date: 05/05/22 Patient Name: Mike Harrison MRN: [id number] Chief Complaint: Cocaine Use Disorder  Diagnoses:  Final diagnoses:  Cocaine use disorder [facility])    HPI: Patient presents voluntarily to [facility] behavioral health for walk-in assessment.  Patient is assessed, face-to-face, by nurse practitioner. He is seated in assessment area, no acute distress. Consulted with provider, Dr.  Dwyane Dee, and chart reviewed on 05/05/2022. He  is alert and oriented, pleasant and cooperative during assessment.  Mike Harrison states "I relapsed on cocaine 3 months ago, I have a 51-monthold baby and I am ready for residential treatment."  Patient endorses cocaine use, several times per week for the past 3 months.  Most recent cocaine use 4 days ago.  Prior to relapse 3 months ago patient clean and sober since 22020-04-03  [facility] [place] a 190-mont[place]ay.  Prior to 2004-03-2020atient reports 11-year sobriety.  Patient also endorses rare methamphetamine use.  Most recent methamphetamine use 1 week ago, uses methamphetamine less than 1 time per month.  He also endorses intermittent marijuana use.  Most recent marijuana use 4 days ago.  Typically uses marijuana several times per week.  He denies alcohol use, denies substance use aside from cocaine, methamphetamine and marijuana.  Patient also uses nicotine/tobacco cigarettes, declines nicotine replacement at this time.  Recent stressors include an argument with his son's mother on Mo03-Apr-2024ausing patient to be unable to return to shared home with child's mother.  Chronic stressors include the death of patient's wife in 2004-03-2020s well as his mother in 20April 03, 2020 [place] not linked with outpatient psychiatry currently.  He is prescribed mirtazapine by primary care provider, 15 mg nightly.  He is compliant with medication.  He denies history of inpatient psychiatric treatment.  No family  mental health history reported.  Patient  presents with euthymic mood, congruent affect. He  denies suicidal and homicidal ideations. Denies history of suicide attempts, denies history of non suicidal self-harm.  Patient easily  contracts verbally for safety with this wrProbation officer   Patient has normal speech and behavior.  He  denies auditory and visual hallucinations.  Patient is able to converse coherently with goal-directed thoughts and no distractibility or preoccupation.  Denies symptoms of paranoia.  Objectively there is no evidence of psychosis/mania or delusional thinking.  [place]. He has requested leave of absence to seek substance use treatment, employer aware. Patient endorses average sleep and appetite.  Patient offered support and encouragement.  He remains voluntary.  Reviewed treatment plan to include admission to facility based crisis unit with plan to transition to residential substance use treatment.  Discussed restarting home medications, reviewed patient's preference to remain full CODE STATUS.  He verbalized understanding and agreement with plan.   PHQ 2-9:  FlHarrisonD [facility]20[facility] that you would be better off dead, or of hurting yourself in some way Not at all  PHQ-9 Total Score 22       Fl[place]D [facility]20[facility] recent reading at 05/05/2022  5:41 PM ED [facility] recent reading at 05/04/2022  6:44 PM [facility] recent reading at 05/04/2022 11:27 AM  C-SSRS RISK CATEGORY No Risk No  Risk No Risk        Total Time spent with patient: 30 minutes  Musculoskeletal  Strength & Muscle Tone: within normal limits Gait & Station: normal Patient leans: N/A  Psychiatric Specialty Exam   Presentation General Appearance:  Appropriate for Environment; Casual  Eye Contact: Good  Speech: Clear and Coherent; Normal Rate  Speech Volume: Normal  Handedness: Right   Mood and Affect  Mood: Euthymic  Affect: Appropriate; Congruent   Thought Process  Thought Processes: Coherent; Goal Directed; Linear  Descriptions of Associations:Intact  Orientation:Full (Time, Place and Person)  Thought Content:Logical; WDL    Hallucinations:Hallucinations: None  Ideas of Reference:None  Suicidal Thoughts:Suicidal Thoughts: No  Homicidal Thoughts:Homicidal Thoughts: No   Sensorium  Memory: Immediate Good; Recent Good  Judgment: Good  Insight: Good   Executive Functions  Concentration: Good  Attention Span: Good  Recall: Good  Fund of Knowledge: Good  Language: Good   Psychomotor Activity  Psychomotor Activity: Psychomotor Activity: Normal   Assets  Assets: Communication Skills; Desire for Improvement; Leisure Time; Physical Health; Resilience; Social Support   Sleep  Sleep: Sleep: Fair   Nutritional Assessment (For OBS and FBC admissions only) Has the patient had a weight loss or gain of 10 pounds or more in the last 3 months?: No Has the patient had a decrease in food intake/or appetite?: No Does the patient have dental problems?: No Does the patient have eating habits or behaviors that may be indicators of an eating disorder including binging or inducing vomiting?: No Has the patient recently lost weight without trying?: 0 Has the patient been eating poorly because of a decreased appetite?: 0 Malnutrition Screening Tool Score: 0    Physical Exam Vitals and nursing note reviewed.  Constitutional:      Appearance: Normal appearance. He is well-developed and normal weight.  HENT:     Head: Normocephalic and atraumatic.     Nose: Nose normal.  Cardiovascular:     Rate and Rhythm: Normal rate.  Pulmonary:     Effort:  Pulmonary effort is normal.  Musculoskeletal:        General: Normal range of motion.     Cervical back: Normal range of motion.  Skin:    General: Skin is warm and dry.  Neurological:     Mental Status: He is alert and oriented to person, place, and time.  Psychiatric:        Attention and Perception: Attention and perception normal.        Mood and Affect: Mood and affect normal.        Speech: Speech normal.        Behavior: Behavior normal. Behavior is cooperative.        Thought Content: Thought content normal.        Cognition and Memory: Cognition and memory normal.        Judgment: Judgment normal.    Review of Systems  Constitutional: Negative.   HENT: Negative.    Eyes: Negative.   Respiratory: Negative.    Cardiovascular: Negative.   Gastrointestinal: Negative.   Genitourinary: Negative.   Musculoskeletal: Negative.   Skin: Negative.   Neurological: Negative.   Psychiatric/Behavioral:  Positive for substance abuse.     Blood pressure (!) 179/91, pulse 86, resp. rate 18, SpO2 100 %. There is no height or weight on file to calculate BMI.  Past Psychiatric History: Cocaine use disorder  Is the patient at risk to self? No  Has the patient been  a risk to self in the past 6 months? No .    Has the patient been a risk to self within the distant past? No   Is the patient a risk to others? No   Has the patient been a risk to others in the past 6 months? No   Has the patient been a risk to others within the distant past? No   Past Medical History: HIV, Hypertension, hyperlipidemia, arthritis, asthma, pruritus Family History: None reported Social History: Currently homeless, substance use disorder  Last Labs:  Admission on 05/04/2022, Discharged on 05/04/2022  Component Date Value Ref Range Status   SARS Coronavirus 2 by RT PCR 05/04/2022 NEGATIVE  NEGATIVE Final   Influenza A by PCR 05/04/2022 NEGATIVE  NEGATIVE Final   Influenza B by PCR 05/04/2022 NEGATIVE   NEGATIVE Final   Comment: (NOTE) The Xpert Xpress SARS-CoV-2/FLU/RSV plus assay is intended as an aid in the diagnosis of influenza from Nasopharyngeal swab specimens and should not be used as a sole basis for treatment. Nasal washings and aspirates are unacceptable for Xpert Xpress SARS-CoV-2/FLU/RSV testing.  Fact Sheet for Patients: EntrepreneurPulse.com.au  Fact Sheet for Healthcare Providers: IncredibleEmployment.be  This test is not yet approved or cleared by the [place] FDA and has been authorized for detection and/or diagnosis of SARS-CoV-2 by FDA under an Emergency Use Authorization (EUA). This EUA will remain in effect (meaning this test can be used) for the duration of the COVID-19 declaration under Section 564(b)(1) of the Act, 21 U.S.C. section 360bbb-3(b)(1), unless the authorization is terminated or revoked.     Resp Syncytial Virus by PCR 05/04/2022 NEGATIVE  NEGATIVE Final   Comment: (NOTE) Fact Sheet for Patients: EntrepreneurPulse.com.au  Fact Sheet for Healthcare Providers: IncredibleEmployment.be  This test is not yet approved or cleared by the [place] FDA and has been authorized for detection and/or diagnosis of SARS-CoV-2 by FDA under an Emergency Use Authorization (EUA). This EUA will remain in effect (meaning this test can be used) for the duration of the COVID-19 declaration under Section 564(b)(1) of the Act, 21 U.S.C. section 360bbb-3(b)(1), unless the authorization is terminated or revoked.  Performed at [facility], [place] [address], [place], [place] [address]   Admission on 04/14/2022, Discharged on 04/14/2022  Component Date Value Ref Range Status   WBC 04/13/2022 6.4  4.0 - 10.5 K/uL Final   RBC 04/13/2022 4.50  4.22 - 5.81 MIL/uL Final   Hemoglobin 04/13/2022 14.5  13.0 - 17.0 g/dL Final   HCT 04/13/2022 41.0  39.0 - 52.0 % Final   MCV 04/13/2022  91.1  80.0 - 100.0 fL Final   MCH 04/13/2022 32.2  26.0 - 34.0 pg Final   MCHC 04/13/2022 35.4  30.0 - 36.0 g/dL Final   RDW 04/13/2022 12.0  11.5 - 15.5 % Final   Platelets 04/13/2022 321  150 - 400 K/uL Final   nRBC 04/13/2022 0.0  0.0 - 0.2 % Final   Neutrophils Relative % 04/13/2022 58  % Final   Neutro Abs 04/13/2022 3.7  1.7 - 7.7 K/uL Final   Lymphocytes Relative 04/13/2022 28  % Final   Lymphs Abs 04/13/2022 1.8  0.7 - 4.0 K/uL Final   Monocytes Relative 04/13/2022 9  % Final   Monocytes Absolute 04/13/2022 0.6  0.1 - 1.0 K/uL Final   Eosinophils Relative 04/13/2022 4  % Final   Eosinophils Absolute 04/13/2022 0.3  0.0 - 0.5 K/uL Final   Basophils Relative 04/13/2022 1  %  Final   Basophils Absolute 04/13/2022 0.1  0.0 - 0.1 K/uL Final   Immature Granulocytes 04/13/2022 0  % Final   Abs Immature Granulocytes 04/13/2022 0.02  0.00 - 0.07 K/uL Final   Performed at [facility], [address], [place], [place] [address]   Sodium 04/13/2022 137  135 - 145 mmol/L Final   Potassium 04/13/2022 3.5  3.5 - 5.1 mmol/L Final   Chloride 04/13/2022 105  98 - 111 mmol/L Final   CO2 04/13/2022 24  22 - 32 mmol/L Final   Glucose, Bld 04/13/2022 105 (H)  70 - 99 mg/dL Final   Glucose reference range applies only to samples taken after fasting for at least 8 hours.   BUN 04/13/2022 8  6 - 20 mg/dL Final   Creatinine, Ser 04/13/2022 1.09  0.61 - 1.24 mg/dL Final   Calcium 04/13/2022 9.1  8.9 - 10.3 mg/dL Final   GFR, Estimated 04/13/2022 >60  >60 mL/min Final   Comment: (NOTE) Calculated using the CKD-EPI Creatinine Equation (2021)    Anion gap 04/13/2022 8  5 - 15 Final   Performed at [facility], [address] [place], [place] [address]  Office Visit on 11/10/2021  Component Date Value Ref Range Status   HIV 1 RNA Quant 11/10/2021 Not Detected  Copies/mL Final   HIV-1 RNA Quant, Log 11/10/2021 Not Detected  Log cps/mL Final   Comment:  . Reference Range:                           Not Detected     copies/mL                           Not Detected Log copies/mL . Marland Kitchen The test was performed using Real-Time Polymerase Chain Reaction. . . Reportable Range: 20 copies/mL to 10,000,000 copies/mL (1.30 Log copies/mL to 7.00 Log copies/mL). .    RPR Ser Ql 11/10/2021 NON-REACTIVE  NON-REACTIVE Final   Glucose, Bld 11/10/2021 96  65 - 99 mg/dL Final   Comment: .            Fasting reference interval .    BUN 11/10/2021 15  7 - 25 mg/dL Final   Creat 11/10/2021 1.32 (H)  0.70 - 1.30 mg/dL Final   eGFR 11/10/2021 62  > OR = 60 mL/min/1.48m Final   BUN/Creatinine Ratio 11/10/2021 11  6 - 22 (calc) Final   Sodium 11/10/2021 140  135 - 146 mmol/L Final   Potassium 11/10/2021 4.0  3.5 - 5.3 mmol/L Final   Chloride 11/10/2021 107  98 - 110 mmol/L Final   CO2 11/10/2021 27  20 - 32 mmol/L Final   Calcium 11/10/2021 9.5  8.6 - 10.3 mg/dL Final   Total Protein 11/10/2021 7.9  6.1 - 8.1 g/dL Final   Albumin 11/10/2021 4.6  3.6 - 5.1 g/dL Final   Globulin 11/10/2021 3.3  1.9 - 3.7 g/dL (calc) Final   AG Ratio 11/10/2021 1.4  1.0 - 2.5 (calc) Final   Total Bilirubin 11/10/2021 0.8  0.2 - 1.2 mg/dL Final   Alkaline phosphatase (APISO) 11/10/2021 68  35 - 144 U/L Final   AST 11/10/2021 19  10 - 35 U/L Final   ALT 11/10/2021 20  9 - 46 U/L Final   WBC 11/10/2021 7.1  3.8 - 10.8 Thousand/uL Final   RBC 11/10/2021 4.71  4.20 - 5.80 Million/uL Final  Hemoglobin 11/10/2021 15.3  13.2 - 17.1 g/dL Final   HCT 11/10/2021 44.2  38.5 - 50.0 % Final   MCV 11/10/2021 93.8  80.0 - 100.0 fL Final   MCH 11/10/2021 32.5  27.0 - 33.0 pg Final   MCHC 11/10/2021 34.6  32.0 - 36.0 g/dL Final   RDW 11/10/2021 13.0  11.0 - 15.0 % Final   Platelets 11/10/2021 297  140 - 400 Thousand/uL Final   MPV 11/10/2021 10.6  7.5 - 12.5 fL Final   Neutro Abs 11/10/2021 4,367  1,500 - 7,800 cells/uL Final   Lymphs Abs 11/10/2021 1,924  850 - 3,900 cells/uL Final    Absolute Monocytes 11/10/2021 490  200 - 950 cells/uL Final   Eosinophils Absolute 11/10/2021 263  15 - 500 cells/uL Final   Basophils Absolute 11/10/2021 57  0 - 200 cells/uL Final   Neutrophils Relative % 11/10/2021 61.5  % Final   Total Lymphocyte 11/10/2021 27.1  % Final   Monocytes Relative 11/10/2021 6.9  % Final   Eosinophils Relative 11/10/2021 3.7  % Final   Basophils Relative 11/10/2021 0.8  % Final   CD4 T Cell Abs 11/10/2021 274 (L)  400 - 1,790 /uL Final   CD4 % Helper T Cell 11/10/2021 15 (L)  33 - 65 % Final   Performed at [facility], [place] [address] [place], [place] [address]   Cholesterol 11/10/2021 151  <200 mg/dL Final   HDL 11/10/2021 47  > OR = 40 mg/dL Final   Triglycerides 11/10/2021 92  <150 mg/dL Final   LDL Cholesterol (Calc) 11/10/2021 85  mg/dL (calc) Final   Comment: Reference range: <100 . Desirable range <100 mg/dL for primary prevention;   <70 mg/dL for patients with CHD or diabetic patients  with > or = 2 CHD risk factors. Marland Kitchen LDL-C is now calculated using the Martin-Hopkins  calculation, which is a validated novel method providing  better accuracy than the Friedewald equation in the  estimation of LDL-C.  Cresenciano Genre et al. Annamaria Helling. [id number]): 2061-2068  (http://education.[employer].com/faq/FAQ164)    Total CHOL/HDL Ratio 11/10/2021 3.2  <5.0 (calc) Final   Non-HDL Cholesterol (Calc) 11/10/2021 104  <130 mg/dL (calc) Final   Comment: For patients with diabetes plus 1 major ASCVD risk  factor, treating to a non-HDL-C goal of <100 mg/dL  (LDL-C of <70 mg/dL) is considered a therapeutic  option.    Neisseria Gonorrhea 11/10/2021 Negative   Final   Chlamydia 11/10/2021 Negative   Final   Comment 11/10/2021 Normal Reference Ranger Chlamydia - Negative   Final   Comment 11/10/2021 Normal Reference Range Neisseria Gonorrhea - Negative   Final   (tTG) Ab, IgA 11/10/2021 <1.0  U/mL Final   Comment: Value           Interpretation -----          -------------- <15.0          Antibody not detected > or = 15.0    Antibody detected .     Allergies: Hydrochlorothiazide, Lisinopril, and Bactrim [sulfamethoxazole-trimethoprim]  Medications:  Facility Ordered Medications  Medication   acetaminophen (TYLENOL) tablet 650 mg   alum & mag hydroxide-simeth (MAALOX/MYLANTA) 200-200-20 MG/5ML suspension 30 mL   magnesium hydroxide (MILK OF MAGNESIA) suspension 30 mL   hydrOXYzine (ATARAX) tablet 25 mg   traZODone (DESYREL) tablet 50 mg   albuterol (VENTOLIN HFA) 108 (90 Base) MCG/ACT inhaler 2 puff   amoxicillin-clavulanate (AUGMENTIN) 875-125 MG per tablet 1 tablet   [  START ON 05/06/2022] bictegravir-emtricitabine-tenofovir AF (BIKTARVY) 50-200-25 MG per tablet 1 tablet   benzonatate (TESSALON) capsule 100 mg   diclofenac (VOLTAREN) EC tablet 75 mg   mirtazapine (REMERON) tablet 15 mg   [START ON 05/06/2022] Pitavastatin Magnesium TABS 4 mg   PTA Medications  Medication Sig   albuterol (VENTOLIN HFA) 108 (90 Base) MCG/ACT inhaler Inhale 2 puffs into the lungs every 2 (two) hours as needed for wheezing or shortness of breath (cough).   diclofenac (VOLTAREN) 75 MG EC tablet Take 1 tablet (75 mg total) by mouth 2 (two) times daily.   bictegravir-emtricitabine-tenofovir AF (BIKTARVY) 50-200-25 MG TABS tablet Take 1 tablet by mouth daily.   Pitavastatin Magnesium 4 MG TABS Take 1 tablet by mouth daily.   mirtazapine (REMERON) 15 MG tablet Take 1 tablet (15 mg total) by mouth at bedtime.   amoxicillin-clavulanate (AUGMENTIN) 875-125 MG tablet Take 1 tablet by mouth every 12 (twelve) hours.   benzonatate (TESSALON) 100 MG capsule Take 1 capsule (100 mg total) by mouth every 8 (eight) hours.    Long Term Goals: Improvement in symptoms so as ready for discharge  Short Term Goals: Patient will verbalize feelings in meetings with treatment team members., Patient will attend at least of 50% of the groups daily., Pt  will complete the PHQ9 on admission, day 3 and discharge., Patient will participate in completing the [place], Patient will score a low risk of violence for 24 hours prior to discharge, and Patient will take medications as prescribed daily.  Medical Decision Making  Patient remains voluntary.  He will be admitted to [facility] behavioral health facility based crisis.  Patient would like to transition to residential substance use treatment.  Laboratory studies ordered including CBC, CMP, ethanol, A1c, lipid panel, magnesium, prolactin and TSH.  Urine drug screen order initiated.  EKG ordered.  Current medications: -Acetaminophen 650 mg every 6 as needed/mild pain -Maalox 30 mL oral every 4 as needed/digestion -Hydroxyzine 25 mg 3 times daily as needed/anxiety -Magnesium hydroxide 30 mL daily as needed/mild constipation -Trazodone 50 mg nightly as needed/sleep  Restarted home medications including: -Albuterol (Ventolin HFA) 108 mcg 2 puff inhalation every 2 hours as needed/wheezing or shortness of breath -Amoxicillin-clavulanate (Augmentin) 875-125 mg 1 tablet every 12 hours for 12 doses -Benzonatate 100 mg twice daily as needed/cough -bictegravir-emtricitabine-tenofovir AF (Biktarvy) 50 - 200 - 25 mg 1 tablet daily -Diclofenac EC 75 mg twice daily -Mirtazapine 15 mg nightly -Pitavastatin magnesium 4 mg daily      Recommendations  Based on my evaluation the patient does not appear to have an emergency medical condition.  Lucky Rathke, FNP 05/05/22  7:04 PM

## 2022-05-05 NOTE — Progress Notes (Incomplete)
   05/05/22 1725  [place] (Walk-ins at [facility] only)  How Did You Hear About [place]? Self  What Is the Reason for Your Visit/Call Today? 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  Patient states that he moved here to [place] from [place] and states that he met a woman and states that they had a baby 6 mos ago.  He states that hs girlfriend smokes marijuana and she also uses methamphetamine.  Patient states that he relaped and started using with her.  He states that in the past three months that he has lost 35 pounds.  Patient states that he has used methamphetamine on three occasions, an undetermiined amount.  He states that he has been using cocaine daily smoking $300 every two days. Patient states that he has been smoking 1/4 gram daily.  Patient denies any current withdrawal symptoms.  Patient denies SI, not previous thoughts or attempt. Patient states that he is depressed and feels shame and guilt.  He denies HI, denies Psychosis.  Patient states that he has not been sleeping well, but his appetite is improving.  Patient is requesting detox.  Patient is routine.  How Long Has This Been Causing You Problems? 1-6 months  Have You Recently Had Any Thoughts About Hurting Yourself? No  Are You Planning to Commit Suicide/Harm Yourself At This time? No  Have you Recently Had Thoughts About [place]? No  Are You Planning To Harm Someone At This Time? No  Are you currently experiencing any auditory, visual or other hallucinations? No  Have You Used Any Alcohol or Drugs in the Past 24 Hours? No  How long ago did you use Drugs or Alcohol? Last use was last Thursday  What Did You Use and How Much? Cocaine 300 dollars worth and 1/2 gram marijuana daily  Do you have any current medical co-morbidities that require immediate attention? No  Clinician description of patient physical appearance/behavior: clean and neat, cooperative  What Do You  Feel Would Help You the Most Today? Alcohol or Drug Use Treatment  If access to [facility] was not available, would you have sought care in the Emergency Department? No  Determination of Need Routine (7 days)  Options For Referral Facility-Based Crisis;BH Urgent Care

## 2022-05-05 NOTE — Progress Notes (Signed)
   05/05/22 1725  [facility] Triage Screening (Walk-ins at [facility] only)  How Did You Hear About Us? Self  What Is the Reason for Your Visit/Call Today? 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  Patient states that he moved here to [place] from [place] and states that he met a woman and states that they had a baby 6 mos ago.  He states that hs girlfriend smokes marijuana and she also uses methamphetamine.  Patient states that he relaped and started using with her.  He states that in the past three months that he has lost 35 pounds.  Patient states that he has used methamphetamine on three occasions, an undetermiined amount.  He states that he has been using cocaine daily smoking $300 every two days. Patient states that he has been smoking 1/4 gram daily.  Patient denies any current withdrawal symptoms.  Patient denies SI, not previous thoughts or attempt. Patient states that he is depressed and feels shame and guilt.  He denies HI, denies Psychosis.  Patient states that he has not been sleeping well, but his appetite is improving.  Patient is requesting detox.  Patient is routine.  How Long Has This Been Causing You Problems? 1-6 months  Have You Recently Had Any Thoughts About Hurting Yourself? No  Are You Planning to Commit Suicide/Harm Yourself At This time? No  Have you Recently Had Thoughts About Hurting Someone Else? No  Are You Planning To Harm Someone At This Time? No  Are you currently experiencing any auditory, visual or other hallucinations? No  Have You Used Any Alcohol or Drugs in the Past 24 Hours? No  How long ago did you use Drugs or Alcohol? Last use was last Thursday  What Did You Use and How Much? Cocaine 300 dollars worth and 1/2 gram marijuana daily  Do you have any current medical co-morbidities that require immediate attention? No  Clinician description of patient physical appearance/behavior: clean and neat, cooperative  What Do You  Feel Would Help You the Most Today? Alcohol or Drug Use Treatment  If access to [facility] was not available, would you have sought care in the Emergency Department? No  Determination of Need Routine (7 days)  Options For Referral Facility-Based Crisis;BH Urgent Care    

## 2022-05-05 NOTE — BH Assessment (Signed)
Comprehensive Clinical Assessment (CCA) Note  05/05/2022 Mike Harrison [id number]  Disposition: Doran Heater, NP recommends pt to be admitted to Facility Based Crisis Unit.   Flowsheet Row ED from 05/05/2022 in [facility] Most recent reading at 05/05/2022 10:03 PM ED from 05/04/2022 in [facility] Emergency Department at [facility] Most recent reading at 05/04/2022  6:44 PM ED from 05/04/2022 in [facility] Urgent Care at [facility] Most recent reading at 05/04/2022 11:27 AM  C-SSRS RISK CATEGORY No Risk No Risk No Risk      The patient demonstrates the following risk factors for suicide: Chronic risk factors for suicide include: substance use disorder. Acute risk factors for suicide include:  Pt denies, SI . Protective factors for this patient include:  Pt denies, SI . Considering these factors, the overall suicide risk at this point appears to be no risk. Patient is not appropriate for outpatient follow up.  Mike Harrison is a 62 year old male who presents voluntary and unaccompanied to [facility]. Clinician asked the pt, "what brought you to the hospital?" Pt reports, he's from [place], [place] but moved from [place], [place] then to [place], [place]. Per pt, he's been in a relationship with a woman and they have a 44 month year old son. Pt reports, the relationship is toxic, so he left and is currently homeless. Pt reports, he relapsed three months ago on Cocaine and has lost 25-30 pounds. During the assessment, the pt discussed the death of his wife in 07/15/2017, his relationship with his sons mother, their conflicts and cultural differences. Pt denies, SI, HI, AVH, self-injurious behaviors and access to weapons.   Pt reports, using a few $100's worth of Cocaine on Sunday. Pt reports, his use has increased over the past three months.  Pt reports, he used "not much," Methamphetamines. Pt reports, smoking a few joints daily. Pt reports, he just starts smoking Marijuana daily. Pt's UDS is  positive for Marijuana.   Pt presents alert, with normal speech. Pt's mood was euthymic. Pt's affect was congruent. Pt's insight was fair. Pt's judgement was good.  Diagnosis: Cocaine use Disorder (HCC).   Chief Complaint:  Chief Complaint  Patient presents with   Addiction Problem   Visit Diagnosis:     CCA Screening, Triage and Referral (STR)  Patient Reported Information How did you hear about [place]? Self  What Is the Reason for Your Visit/Call Today? 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  Patient states that he moved here to [place] from [place] and states that he met a woman and states that they had a baby 6 mos ago.  He states that hs girlfriend smokes marijuana and she also uses methamphetamine.  Patient states that he relaped and started using with her.  He states that in the past three months that he has lost 35 pounds.  Patient states that he has used methamphetamine on three occasions, an undetermiined amount.  He states that he has been using cocaine daily smoking $300 every two days. Patient states that he has been smoking 1/4 gram daily.  Patient denies any current withdrawal symptoms.  Patient denies SI, not previous thoughts or attempt. Patient states that he is depressed and feels shame and guilt.  He denies HI, denies Psychosis.  Patient states that he has not been sleeping well, but his appetite is improving.  Patient is requesting detox.  Patient is routine.  How Long Has This Been  Causing You Problems? 1-6 months  What Do You Feel Would Help You the Most Today? Alcohol or Drug Use Treatment   Have You Recently Had Any Thoughts About Hurting Yourself? No  Are You Planning to Commit Suicide/Harm Yourself At This time? No   Flowsheet Row ED from 05/05/2022 in [facility] Most recent reading at 05/05/2022 10:03 PM ED from 05/04/2022 in [facility] Emergency Department at [facility] Most recent reading at 05/04/2022  6:44 PM ED from 05/04/2022 in [facility] Urgent Care at [facility] Most recent reading at 05/04/2022 11:27 AM  C-SSRS RISK CATEGORY No Risk No Risk No Risk       Have you Recently Had Thoughts About Hurting Someone Karolee Ohs? No  Are You Planning to Harm Someone at This Time? No  Explanation: Pt denies.   Have You Used Any Alcohol or Drugs in the Past 24 Hours? No  What Did You Use and How Much? Cocaine 300 dollars worth and 1/2 gram marijuana daily   Do You Currently Have a Therapist/Psychiatrist? No  Name of Therapist/Psychiatrist: Name of Therapist/Psychiatrist: Pt denies.   Have You Been Recently Discharged From Any Office Practice or Programs? No  Explanation of Discharge From Practice/Program: None.     CCA Screening Triage Referral Assessment Type of Contact: Face-to-Face  Telemedicine Service Delivery:   Is this Initial or Reassessment?   Date Telepsych consult ordered in [facility]:    Time Telepsych consult ordered in [facility]:    Location of Assessment: [facility] Assessment Services  Provider Location: [facility] Assessment Services   Collateral Involvement: Pt denies, supports.   Does Patient Have a Automotive engineer Guardian? No  Legal Guardian Contact Information: Pt his his own guardian.  Copy of Legal Guardianship Form: -- (Pt his his own guardian.)  Legal Guardian Notified of Arrival: -- (Pt his his own guardian.)  Legal Guardian Notified of Pending Discharge: -- (Pt his his own guardian.)  If Minor and Not Living with Parent(s), Who has Custody? Pt his his own guardian.  Is CPS involved or ever been involved? In the Past  Is APS involved or ever been involved? Never   Patient Determined To Be At Risk for Harm To Self or Others Based on Review of Patient Reported Information or Presenting Complaint? No  Method: No Plan (Pt denies, SI/HI.)  Availability of Means: No access or NA (Pt denies, SI/HI.)  Intent: Vague  intent or NA (Pt denies, SI/HI.)  Notification Required: No need or identified person  Additional Information for Danger to Others Potential: -- (Pt denies, SI/HI.)  Additional Comments for Danger to Others Potential: Pt denies, SI/HI.  Are There Guns or Other Weapons in Your Home? No  Types of Guns/Weapons: Pt denies, access to weapons.  Are These Weapons Safely Secured?                            -- (Pt denies, access to weapons.)  Who Could Verify You Are Able To Have These Secured: Pt denies, access to weapons.  Do You Have any Outstanding Charges, Pending Court Dates, Parole/Probation? Pt denies, legal involvement.  Contacted To Inform of Risk of Harm To Self or Others: Other: Comment (Pt denies, SI/HI.)    Does Patient Present under Involuntary Commitment? 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   Mental Health Symptoms Depression:   Fatigue; Hopelessness; Worthlessness (Guilt/blame: for allowing himself to fall.)   Duration of Depressive symptoms:  Duration of Depressive Symptoms: Greater than two weeks   Mania:   None   Anxiety:    Worrying; Tension   Psychosis:   None   Duration of Psychotic symptoms:    Trauma:   None   Obsessions:   None   Compulsions:   None   Inattention:   None   Hyperactivity/Impulsivity:   Feeling of restlessness; Fidgets with hands/feet   Oppositional/Defiant Behaviors:   None   Emotional Irregularity:   None   Other Mood/Personality Symptoms:   Pt has some depressive/anxiety symptoms.    Mental Status Exam Appearance and self-care  Stature:   Average   Weight:   Average  weight   Clothing:   Casual   Grooming:   Normal   Cosmetic use:   None   Posture/gait:   Normal   Motor activity:   Not Remarkable   Sensorium  Attention:   Normal   Concentration:   Normal   Orientation:   X5   Recall/memory:   Normal   Affect and Mood  Affect:   Congruent   Mood:   Euthymic   Relating  Eye contact:   Normal   Facial expression:   Responsive   Attitude toward examiner:   Cooperative   Thought and Language  Speech flow:  Normal   Thought content:   Appropriate to Mood and Circumstances   Preoccupation:   None   Hallucinations:   None   Organization:   Coherent   [employer] of Knowledge:   Fair   Intelligence:   Average   Abstraction:   Normal   Judgement:   Good   Reality Testing:   Realistic   Insight:   Fair   Decision Making:   Impulsive   Social Functioning  Social Maturity:   Impulsive   Social Judgement:   "Street Smart"   Stress  Stressors:   Other (Comment) (Relationship with son's mother, their age gap and cultural differences.)   Coping Ability:   Overwhelmed; Deficient supports   Skill Deficits:   Decision making   Supports:   Support needed     Religion: Religion/Spirituality Are You A Religious Person?: Yes What is Your Religious Affiliation?: Pentecostal How Might This Affect Treatment?: Pt denies.  Leisure/Recreation: Leisure / Recreation Do You Have Hobbies?:  (None.)  Exercise/Diet: Exercise/Diet Do You Exercise?: Yes What Type of Exercise Do You Do?: Other (Comment) (Does push ups, plays basketball, softball, football.) How Many Times a Week Do You Exercise?: 1-3 times a week Have You Gained or Lost A Significant Amount of Weight in the Past Six Months?: Yes-Lost Number of Pounds Lost?:  (25-30 pounds over the last three months.) Do You Follow a Special Diet?: No Do You Have Any Trouble Sleeping?: No   CCA  Employment/Education Employment/Work Situation: Employment / Work Technical sales engineer:  (Pt reports, he resigned from job yesterday or today to take care of some personal things.) Patient's Job has Been Impacted by Current Illness: No Has Patient ever Been in the [employer]?: No  Education: Education Is Patient Currently Attending School?: No Last Grade Completed: 12 Did You Attend College?:  Yes What Type of College Degree Do you Have?: Pt has an associates degree in Substance Abuse counseling. Did You Have An Individualized Education Program (IIEP): No Did You Have Any Difficulty At School?: No Patient's Education Has Been Impacted by Current Illness: No   CCA Family/Childhood History Family and Relationship History: Family history Marital status: Widowed Widowed, when?: Since 2019. Does patient have children?: Yes How many children?: 4 How is patient's relationship with their children?: Pt reports, he has a 64, 46, 32 and a 30 month old..  Childhood History:  Childhood History By whom was/is the patient raised?: Mother Did patient suffer any verbal/emotional/physical/sexual abuse as a child?: No Did patient suffer from severe childhood neglect?: No Has patient ever been sexually abused/assaulted/raped as an adolescent or adult?: No Was the patient ever a victim of a crime or a disaster?: No Witnessed domestic violence?: No Has patient been affected by domestic violence as an adult?: No       CCA Substance Use Alcohol/Drug Use: Alcohol / Drug Use Pain Medications: See MAR Prescriptions: See MAR Over the Counter: See MAR History of alcohol / drug use?: Yes Longest period of sobriety (when/how long): 11.5 years. Negative Consequences of Use: Financial, Work / Youth worker, Scientist, research (physical sciences), Personal relationships Withdrawal Symptoms: Cramps (Feeling cold.) Substance #1 Name of Substance 1: Crack Cocaine. 1 - Age of First Use: 39. 1 - Amount (size/oz): Pt reports, using a  few $100's on Sunday. 1 - Frequency: Pt reports, his use has increased over the past three months. 1 - Duration: Ongoing. 1 - Last Use / Amount: Per pt, Sunday. 1 - Method of Aquiring: Purchase. 1- Route of Use: Smoke. Substance #2 Name of Substance 2: Methamphetamines. 2 - Age of First Use: 29. 2 - Amount (size/oz): Pt reports, "not much," on Sunday. 2 - Frequency: Pt reports, he used 2-3 times. 2 - Duration: Ongoing. 2 - Last Use / Amount: Sunday. 2 - Method of Aquiring: Purchase. 2 - Route of Substance Use: Smoke. Substance #3 Name of Substance 3: Marijuana. 3 - Age of First Use: [age]. 3 - Amount (size/oz): Pt reports, smoking a few joints daily. 3 - Frequency: Pt reports, he just starts smoking Marijuana daily. 3 - Duration: Ongoing. 3 - Last Use / Amount: Per pt, Sunday. 3 - Method of Aquiring: Purchase. 3 - Route of Substance Use: Smoke.    ASAM's:  Six Dimensions of Multidimensional Assessment  Dimension 1:  Acute Intoxication and/or Withdrawal Potential:   Dimension 1:  Description of individual's past and current experiences of substance use and withdrawal: Pt reports, having cramps and feeling cold.  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  Dimension 3:  Emotional, Behavioral, or Cognitive Conditions and Complications:     Dimension 4:  Readiness to Change:  Dimension 4:  Description of Readiness to Change criteria: Pt is ready to make needed changes in his life.  Dimension 5:  Relapse, Continued use, or Continued Problem Potential:  Dimension 5:  Relapse, continued use, or continued problem potential critiera description: Pt reports, he relasped in 2019, was sober in 2020 and relasped again three months ago.  Dimension 6:  Recovery/Living Environment:  Dimension 6:  Recovery/Iiving  environment criteria description: Pt reports, he was living with his son's mother but recently left due the the toxic enviorment. Pt reports,  he is currently homeless.  ASAM Severity Score:    ASAM Recommended Level of Treatment: ASAM Recommended Level of Treatment: Level II Intensive Outpatient Treatment   Substance use Disorder (SUD) Substance Use Disorder (SUD)  Checklist Symptoms of Substance Use: Continued use despite having a persistent/recurrent physical/psychological problem caused/exacerbated by use, Continued use despite persistent or recurrent social, interpersonal problems, caused or exacerbated by use  Recommendations for Services/Supports/Treatments: Recommendations for Services/Supports/Treatments Recommendations For Services/Supports/Treatments: Facility Based Crisis, Individual Therapy, Inpatient Hospitalization, Medication Management, Partial Hospitalization  Discharge Disposition: Discharge Disposition Medical Exam completed: Yes  DSM5 Diagnoses: Patient Active Problem List   Diagnosis Date Noted   Cocaine use disorder ([place]) 05/05/2022   Allergy to alpha-gal 11/10/2021   Toe pain    Onychomycosis    Allergic reaction to alpha-gal 02/12/2021   Arthritis    Pruritus    Asthma    Smoker    Hypertension 12/31/2020   Hyperlipidemia 12/31/2020   HIV disease ([place]) 09/02/2019     Referrals to Alternative Service(s): Referred to Alternative Service(s):   Place:   Date:   Time:    Referred to Alternative Service(s):   Place:   Date:   Time:    Referred to Alternative Service(s):   Place:   Date:   Time:    Referred to Alternative Service(s):   Place:   Date:   Time:     Vertell Novak, [facility] Comprehensive Clinical Assessment (CCA) Screening, Triage and Referral Note  05/05/2022 Mike Harrison [id number]  Chief Complaint:  Chief Complaint  Patient presents with   Addiction Problem   Visit Diagnosis:   Patient Reported Information How did you hear about [place]?  Self  What Is the Reason for Your Visit/Call Today? 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  Patient states that he moved here to [place] from [place] and states that he met a woman and states that they had a baby 6 mos ago.  He states that hs girlfriend smokes marijuana and she also uses methamphetamine.  Patient states that he relaped and started using with her.  He states that in the past three months that he has lost 35 pounds.  Patient states that he has used methamphetamine on three occasions, an undetermiined amount.  He states that he has been using cocaine daily smoking $300 every two days. Patient states that he has been smoking 1/4 gram daily.  Patient denies any current withdrawal symptoms.  Patient denies SI, not previous thoughts or attempt. Patient states that he is depressed and feels shame and guilt.  He denies HI, denies Psychosis.  Patient states that he has not been sleeping well, but his appetite is improving.  Patient is requesting detox.  Patient is routine.  How Long Has This Been Causing You Problems? 1-6 months  What Do You Feel Would Help You the Most Today? Alcohol or Drug Use Treatment   Have You Recently Had Any Thoughts About Hurting Yourself? No  Are You Planning to Commit Suicide/Harm Yourself At This time? No   Have you Recently Had Thoughts About [place]? No  Are You Planning to Harm Someone at This Time? No  Explanation: Pt denies.   Have You Used Any Alcohol or Drugs in the Past 24 Hours? No  How Long Ago Did You Use Drugs or Alcohol? Pt reports, on Sunday.  What Did You Use and How Much? Cocaine 300 dollars worth and 1/2 gram  marijuana daily   Do You Currently Have a Therapist/Psychiatrist? No  Name of Therapist/Psychiatrist: Pt denies.   Have You Been Recently Discharged From Any Office Practice or Programs? No  Explanation of Discharge From Practice/Program: None.    CCA  Screening Triage Referral Assessment Type of Contact: Face-to-Face  Telemedicine Service Delivery:   Is this Initial or Reassessment?   Date Telepsych consult ordered in [facility]:    Time Telepsych consult ordered in [facility]:    Location of Assessment: [facility] Assessment Services  Provider Location: [facility] Assessment Services    Collateral Involvement: Pt denies, supports.   Does Patient Have a Automotive engineer Guardian? Pt is his own guardian.  Name and Contact of Legal Guardian: Pt is his own guardian.  If Minor and Not Living with Parent(s), Who has Custody? Pt his his own guardian.  Is CPS involved or ever been involved? In the Past  Is APS involved or ever been involved? Never   Patient Determined To Be At Risk for Harm To Self or Others Based on Review of Patient Reported Information or Presenting Complaint? No  Method: No Plan (Pt denies, SI/HI.)  Availability of Means: No access or NA (Pt denies, SI/HI.)  Intent: Vague intent or NA (Pt denies, SI/HI.)  Notification Required: No need or identified person  Additional Information for Danger to Others Potential: -- (Pt denies, SI/HI.)  Additional Comments for Danger to Others Potential: Pt denies, SI/HI.  Are There Guns or Other Weapons in Your Home? No  Types of Guns/Weapons: Pt denies, access to weapons.  Are These Weapons Safely Secured?                            -- (Pt denies, access to weapons.)  Who Could Verify You Are Able To Have These Secured: Pt denies, access to weapons.  Do You Have any Outstanding Charges, Pending Court Dates, Parole/Probation? Pt denies, legal involvement.  Contacted To Inform of Risk of Harm To Self or Others: Other: Comment (Pt denies, SI/HI.)   Does Patient Present under Involuntary Commitment? No    [place] of Residence: [place]   Patient Currently Receiving the Following Services: Not Receiving Services   Determination of Need: Routine (7 days)   Options For  Referral: Facility-Based Crisis; [facility]   Discharge Disposition:  Discharge Disposition Medical Exam completed: Yes  Redmond Pulling, [facility]     Redmond Pulling, MS, [facility], [facility] Triage Specialist [phone]

## 2022-05-06 DIAGNOSIS — Z716 Tobacco abuse counseling: Secondary | ICD-10-CM | POA: Diagnosis not present

## 2022-05-06 DIAGNOSIS — Z1152 Encounter for screening for COVID-19: Secondary | ICD-10-CM | POA: Diagnosis not present

## 2022-05-06 DIAGNOSIS — F1410 Cocaine abuse, uncomplicated: Secondary | ICD-10-CM | POA: Diagnosis not present

## 2022-05-06 DIAGNOSIS — F1414 Cocaine abuse with cocaine-induced mood disorder: Secondary | ICD-10-CM | POA: Diagnosis not present

## 2022-05-06 DIAGNOSIS — F1590 Other stimulant use, unspecified, uncomplicated: Secondary | ICD-10-CM | POA: Diagnosis not present

## 2022-05-06 DIAGNOSIS — F1290 Cannabis use, unspecified, uncomplicated: Secondary | ICD-10-CM | POA: Diagnosis not present

## 2022-05-06 DIAGNOSIS — F17210 Nicotine dependence, cigarettes, uncomplicated: Secondary | ICD-10-CM | POA: Diagnosis not present

## 2022-05-06 DIAGNOSIS — R69 Illness, unspecified: Secondary | ICD-10-CM | POA: Diagnosis not present

## 2022-05-06 DIAGNOSIS — Z79899 Other long term (current) drug therapy: Secondary | ICD-10-CM | POA: Diagnosis not present

## 2022-05-06 DIAGNOSIS — Z5900 Homelessness unspecified: Secondary | ICD-10-CM | POA: Diagnosis not present

## 2022-05-06 MED ORDER — MIRTAZAPINE 30 MG PO TABS
30.0000 mg | ORAL_TABLET | Freq: Every day | ORAL | Status: DC
  Administered 2022-05-06 – 2022-05-09 (×4): 30 mg via ORAL
  Filled 2022-05-06 (×4): qty 1

## 2022-05-06 NOTE — ED Provider Notes (Signed)
[facility]BC Progress Note  Date and Time: 05/06/2022 2:51 PM Name: Mike Harrison MRN:  [id number]  Reason For Admission: Mike Harrison is a 62 yo w/ hx of MDD, cocaine use disorder presenting to [facility] for substance use treatment and rehab.   Subjective:  Seen and assessed at bedside. Eating and sleeping fairly well. Reports continued depression and anxiety. Agreeable to increasing remeron back to patient's preferred dose of 30 mg nightly. Denies SI/HI/AVH.   Seeking sober living in [place]. He wanted assistance with transportation to both home to pack his belongings and to [place]. Defer to LCSW regarding what can be done for this.  Diagnosis:  Final diagnoses:  Cocaine use disorder ([place])    Total Time spent with patient: 45 minutes   Labs  Lab Results:     Latest Ref Rng & Units 05/05/2022    8:00 PM 04/13/2022   11:20 PM 11/10/2021   10:52 AM  CBC  WBC 4.0 - 10.5 K/uL 13.8  6.4  7.1   Hemoglobin 13.0 - 17.0 g/dL 14.9  14.5  15.3   Hematocrit 39.0 - 52.0 % 43.5  41.0  44.2   Platelets 150 - 400 K/uL 265  321  297       Latest Ref Rng & Units 05/05/2022    8:00 PM 04/13/2022   11:20 PM 11/10/2021   10:52 AM  CMP  Glucose 70 - 99 mg/dL 102  105  96   BUN 6 - 20 mg/dL 11  8  15   $ Creatinine 0.61 - 1.24 mg/dL 1.03  1.09  1.32   Sodium 135 - 145 mmol/L 139  137  140   Potassium 3.5 - 5.1 mmol/L 4.0  3.5  4.0   Chloride 98 - 111 mmol/L 102  105  107   CO2 22 - 32 mmol/L 28  24  27   $ Calcium 8.9 - 10.3 mg/dL 9.1  9.1  9.5   Total Protein 6.5 - 8.1 g/dL 6.5   7.9   Total Bilirubin 0.3 - 1.2 mg/dL 0.7   0.8   Alkaline Phos 38 - 126 U/L 61     AST 15 - 41 U/L 20   19   ALT 0 - 44 U/L 21   20     Physical Findings   PHQ2-9    Flowsheet Row ED from 05/05/2022 in [facility] Office Visit from 11/10/2021 in [facility] for Infectious Disease  PHQ-2 Total Score 5 0  PHQ-9 Total Score 22 --      Flowsheet Row ED from 05/05/2022 in [facility] Most recent reading at 05/05/2022 10:03 PM ED from 05/04/2022 in [facility] Emergency Department at [facility] Most recent reading at 05/04/2022  6:44 PM ED from 05/04/2022 in [facility] at [place] Most recent reading at 05/04/2022 11:27 AM  C-SSRS RISK CATEGORY No Risk No Risk No Risk        Musculoskeletal  Strength & Muscle Tone: within normal limits Gait & Station: normal Patient leans: N/A  Psychiatric Specialty Exam  Presentation  General Appearance:  Appropriate for Environment; Casual   Eye Contact: Good   Speech: Clear and Coherent; Normal Rate   Speech Volume: Normal   Handedness: Right    Mood and Affect  Mood: Euthymic   Affect: Appropriate; Congruent    Thought Process  Thought Processes: Coherent; Goal Directed; Linear   Descriptions of Associations:Intact   Orientation:Full (  Time, Place and Person)   Thought Content:Logical; WDL   Diagnosis of Schizophrenia or Schizoaffective disorder in past: No     Hallucinations:Hallucinations: None   Ideas of Reference:None   Suicidal Thoughts:Suicidal Thoughts: No   Homicidal Thoughts:Homicidal Thoughts: No    Sensorium  Memory: Immediate Good; Recent Good   Judgment: Good   Insight: Good    Executive Functions  Concentration: Good   Attention Span: Good   Recall: Good   Fund of Knowledge: Good   Language: Good    Psychomotor Activity  Psychomotor Activity: Psychomotor Activity: Normal    Assets  Assets: Communication Skills; Desire for Improvement; Leisure Time; Physical Health; Resilience; Social Support    Sleep  Sleep: Sleep: Fair    Physical Exam  Physical Exam Vitals and nursing note reviewed.  Constitutional:      General: He is not in acute distress.    Appearance: He is well-developed.  HENT:     Head: Normocephalic and atraumatic.  Eyes:     Conjunctiva/sclera: Conjunctivae  normal.  Cardiovascular:     Rate and Rhythm: Normal rate and regular rhythm.     Heart sounds: No murmur heard. Pulmonary:     Effort: Pulmonary effort is normal. No respiratory distress.     Breath sounds: Normal breath sounds.  Abdominal:     Palpations: Abdomen is soft.     Tenderness: There is no abdominal tenderness.  Musculoskeletal:        General: No swelling.     Cervical back: Neck supple.  Skin:    General: Skin is warm and dry.     Capillary Refill: Capillary refill takes less than 2 seconds.  Neurological:     Mental Status: He is alert.  Psychiatric:        Mood and Affect: Mood normal.    Review of Systems  Constitutional:  Negative for chills and fever.  Respiratory:  Negative for cough, shortness of breath and wheezing.   Cardiovascular:  Negative for chest pain and palpitations.  Gastrointestinal:  Negative for abdominal pain, nausea and vomiting.  Skin:  Negative for itching and rash.  Neurological:  Negative for dizziness and headaches.   Blood pressure 132/81, pulse 68, temperature 99 F (37.2 C), temperature source Oral, resp. rate 20, SpO2 98 %. There is no height or weight on file to calculate BMI.  ASSESSMENT Mike Harrison is a 62 yo w/ hx of MDD, cocaine use disorder presenting to [facility] for substance use treatment and rehab.   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

## 2022-05-06 NOTE — ED Notes (Signed)
Patient calm and pleasant, making needs known to staff.  No distress or complaint.  Will monitor and provide support.

## 2022-05-06 NOTE — ED Notes (Signed)
Pt resting in bed. A&O x4, calm and cooperative. Denies current SI/HI/AVH. No signs of distress noted. Monitoring for safety. 

## 2022-05-06 NOTE — ED Notes (Signed)
Patient A&Ox4. Patient was admitted to [facility] after relapsing on cocaine and now wants long term detox. Patient denies SI/Hi and AVH. Denies any physical complaints when asked. No acute distress noted. Support and encouragement provided. Routine safety checks conducted according to facility protocol. Encouraged patient to notify staff if thoughts of harm toward self or others arise. Patient verbalize understanding and agreement. Will continue to monitor for safety.

## 2022-05-06 NOTE — ED Notes (Signed)
Pt asleep in bed. Respirations even and unlabored. Monitoring for safety. 

## 2022-05-07 DIAGNOSIS — F1590 Other stimulant use, unspecified, uncomplicated: Secondary | ICD-10-CM | POA: Diagnosis not present

## 2022-05-07 DIAGNOSIS — Z79899 Other long term (current) drug therapy: Secondary | ICD-10-CM | POA: Diagnosis not present

## 2022-05-07 DIAGNOSIS — F17210 Nicotine dependence, cigarettes, uncomplicated: Secondary | ICD-10-CM | POA: Diagnosis not present

## 2022-05-07 DIAGNOSIS — Z5900 Homelessness unspecified: Secondary | ICD-10-CM | POA: Diagnosis not present

## 2022-05-07 DIAGNOSIS — F1290 Cannabis use, unspecified, uncomplicated: Secondary | ICD-10-CM | POA: Diagnosis not present

## 2022-05-07 DIAGNOSIS — F1414 Cocaine abuse with cocaine-induced mood disorder: Secondary | ICD-10-CM | POA: Diagnosis not present

## 2022-05-07 DIAGNOSIS — R69 Illness, unspecified: Secondary | ICD-10-CM | POA: Diagnosis not present

## 2022-05-07 DIAGNOSIS — F1410 Cocaine abuse, uncomplicated: Secondary | ICD-10-CM | POA: Diagnosis not present

## 2022-05-07 DIAGNOSIS — Z1152 Encounter for screening for COVID-19: Secondary | ICD-10-CM | POA: Diagnosis not present

## 2022-05-07 DIAGNOSIS — Z716 Tobacco abuse counseling: Secondary | ICD-10-CM | POA: Diagnosis not present

## 2022-05-07 NOTE — ED Provider Notes (Signed)
Behavioral Health Progress Note  Date and Time: 05/07/2022 1:15 PM Name: Mike Harrison MRN:  [id number]  Subjective:   Mike Harrison is a 62 yr old male who presented to [facility] on 2/8 requesting to detox from Cocaine, he was admitted to [facility] on 2/8.  PPHx is significant for MDD and Cocaine Abuse, and no history of Suicide Attempt or Self Injurious Behavior.  He reports that he is doing okay today.  He reports that he is having some withdrawal symptoms of sweats and fatigue.  He reports having no cravings.  He reports that he tolerated the increase in his Remeron without issue last night.  He reports not having any effect on his sleep last night.  He reports that he will be increasing his range of places sober living facilities from just [place] to also include those in [place].  He reports that he will be calling today to inquire about availability.  He reports no SI, HI, or AVH.  He reports his sleep is fair, up and down.  He reports his appetite is good.  He reports some low back pain but otherwise reports no other concerns at present.  Diagnosis:  Final diagnoses:  Cocaine use disorder ([place])    Total Time spent with patient: 30 minutes  Past Psychiatric History: MDD and Cocaine Abuse, and no history of Suicide Attempt or Self Injurious Behavior. Past Medical History: HIV, Hypertension, hyperlipidemia, arthritis, asthma, pruritus  Family History:  None reported  Family Psychiatric  History:  None reported  Social History: Currently homeless, requested leave of absence to seek substance use treatment from employer   Additional Social History:    Pain Medications: See MAR Prescriptions: See MAR Over the Counter: See MAR History of alcohol / drug use?: Yes Longest period of sobriety (when/how long): 11.5 years. Negative Consequences of Use: Financial, Work / Youth worker, Scientist, research (physical sciences), Personal relationships Withdrawal Symptoms: Cramps (Feeling cold.) Name of Substance 1: Crack Cocaine. 1 - Age of  First Use: 39. 1 - Amount (size/oz): Pt reports, using a few $100's on Sunday. 1 - Frequency: Pt reports, his use has increased over the past three months. 1 - Duration: Ongoing. 1 - Last Use / Amount: Per pt, Sunday. 1 - Method of Aquiring: Purchase. 1- Route of Use: Smoke. Name of Substance 2: Methamphetamines. 2 - Age of First Use: 47. 2 - Amount (size/oz): Pt reports, "not much," on Sunday. 2 - Frequency: Pt reports, he used 2-3 times. 2 - Duration: Ongoing. 2 - Last Use / Amount: Sunday. 2 - Method of Aquiring: Purchase. 2 - Route of Substance Use: Smoke. Name of Substance 3: Marijuana. 3 - Age of First Use: [age]. 3 - Amount (size/oz): Pt reports, smoking a few joints daily. 3 - Frequency: Pt reports, he just starts smoking Marijuana daily. 3 - Duration: Ongoing. 3 - Last Use / Amount: Per pt, Sunday. 3 - Method of Aquiring: Purchase. 3 - Route of Substance Use: Smoke.              Sleep: Fair up and down  Appetite:  Good  Current Medications:  Current Facility-Administered Medications  Medication Dose Route Frequency Provider Last Rate Last Admin   acetaminophen (TYLENOL) tablet 650 mg  650 mg Oral Q6H PRN Lucky Rathke, FNP       albuterol (VENTOLIN HFA) 108 (90 Base) MCG/ACT inhaler 2 puff  2 puff Inhalation Q2H PRN Lucky Rathke, FNP       alum & mag hydroxide-simeth (MAALOX/MYLANTA) 200-200-20  MG/5ML suspension 30 mL  30 mL Oral Q4H PRN Lucky Rathke, FNP       amoxicillin-clavulanate (AUGMENTIN) 875-125 MG per tablet 1 tablet  1 tablet Oral Q12H Lucky Rathke, FNP   1 tablet at 05/07/22 0911   atorvastatin (LIPITOR) tablet 20 mg  20 mg Oral Daily Hampton Abbot, MD   20 mg at 05/07/22 0911   benzonatate (TESSALON) capsule 100 mg  100 mg Oral BID PRN Lucky Rathke, FNP   100 mg at 05/05/22 2103   bictegravir-emtricitabine-tenofovir AF (BIKTARVY) 50-200-25 MG per tablet 1 tablet  1 tablet Oral Daily Lucky Rathke, FNP   1 tablet at 05/07/22 [id number]   diclofenac  (VOLTAREN) EC tablet 75 mg  75 mg Oral BID Lucky Rathke, FNP   75 mg at 05/06/22 2220   hydrOXYzine (ATARAX) tablet 25 mg  25 mg Oral TID PRN Lucky Rathke, FNP   25 mg at 05/05/22 2101   magnesium hydroxide (MILK OF MAGNESIA) suspension 30 mL  30 mL Oral Daily PRN Lucky Rathke, FNP       mirtazapine (REMERON) tablet 30 mg  30 mg Oral Aliene Altes, MD   30 mg at 05/06/22 2141   traZODone (DESYREL) tablet 50 mg  50 mg Oral QHS PRN Lucky Rathke, FNP       Current Outpatient Medications  Medication Sig Dispense Refill   amoxicillin-clavulanate (AUGMENTIN) 875-125 MG tablet Take 1 tablet by mouth every 12 (twelve) hours. (Patient taking differently: Take 1 tablet by mouth every 12 (twelve) hours. Take for 7 days starting on 05/04/22.) 14 tablet 0   bictegravir-emtricitabine-tenofovir AF (BIKTARVY) 50-200-25 MG TABS tablet Take 1 tablet by mouth daily. 30 tablet 11   mirtazapine (REMERON) 15 MG tablet Take 1 tablet (15 mg total) by mouth at bedtime. 30 tablet 5   Multiple Vitamins-Iron (MULTIVITAMIN/IRON PO) Take 1 tablet by mouth daily.     Pitavastatin Magnesium 4 MG TABS Take 1 tablet by mouth daily. 30 tablet 11   Vitamin D, Cholecalciferol, 25 MCG (1000 UT) TABS Take 2,000 Units by mouth every other day.      Labs  Lab Results:  Admission on 05/05/2022  Component Date Value Ref Range Status   SARS Coronavirus 2 by RT PCR 05/05/2022 NEGATIVE  NEGATIVE Final   Influenza A by PCR 05/05/2022 NEGATIVE  NEGATIVE Final   Influenza B by PCR 05/05/2022 NEGATIVE  NEGATIVE Final   Comment: (NOTE) The Xpert Xpress SARS-CoV-2/FLU/RSV plus assay is intended as an aid in the diagnosis of influenza from Nasopharyngeal swab specimens and should not be used as a sole basis for treatment. Nasal washings and aspirates are unacceptable for Xpert Xpress SARS-CoV-2/FLU/RSV testing.  Fact Sheet for Patients: EntrepreneurPulse.com.au  Fact Sheet for Healthcare  Providers: IncredibleEmployment.be  This test is not yet approved or cleared by the [place] FDA and has been authorized for detection and/or diagnosis of SARS-CoV-2 by FDA under an Emergency Use Authorization (EUA). This EUA will remain in effect (meaning this test can be used) for the duration of the COVID-19 declaration under Section 564(b)(1) of the Act, 21 U.S.C. section 360bbb-3(b)(1), unless the authorization is terminated or revoked.     Resp Syncytial Virus by PCR 05/05/2022 NEGATIVE  NEGATIVE Final   Comment: (NOTE) Fact Sheet for Patients: EntrepreneurPulse.com.au  Fact Sheet for Healthcare Providers: IncredibleEmployment.be  This test is not yet approved or cleared by the [place] FDA and has been authorized for detection  and/or diagnosis of SARS-CoV-2 by FDA under an Emergency Use Authorization (EUA). This EUA will remain in effect (meaning this test can be used) for the duration of the COVID-19 declaration under Section 564(b)(1) of the Act, 21 U.S.C. section 360bbb-3(b)(1), unless the authorization is terminated or revoked.  Performed at [facility], [place] [address] [place], [place] [address]    WBC 05/05/2022 13.8 (H)  4.0 - 10.5 K/uL Final   RBC 05/05/2022 4.55  4.22 - 5.81 MIL/uL Final   Hemoglobin 05/05/2022 14.9  13.0 - 17.0 g/dL Final   HCT 05/05/2022 43.5  39.0 - 52.0 % Final   MCV 05/05/2022 95.6  80.0 - 100.0 fL Final   MCH 05/05/2022 32.7  26.0 - 34.0 pg Final   MCHC 05/05/2022 34.3  30.0 - 36.0 g/dL Final   RDW 05/05/2022 13.1  11.5 - 15.5 % Final   Platelets 05/05/2022 265  150 - 400 K/uL Final   nRBC 05/05/2022 0.0  0.0 - 0.2 % Final   Neutrophils Relative % 05/05/2022 77  % Final   Neutro Abs 05/05/2022 10.6 (H)  1.7 - 7.7 K/uL Final   Lymphocytes Relative 05/05/2022 14  % Final   Lymphs Abs 05/05/2022 1.9  0.7 - 4.0 K/uL Final   Monocytes Relative 05/05/2022 7  % Final    Monocytes Absolute 05/05/2022 1.0  0.1 - 1.0 K/uL Final   Eosinophils Relative 05/05/2022 1  % Final   Eosinophils Absolute 05/05/2022 0.2  0.0 - 0.5 K/uL Final   Basophils Relative 05/05/2022 1  % Final   Basophils Absolute 05/05/2022 0.1  0.0 - 0.1 K/uL Final   Immature Granulocytes 05/05/2022 0  % Final   Abs Immature Granulocytes 05/05/2022 0.06  0.00 - 0.07 K/uL Final   Performed at [facility], [place] [address], [place], [place] [address]   Sodium 05/05/2022 139  135 - 145 mmol/L Final   Potassium 05/05/2022 4.0  3.5 - 5.1 mmol/L Final   Chloride 05/05/2022 102  98 - 111 mmol/L Final   CO2 05/05/2022 28  22 - 32 mmol/L Final   Glucose, Bld 05/05/2022 102 (H)  70 - 99 mg/dL Final   Glucose reference range applies only to samples taken after fasting for at least 8 hours.   BUN 05/05/2022 11  6 - 20 mg/dL Final   Creatinine, Ser 05/05/2022 1.03  0.61 - 1.24 mg/dL Final   Calcium 05/05/2022 9.1  8.9 - 10.3 mg/dL Final   Total Protein 05/05/2022 6.5  6.5 - 8.1 g/dL Final   Albumin 05/05/2022 3.6  3.5 - 5.0 g/dL Final   AST 05/05/2022 20  15 - 41 U/L Final   ALT 05/05/2022 21  0 - 44 U/L Final   Alkaline Phosphatase 05/05/2022 61  38 - 126 U/L Final   Total Bilirubin 05/05/2022 0.7  0.3 - 1.2 mg/dL Final   GFR, Estimated 05/05/2022 >60  >60 mL/min Final   Comment: (NOTE) Calculated using the CKD-EPI Creatinine Equation (2021)    Anion gap 05/05/2022 9  5 - 15 Final   Performed at [facility] [address] [place], [place] [address]   Hgb A1c MFr Bld 05/05/2022 5.2  4.8 - 5.6 % Final   Comment: (NOTE) Pre diabetes:          5.7%-6.4%  Diabetes:              >6.4%  Glycemic control for   <7.0% adults with diabetes  Mean Plasma Glucose 05/05/2022 102.54  mg/dL Final   Performed at [facility] [address] [place], [place] [address]   Magnesium 05/05/2022 2.3  1.7 - 2.4 mg/dL Final   Performed at [facility] [address] [place], [place]  [address]   Alcohol, Ethyl (B) 05/05/2022 <10  <10 mg/dL Final   Comment: (NOTE) Lowest detectable limit for serum alcohol is 10 mg/dL.  For medical purposes only. Performed at [facility], [place] [address], [place], [place] [address]    POC Amphetamine UR 05/05/2022 None Detected  NONE DETECTED (Cut Off Level 1000 ng/mL) Final   POC Secobarbital (BAR) 05/05/2022 None Detected  NONE DETECTED (Cut Off Level 300 ng/mL) Final   POC Buprenorphine (BUP) 05/05/2022 None Detected  NONE DETECTED (Cut Off Level 10 ng/mL) Final   POC Oxazepam (BZO) 05/05/2022 None Detected  NONE DETECTED (Cut Off Level 300 ng/mL) Final   POC Cocaine UR 05/05/2022 None Detected  NONE DETECTED (Cut Off Level 300 ng/mL) Final   POC Methamphetamine UR 05/05/2022 None Detected  NONE DETECTED (Cut Off Level 1000 ng/mL) Final   POC Morphine 05/05/2022 None Detected  NONE DETECTED (Cut Off Level 300 ng/mL) Final   POC Methadone UR 05/05/2022 None Detected  NONE DETECTED (Cut Off Level 300 ng/mL) Final   POC Oxycodone UR 05/05/2022 None Detected  NONE DETECTED (Cut Off Level 100 ng/mL) Final   POC Marijuana UR 05/05/2022 Positive (A)  NONE DETECTED (Cut Off Level 50 ng/mL) Final   Cholesterol 05/05/2022 125  0 - 200 mg/dL Final   Triglycerides 05/05/2022 150 (H)  <150 mg/dL Final   HDL 05/05/2022 49  >40 mg/dL Final   Total CHOL/HDL Ratio 05/05/2022 2.6  RATIO Final   VLDL 05/05/2022 30  0 - 40 mg/dL Final   LDL Cholesterol 05/05/2022 46  0 - 99 mg/dL Final   Comment:        Total Cholesterol/HDL:CHD Risk Coronary Heart Disease Risk Table                     Men   Women  1/2 Average Risk   3.4   3.3  Average Risk       5.0   4.4  2 X Average Risk   9.6   7.1  3 X Average Risk  23.4   11.0        Use the calculated Patient Ratio above and the CHD Risk Table to determine the patient's CHD Risk.        ATP III CLASSIFICATION (LDL):  <100     mg/dL   Optimal  100-129  mg/dL   Near or Above                     Optimal  130-159  mg/dL   Borderline  160-189  mg/dL   High  >190     mg/dL   Very High Performed at [facility] [address], [place], [place] [address]    TSH 05/05/2022 0.665  0.350 - 4.500 uIU/mL Final   Comment: Performed by a 3rd Generation assay with a functional sensitivity of <=0.01 uIU/mL. Performed at [facility], [place] [address], [place], [place] [address]    SARSCOV2ONAVIRUS 2 AG 05/05/2022 NEGATIVE  NEGATIVE Final   Comment: (NOTE) SARS-CoV-2 antigen NOT DETECTED.   Negative results are presumptive.  Negative results do not preclude SARS-CoV-2 infection and should  not be used as the sole basis for treatment or other patient management decisions, including infection  control decisions, particularly in the presence of clinical signs and  symptoms consistent with COVID-19, or in those who have been in contact with the virus.  Negative results must be combined with clinical observations, patient history, and epidemiological information. The expected result is Negative.  Fact Sheet for Patients: HandmadeRecipes.com.cy  Fact Sheet for Healthcare Providers: FuneralLife.at  This test is not yet approved or cleared by the [place] FDA and  has been authorized for detection and/or diagnosis of SARS-CoV-2 by FDA under an Emergency Use Authorization (EUA).  This EUA will remain in effect (meaning this test can be used) for the duration of  the COV                          ID-19 declaration under Section 564(b)(1) of the Act, 21 U.S.C. section 360bbb-3(b)(1), unless the authorization is terminated or revoked sooner.    Admission on 05/04/2022, Discharged on 05/04/2022  Component Date Value Ref Range Status   SARS Coronavirus 2 by RT PCR 05/04/2022 NEGATIVE  NEGATIVE Final   Influenza A by PCR 05/04/2022 NEGATIVE  NEGATIVE Final   Influenza B by PCR 05/04/2022 NEGATIVE  NEGATIVE Final   Comment: (NOTE) The  Xpert Xpress SARS-CoV-2/FLU/RSV plus assay is intended as an aid in the diagnosis of influenza from Nasopharyngeal swab specimens and should not be used as a sole basis for treatment. Nasal washings and aspirates are unacceptable for Xpert Xpress SARS-CoV-2/FLU/RSV testing.  Fact Sheet for Patients: EntrepreneurPulse.com.au  Fact Sheet for Healthcare Providers: IncredibleEmployment.be  This test is not yet approved or cleared by the [place] FDA and has been authorized for detection and/or diagnosis of SARS-CoV-2 by FDA under an Emergency Use Authorization (EUA). This EUA will remain in effect (meaning this test can be used) for the duration of the COVID-19 declaration under Section 564(b)(1) of the Act, 21 U.S.C. section 360bbb-3(b)(1), unless the authorization is terminated or revoked.     Resp Syncytial Virus by PCR 05/04/2022 NEGATIVE  NEGATIVE Final   Comment: (NOTE) Fact Sheet for Patients: EntrepreneurPulse.com.au  Fact Sheet for Healthcare Providers: IncredibleEmployment.be  This test is not yet approved or cleared by the [place] FDA and has been authorized for detection and/or diagnosis of SARS-CoV-2 by FDA under an Emergency Use Authorization (EUA). This EUA will remain in effect (meaning this test can be used) for the duration of the COVID-19 declaration under Section 564(b)(1) of the Act, 21 U.S.C. section 360bbb-3(b)(1), unless the authorization is terminated or revoked.  Performed at [facility], [place] [address], [place], [place] [address]   Admission on 04/14/2022, Discharged on 04/14/2022  Component Date Value Ref Range Status   WBC 04/13/2022 6.4  4.0 - 10.5 K/uL Final   RBC 04/13/2022 4.50  4.22 - 5.81 MIL/uL Final   Hemoglobin 04/13/2022 14.5  13.0 - 17.0 g/dL Final   HCT 04/13/2022 41.0  39.0 - 52.0 % Final   MCV 04/13/2022 91.1  80.0 - 100.0 fL Final   MCH  04/13/2022 32.2  26.0 - 34.0 pg Final   MCHC 04/13/2022 35.4  30.0 - 36.0 g/dL Final   RDW 04/13/2022 12.0  11.5 - 15.5 % Final   Platelets 04/13/2022 321  150 - 400 K/uL Final   nRBC 04/13/2022 0.0  0.0 - 0.2 % Final   Neutrophils Relative % 04/13/2022 58  % Final  Neutro Abs 04/13/2022 3.7  1.7 - 7.7 K/uL Final   Lymphocytes Relative 04/13/2022 28  % Final   Lymphs Abs 04/13/2022 1.8  0.7 - 4.0 K/uL Final   Monocytes Relative 04/13/2022 9  % Final   Monocytes Absolute 04/13/2022 0.6  0.1 - 1.0 K/uL Final   Eosinophils Relative 04/13/2022 4  % Final   Eosinophils Absolute 04/13/2022 0.3  0.0 - 0.5 K/uL Final   Basophils Relative 04/13/2022 1  % Final   Basophils Absolute 04/13/2022 0.1  0.0 - 0.1 K/uL Final   Immature Granulocytes 04/13/2022 0  % Final   Abs Immature Granulocytes 04/13/2022 0.02  0.00 - 0.07 K/uL Final   Performed at [facility], [place], [place] [address]   Sodium 04/13/2022 137  135 - 145 mmol/L Final   Potassium 04/13/2022 3.5  3.5 - 5.1 mmol/L Final   Chloride 04/13/2022 105  98 - 111 mmol/L Final   CO2 04/13/2022 24  22 - 32 mmol/L Final   Glucose, Bld 04/13/2022 105 (H)  70 - 99 mg/dL Final   Glucose reference range applies only to samples taken after fasting for at least 8 hours.   BUN 04/13/2022 8  6 - 20 mg/dL Final   Creatinine, Ser 04/13/2022 1.09  0.61 - 1.24 mg/dL Final   Calcium 04/13/2022 9.1  8.9 - 10.3 mg/dL Final   GFR, Estimated 04/13/2022 >60  >60 mL/min Final   Comment: (NOTE) Calculated using the CKD-EPI Creatinine Equation (2021)    Anion gap 04/13/2022 8  5 - 15 Final   Performed at [facility], [address], [place],  [address]  Office Visit on 11/10/2021  Component Date Value Ref Range Status   HIV 1 RNA Quant 11/10/2021 Not Detected  Copies/mL Final   HIV-1 RNA Quant, Log 11/10/2021 Not Detected  Log cps/mL Final   Comment: . Reference Range:                            Not Detected     copies/mL                           Not Detected Log copies/mL . Marland Kitchen The test was performed using Real-Time Polymerase Chain Reaction. . . Reportable Range: 20 copies/mL to 10,000,000 copies/mL (1.30 Log copies/mL to 7.00 Log copies/mL). .    RPR Ser Ql 11/10/2021 NON-REACTIVE  NON-REACTIVE Final   Glucose, Bld 11/10/2021 96  65 - 99 mg/dL Final   Comment: .            Fasting reference interval .    BUN 11/10/2021 15  7 - 25 mg/dL Final   Creat 11/10/2021 1.32 (H)  0.70 - 1.30 mg/dL Final   eGFR 11/10/2021 62  > OR = 60 mL/min/1.68m Final   BUN/Creatinine Ratio 11/10/2021 11  6 - 22 (calc) Final   Sodium 11/10/2021 140  135 - 146 mmol/L Final   Potassium 11/10/2021 4.0  3.5 - 5.3 mmol/L Final   Chloride 11/10/2021 107  98 - 110 mmol/L Final   CO2 11/10/2021 27  20 - 32 mmol/L Final   Calcium 11/10/2021 9.5  8.6 - 10.3 mg/dL Final   Total Protein 11/10/2021 7.9  6.1 - 8.1 g/dL Final   Albumin 11/10/2021 4.6  3.6 - 5.1 g/dL Final   Globulin 11/10/2021 3.3  1.9 - 3.7 g/dL (calc) Final  AG Ratio 11/10/2021 1.4  1.0 - 2.5 (calc) Final   Total Bilirubin 11/10/2021 0.8  0.2 - 1.2 mg/dL Final   Alkaline phosphatase (APISO) 11/10/2021 68  35 - 144 U/L Final   AST 11/10/2021 19  10 - 35 U/L Final   ALT 11/10/2021 20  9 - 46 U/L Final   WBC 11/10/2021 7.1  3.8 - 10.8 Thousand/uL Final   RBC 11/10/2021 4.71  4.20 - 5.80 Million/uL Final   Hemoglobin 11/10/2021 15.3  13.2 - 17.1 g/dL Final   HCT 11/10/2021 44.2  38.5 - 50.0 % Final   MCV 11/10/2021 93.8  80.0 - 100.0 fL Final   MCH 11/10/2021 32.5  27.0 - 33.0 pg Final   MCHC 11/10/2021 34.6  32.0 - 36.0 g/dL Final   RDW 11/10/2021 13.0  11.0 - 15.0 % Final   Platelets 11/10/2021 297  140 - 400 Thousand/uL Final   MPV 11/10/2021 10.6  7.5 - 12.5 fL Final   Neutro Abs 11/10/2021 4,367  1,500 - 7,800 cells/uL Final   Lymphs Abs 11/10/2021 1,924  850 - 3,900 cells/uL Final   Absolute Monocytes 11/10/2021 490  200 -  950 cells/uL Final   Eosinophils Absolute 11/10/2021 263  15 - 500 cells/uL Final   Basophils Absolute 11/10/2021 57  0 - 200 cells/uL Final   Neutrophils Relative % 11/10/2021 61.5  % Final   Total Lymphocyte 11/10/2021 27.1  % Final   Monocytes Relative 11/10/2021 6.9  % Final   Eosinophils Relative 11/10/2021 3.7  % Final   Basophils Relative 11/10/2021 0.8  % Final   CD4 T Cell Abs 11/10/2021 274 (L)  400 - 1,790 /uL Final   CD4 % Helper T Cell 11/10/2021 15 (L)  33 - 65 % Final   Performed at [facility], [place] [address] [place], [place] [address]   Cholesterol 11/10/2021 151  <200 mg/dL Final   HDL 11/10/2021 47  > OR = 40 mg/dL Final   Triglycerides 11/10/2021 92  <150 mg/dL Final   LDL Cholesterol (Calc) 11/10/2021 85  mg/dL (calc) Final   Comment: Reference range: <100 . Desirable range <100 mg/dL for primary prevention;   <70 mg/dL for patients with CHD or diabetic patients  with > or = 2 CHD risk factors. Marland Kitchen LDL-C is now calculated using the Martin-Hopkins  calculation, which is a validated novel method providing  better accuracy than the Friedewald equation in the  estimation of LDL-C.  Cresenciano Genre et al. Annamaria Helling. [id number]): 2061-2068  (http://education.[employer].com/faq/FAQ164)    Total CHOL/HDL Ratio 11/10/2021 3.2  <5.0 (calc) Final   Non-HDL Cholesterol (Calc) 11/10/2021 104  <130 mg/dL (calc) Final   Comment: For patients with diabetes plus 1 major ASCVD risk  factor, treating to a non-HDL-C goal of <100 mg/dL  (LDL-C of <70 mg/dL) is considered a therapeutic  option.    Neisseria Gonorrhea 11/10/2021 Negative   Final   Chlamydia 11/10/2021 Negative   Final   Comment 11/10/2021 Normal Reference Ranger Chlamydia - Negative   Final   Comment 11/10/2021 Normal Reference Range Neisseria Gonorrhea - Negative   Final   (tTG) Ab, IgA 11/10/2021 <1.0  U/mL Final   Comment: Value          Interpretation -----          -------------- <15.0           Antibody not detected > or = 15.0    Antibody detected .  Blood Alcohol level:  Lab Results  Component Value Date   ETH <10 [id number]    Metabolic Disorder Labs: Lab Results  Component Value Date   HGBA1C 5.2 05/05/2022   MPG 102.54 05/05/2022   No results found for: "PROLACTIN" Lab Results  Component Value Date   CHOL 125 05/05/2022   TRIG 150 (H) 05/05/2022   HDL 49 05/05/2022   CHOLHDL 2.6 05/05/2022   VLDL 30 05/05/2022   LDLCALC 46 05/05/2022   LDLCALC 85 11/10/2021    Therapeutic Lab Levels: No results found for: "LITHIUM" No results found for: "VALPROATE" No results found for: "CBMZ"  Physical Findings   PHQ2-9    [facility] ED from 05/05/2022 in [facility] Office Visit from 11/10/2021 in [facility]  PHQ-2 Total Score 5 0  PHQ-9 Total Score 22 --      [place] ED from 05/05/2022 in [facility] Most recent reading at 05/05/2022 10:03 PM ED from 05/04/2022 in [facility] Emergency Department at [facility] Most recent reading at 05/04/2022  6:44 PM ED from 05/04/2022 in [facility] [place] Most recent reading at 05/04/2022 11:27 AM  C-SSRS RISK CATEGORY No Risk No Risk No Risk        Musculoskeletal  Strength & Muscle Tone: within normal limits Gait & Station: normal Patient leans: N/A  Psychiatric Specialty Exam  Presentation  General Appearance:  Appropriate for Environment; Casual  Eye Contact: Good  Speech: Clear and Coherent; Normal Rate  Speech Volume: Normal  Handedness: Right   Mood and Affect  Mood: Euthymic  Affect: Congruent; Appropriate   Thought Process  Thought Processes: Coherent; Goal Directed  Descriptions of Associations:Intact  Orientation:Full (Time, Place and Person)  Thought Content:WDL; Logical  Diagnosis of Schizophrenia or Schizoaffective disorder in past: No     Hallucinations:Hallucinations: None  Ideas of Reference:None  Suicidal Thoughts:Suicidal Thoughts: No  Homicidal Thoughts:Homicidal Thoughts: No   Sensorium  Memory: Immediate Good; Recent Good  Judgment: Good  Insight: Good   Executive Functions  Concentration: Good  Attention Span: Good  Recall: Good  Fund of Knowledge: Good  Language: Good   Psychomotor Activity  Psychomotor Activity:Psychomotor Activity: Normal   Assets  Assets: Communication Skills; Desire for Improvement; Leisure Time; Physical Health; Resilience; Social Support   Sleep  Sleep:Sleep: Fair   No data recorded  Physical Exam  Physical Exam Vitals and nursing note reviewed.  Constitutional:      General: He is not in acute distress.    Appearance: Normal appearance. He is normal weight. He is not ill-appearing or toxic-appearing.  HENT:     Head: Normocephalic and atraumatic.  Pulmonary:     Effort: Pulmonary effort is normal.  Musculoskeletal:        General: Normal range of motion.  Neurological:     General: No focal deficit present.     Mental Status: He is alert.    Review of Systems  Constitutional:  Positive for diaphoresis and malaise/fatigue.  Respiratory:  Negative for cough and shortness of breath.   Cardiovascular:  Negative for chest pain.  Gastrointestinal:  Negative for abdominal pain, constipation, diarrhea, nausea and vomiting.  Musculoskeletal:  Positive for back pain.  Neurological:  Negative for dizziness, weakness and headaches.  Psychiatric/Behavioral:  Negative for depression, hallucinations and suicidal ideas. The patient is not nervous/anxious.    Blood pressure 136/85, pulse (!) 58, temperature 97.7 F (36.5  C), temperature source Tympanic, resp. rate 18, SpO2 100 %. There is no height or weight on file to calculate BMI.  Treatment Plan Summary: Daily contact with patient to assess and evaluate symptoms and progress in treatment and  Medication management  Mike Harrison is a 62 yr old male who presented to [facility] on 2/8 requesting to detox from Cocaine, he was admitted to [facility] on 2/8.  PPHx is significant for MDD and Cocaine Abuse, and no history of Suicide Attempt or Self Injurious Behavior.   [place] is tolerating withdrawal well.  He tolerated the increase in his Remeron last night without issue.  He is increasing his area of searching for Sober Living facilities.  We will not make any changes to his medications at this time.  We will continue to monitor.   MDD -Continue Remeron 30 mg QHS for depression and anxiety.   HIV: -Continue Biktarvy 50-200-25 mg daily   Asthma: -Albuterol PRN   Dyslipidemia: -Continue Liptor 20 mg daily    Ear Infection: -Continue Augmentin 875-125 mg q12h (to complete 2/13)   -Continue Diclofenac EC 75 mg BID -Continue Tessalon 100 mg BID PRN -Continue PRN's: Tylenol, Maalox, Atarax, Milk of Magnesia, Trazodone   Dispo: Sober Living   Briant Cedar, MD 05/07/2022 1:15 PM

## 2022-05-07 NOTE — ED Notes (Signed)
Patient is awake and alert visible on unit.  He is calm and pleasant without complaint or distress.  Will continue to monitor and encourage to make needs known.

## 2022-05-07 NOTE — ED Notes (Signed)
Pt asleep in bed. Respirations even and unlabored. Monitoring for safety. 

## 2022-05-07 NOTE — ED Notes (Signed)
Pt resting in bed. A&O x4, calm and cooperative. Denies current SI/HI/AVH. No signs of distress noted. Monitoring for safety. 

## 2022-05-08 ENCOUNTER — Encounter (HOSPITAL_COMMUNITY): Payer: Self-pay | Admitting: Family

## 2022-05-08 DIAGNOSIS — F17210 Nicotine dependence, cigarettes, uncomplicated: Secondary | ICD-10-CM | POA: Diagnosis not present

## 2022-05-08 DIAGNOSIS — F1290 Cannabis use, unspecified, uncomplicated: Secondary | ICD-10-CM | POA: Diagnosis not present

## 2022-05-08 DIAGNOSIS — F1414 Cocaine abuse with cocaine-induced mood disorder: Secondary | ICD-10-CM | POA: Diagnosis not present

## 2022-05-08 DIAGNOSIS — Z1152 Encounter for screening for COVID-19: Secondary | ICD-10-CM | POA: Diagnosis not present

## 2022-05-08 DIAGNOSIS — Z5900 Homelessness unspecified: Secondary | ICD-10-CM | POA: Diagnosis not present

## 2022-05-08 DIAGNOSIS — R69 Illness, unspecified: Secondary | ICD-10-CM | POA: Diagnosis not present

## 2022-05-08 DIAGNOSIS — F1410 Cocaine abuse, uncomplicated: Secondary | ICD-10-CM | POA: Diagnosis not present

## 2022-05-08 DIAGNOSIS — Z716 Tobacco abuse counseling: Secondary | ICD-10-CM | POA: Diagnosis not present

## 2022-05-08 DIAGNOSIS — F1590 Other stimulant use, unspecified, uncomplicated: Secondary | ICD-10-CM | POA: Diagnosis not present

## 2022-05-08 DIAGNOSIS — Z79899 Other long term (current) drug therapy: Secondary | ICD-10-CM | POA: Diagnosis not present

## 2022-05-08 LAB — PROLACTIN: Prolactin: 11.4 ng/mL (ref 3.6–25.2)

## 2022-05-08 MED ORDER — MELATONIN 3 MG PO TABS
3.0000 mg | ORAL_TABLET | Freq: Every day | ORAL | Status: DC
  Administered 2022-05-08 – 2022-05-09 (×2): 3 mg via ORAL
  Filled 2022-05-08 (×2): qty 1

## 2022-05-08 NOTE — ED Notes (Signed)
Patient was awake and alert for breakfast.  He is without distress or complaint.  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  Encouraged him to make needs known.  Will monitor and provide support as needed.

## 2022-05-08 NOTE — ED Notes (Signed)
Pt is in his room resting in bed. Pt denies SI/HI/AVH. No acute distress noted. Will continue to monitor for safety.

## 2022-05-08 NOTE — ED Notes (Signed)
Pt asleep in bed. Respirations even and unlabored. Monitoring for safety. 

## 2022-05-08 NOTE — ED Provider Notes (Signed)
Behavioral Health Progress Note  Date and Time: 05/08/2022 12:35 PM Name: Mike Harrison MRN:  [id number]  Subjective:   Mike Harrison is a 62 yr old male who presented to [facility] on 2/8 requesting to detox from Cocaine, he was admitted to [facility] on 2/8.  PPHx is significant for MDD and Cocaine Abuse, and no history of Suicide Attempt or Self Injurious Behavior.  He reports that he is doing okay today.  He reports having sweats and low back pain but no other withdrawal symptoms.  He reports no cravings.  He reports continued to have some issues getting to sleep.  When asked if he had tried the trazodone he reports he has not because he does not want to be given something he can be addicted to given his addictive personality.  Discussed melatonin with him which the brain naturally produces to help regulate the sleep cycle and supplementing it can often help with sleep.  He was agreeable to the trial.  He reports he has some pain on the right side of his face today and reports that he was diagnosed with Bell's palsy and supposed to be on prednisone taper.  Discussed records will be reviewed and he reported understanding.  He reports no SI, HI, or AVH.  He reports sleep is fair.  He reports appetite is good.  He reports no other concerns at present.  Diagnosis:  Final diagnoses:  Cocaine use disorder ([place])    Total Time spent with patient: 30 minutes  Past Psychiatric History: MDD and Cocaine Abuse, and no history of Suicide Attempt or Self Injurious Behavior. Past Medical History: HIV, Hypertension, hyperlipidemia, arthritis, asthma, pruritus  Family History:  None reported  Family Psychiatric  History:  None reported  Social History: Currently homeless, requested leave of absence to seek substance use treatment from employer   Additional Social History:    Pain Medications: See MAR Prescriptions: See MAR Over the Counter: See MAR History of alcohol / drug use?: Yes Longest period of sobriety  (when/how long): 11.5 years. Negative Consequences of Use: Financial, Work / Youth worker, Scientist, research (physical sciences), Personal relationships Withdrawal Symptoms: Cramps (Feeling cold.) Name of Substance 1: Crack Cocaine. 1 - Age of First Use: 39. 1 - Amount (size/oz): Pt reports, using a few $100's on Sunday. 1 - Frequency: Pt reports, his use has increased over the past three months. 1 - Duration: Ongoing. 1 - Last Use / Amount: Per pt, Sunday. 1 - Method of Aquiring: Purchase. 1- Route of Use: Smoke. Name of Substance 2: Methamphetamines. 2 - Age of First Use: 20. 2 - Amount (size/oz): Pt reports, "not much," on Sunday. 2 - Frequency: Pt reports, he used 2-3 times. 2 - Duration: Ongoing. 2 - Last Use / Amount: Sunday. 2 - Method of Aquiring: Purchase. 2 - Route of Substance Use: Smoke. Name of Substance 3: Marijuana. 3 - Age of First Use: [age]. 3 - Amount (size/oz): Pt reports, smoking a few joints daily. 3 - Frequency: Pt reports, he just starts smoking Marijuana daily. 3 - Duration: Ongoing. 3 - Last Use / Amount: Per pt, Sunday. 3 - Method of Aquiring: Purchase. 3 - Route of Substance Use: Smoke.              Sleep: Fair   Appetite:  Good  Current Medications:  Current Facility-Administered Medications  Medication Dose Route Frequency Provider Last Rate Last Admin   acetaminophen (TYLENOL) tablet 650 mg  650 mg Oral Q6H PRN Lucky Rathke, FNP  albuterol (VENTOLIN HFA) 108 (90 Base) MCG/ACT inhaler 2 puff  2 puff Inhalation Q2H PRN Lucky Rathke, FNP       alum & mag hydroxide-simeth (MAALOX/MYLANTA) 200-200-20 MG/5ML suspension 30 mL  30 mL Oral Q4H PRN Lucky Rathke, FNP       amoxicillin-clavulanate (AUGMENTIN) 875-125 MG per tablet 1 tablet  1 tablet Oral Q12H Lucky Rathke, FNP   1 tablet at 05/08/22 0946   atorvastatin (LIPITOR) tablet 20 mg  20 mg Oral Daily Hampton Abbot, MD   20 mg at 05/08/22 0946   benzonatate (TESSALON) capsule 100 mg  100 mg Oral BID PRN Lucky Rathke, FNP    100 mg at 05/05/22 2103   bictegravir-emtricitabine-tenofovir AF (BIKTARVY) 50-200-25 MG per tablet 1 tablet  1 tablet Oral Daily Lucky Rathke, FNP   1 tablet at 05/08/22 0946   diclofenac (VOLTAREN) EC tablet 75 mg  75 mg Oral BID Lucky Rathke, FNP   75 mg at 05/08/22 0946   hydrOXYzine (ATARAX) tablet 25 mg  25 mg Oral TID PRN Lucky Rathke, FNP   25 mg at 05/05/22 2101   magnesium hydroxide (MILK OF MAGNESIA) suspension 30 mL  30 mL Oral Daily PRN Lucky Rathke, FNP       melatonin tablet 3 mg  3 mg Oral QHS Briant Cedar, MD       mirtazapine (REMERON) tablet 30 mg  30 mg Oral Aliene Altes, MD   30 mg at 05/07/22 2118   traZODone (DESYREL) tablet 50 mg  50 mg Oral QHS PRN Lucky Rathke, FNP       Current Outpatient Medications  Medication Sig Dispense Refill   amoxicillin-clavulanate (AUGMENTIN) 875-125 MG tablet Take 1 tablet by mouth every 12 (twelve) hours. (Patient taking differently: Take 1 tablet by mouth every 12 (twelve) hours. Take for 7 days starting on 05/04/22.) 14 tablet 0   bictegravir-emtricitabine-tenofovir AF (BIKTARVY) 50-200-25 MG TABS tablet Take 1 tablet by mouth daily. 30 tablet 11   mirtazapine (REMERON) 15 MG tablet Take 1 tablet (15 mg total) by mouth at bedtime. 30 tablet 5   Multiple Vitamins-Iron (MULTIVITAMIN/IRON PO) Take 1 tablet by mouth daily.     Pitavastatin Magnesium 4 MG TABS Take 1 tablet by mouth daily. 30 tablet 11   Vitamin D, Cholecalciferol, 25 MCG (1000 UT) TABS Take 2,000 Units by mouth every other day.      Labs  Lab Results:  Admission on 05/05/2022  Component Date Value Ref Range Status   SARS Coronavirus 2 by RT PCR 05/05/2022 NEGATIVE  NEGATIVE Final   Influenza A by PCR 05/05/2022 NEGATIVE  NEGATIVE Final   Influenza B by PCR 05/05/2022 NEGATIVE  NEGATIVE Final   Comment: (NOTE) The Xpert Xpress SARS-CoV-2/FLU/RSV plus assay is intended as an aid in the diagnosis of influenza from Nasopharyngeal swab specimens and should  not be used as a sole basis for treatment. Nasal washings and aspirates are unacceptable for Xpert Xpress SARS-CoV-2/FLU/RSV testing.  Fact Sheet for Patients: EntrepreneurPulse.com.au  Fact Sheet for Healthcare Providers: IncredibleEmployment.be  This test is not yet approved or cleared by the [place] FDA and has been authorized for detection and/or diagnosis of SARS-CoV-2 by FDA under an Emergency Use Authorization (EUA). This EUA will remain in effect (meaning this test can be used) for the duration of the COVID-19 declaration under Section 564(b)(1) of the Act, 21 U.S.C. section 360bbb-3(b)(1), unless the authorization is terminated or  revoked.     Resp Syncytial Virus by PCR 05/05/2022 NEGATIVE  NEGATIVE Final   Comment: (NOTE) Fact Sheet for Patients: EntrepreneurPulse.com.au  Fact Sheet for Healthcare Providers: IncredibleEmployment.be  This test is not yet approved or cleared by the [place] FDA and has been authorized for detection and/or diagnosis of SARS-CoV-2 by FDA under an Emergency Use Authorization (EUA). This EUA will remain in effect (meaning this test can be used) for the duration of the COVID-19 declaration under Section 564(b)(1) of the Act, 21 U.S.C. section 360bbb-3(b)(1), unless the authorization is terminated or revoked.  Performed at [facility], [place] [address] [place], [place] [address]    WBC 05/05/2022 13.8 (H)  4.0 - 10.5 K/uL Final   RBC 05/05/2022 4.55  4.22 - 5.81 MIL/uL Final   Hemoglobin 05/05/2022 14.9  13.0 - 17.0 g/dL Final   HCT 05/05/2022 43.5  39.0 - 52.0 % Final   MCV 05/05/2022 95.6  80.0 - 100.0 fL Final   MCH 05/05/2022 32.7  26.0 - 34.0 pg Final   MCHC 05/05/2022 34.3  30.0 - 36.0 g/dL Final   RDW 05/05/2022 13.1  11.5 - 15.5 % Final   Platelets 05/05/2022 265  150 - 400 K/uL Final   nRBC 05/05/2022 0.0  0.0 - 0.2 % Final   Neutrophils  Relative % 05/05/2022 77  % Final   Neutro Abs 05/05/2022 10.6 (H)  1.7 - 7.7 K/uL Final   Lymphocytes Relative 05/05/2022 14  % Final   Lymphs Abs 05/05/2022 1.9  0.7 - 4.0 K/uL Final   Monocytes Relative 05/05/2022 7  % Final   Monocytes Absolute 05/05/2022 1.0  0.1 - 1.0 K/uL Final   Eosinophils Relative 05/05/2022 1  % Final   Eosinophils Absolute 05/05/2022 0.2  0.0 - 0.5 K/uL Final   Basophils Relative 05/05/2022 1  % Final   Basophils Absolute 05/05/2022 0.1  0.0 - 0.1 K/uL Final   Immature Granulocytes 05/05/2022 0  % Final   Abs Immature Granulocytes 05/05/2022 0.06  0.00 - 0.07 K/uL Final   Performed at [facility], [place] [address], [place], [place] [address]   Sodium 05/05/2022 139  135 - 145 mmol/L Final   Potassium 05/05/2022 4.0  3.5 - 5.1 mmol/L Final   Chloride 05/05/2022 102  98 - 111 mmol/L Final   CO2 05/05/2022 28  22 - 32 mmol/L Final   Glucose, Bld 05/05/2022 102 (H)  70 - 99 mg/dL Final   Glucose reference range applies only to samples taken after fasting for at least 8 hours.   BUN 05/05/2022 11  6 - 20 mg/dL Final   Creatinine, Ser 05/05/2022 1.03  0.61 - 1.24 mg/dL Final   Calcium 05/05/2022 9.1  8.9 - 10.3 mg/dL Final   Total Protein 05/05/2022 6.5  6.5 - 8.1 g/dL Final   Albumin 05/05/2022 3.6  3.5 - 5.0 g/dL Final   AST 05/05/2022 20  15 - 41 U/L Final   ALT 05/05/2022 21  0 - 44 U/L Final   Alkaline Phosphatase 05/05/2022 61  38 - 126 U/L Final   Total Bilirubin 05/05/2022 0.7  0.3 - 1.2 mg/dL Final   GFR, Estimated 05/05/2022 >60  >60 mL/min Final   Comment: (NOTE) Calculated using the CKD-EPI Creatinine Equation (2021)    Anion gap 05/05/2022 9  5 - 15 Final   Performed at [place] [address], [place], [place] [address]   Hgb A1c MFr  Bld 05/05/2022 5.2  4.8 - 5.6 % Final   Comment: (NOTE) Pre diabetes:          5.7%-6.4%  Diabetes:              >6.4%  Glycemic control for   <7.0% adults with diabetes    Mean Plasma  Glucose 05/05/2022 102.54  mg/dL Final   Performed at [facility], [place] [address], [place], [place] [address]   Magnesium 05/05/2022 2.3  1.7 - 2.4 mg/dL Final   Performed at [place] [address] [place], [place] [address]   Alcohol, Ethyl (B) 05/05/2022 <10  <10 mg/dL Final   Comment: (NOTE) Lowest detectable limit for serum alcohol is 10 mg/dL.  For medical purposes only. Performed at [facility], [place] [address], [place], [place] [address]    POC Amphetamine UR 05/05/2022 None Detected  NONE DETECTED (Cut Off Level 1000 ng/mL) Final   POC Secobarbital (BAR) 05/05/2022 None Detected  NONE DETECTED (Cut Off Level 300 ng/mL) Final   POC Buprenorphine (BUP) 05/05/2022 None Detected  NONE DETECTED (Cut Off Level 10 ng/mL) Final   POC Oxazepam (BZO) 05/05/2022 None Detected  NONE DETECTED (Cut Off Level 300 ng/mL) Final   POC Cocaine UR 05/05/2022 None Detected  NONE DETECTED (Cut Off Level 300 ng/mL) Final   POC Methamphetamine UR 05/05/2022 None Detected  NONE DETECTED (Cut Off Level 1000 ng/mL) Final   POC Morphine 05/05/2022 None Detected  NONE DETECTED (Cut Off Level 300 ng/mL) Final   POC Methadone UR 05/05/2022 None Detected  NONE DETECTED (Cut Off Level 300 ng/mL) Final   POC Oxycodone UR 05/05/2022 None Detected  NONE DETECTED (Cut Off Level 100 ng/mL) Final   POC Marijuana UR 05/05/2022 Positive (A)  NONE DETECTED (Cut Off Level 50 ng/mL) Final   Cholesterol 05/05/2022 125  0 - 200 mg/dL Final   Triglycerides 05/05/2022 150 (H)  <150 mg/dL Final   HDL 05/05/2022 49  >40 mg/dL Final   Total CHOL/HDL Ratio 05/05/2022 2.6  RATIO Final   VLDL 05/05/2022 30  0 - 40 mg/dL Final   LDL Cholesterol 05/05/2022 46  0 - 99 mg/dL Final   Comment:        Total Cholesterol/HDL:CHD Risk Coronary Heart Disease Risk Table                     Men   Women  1/2 Average Risk   3.4   3.3  Average Risk       5.0   4.4  2 X Average Risk   9.6   7.1  3 X Average Risk  23.4    11.0        Use the calculated Patient Ratio above and the CHD Risk Table to determine the patient's CHD Risk.        ATP III CLASSIFICATION (LDL):  <100     mg/dL   Optimal  100-129  mg/dL   Near or Above                    Optimal  130-159  mg/dL   Borderline  160-189  mg/dL   High  >190     mg/dL   Very High Performed at [place] [address], [place],  [address]    TSH 05/05/2022 0.665  0.350 - 4.500 uIU/mL Final   Comment: Performed by a 3rd Generation assay with  a functional sensitivity of <=0.01 uIU/mL. Performed at [facility], [place] [address] [place], [place] [address]    SARSCOV2ONAVIRUS 2 AG 05/05/2022 NEGATIVE  NEGATIVE Final   Comment: (NOTE) SARS-CoV-2 antigen NOT DETECTED.   Negative results are presumptive.  Negative results do not preclude SARS-CoV-2 infection and should not be used as the sole basis for treatment or other patient management decisions, including infection  control decisions, particularly in the presence of clinical signs and  symptoms consistent with COVID-19, or in those who have been in contact with the virus.  Negative results must be combined with clinical observations, patient history, and epidemiological information. The expected result is Negative.  Fact Sheet for Patients: HandmadeRecipes.com.cy  Fact Sheet for Healthcare Providers: FuneralLife.at  This test is not yet approved or cleared by the [place] FDA and  has been authorized for detection and/or diagnosis of SARS-CoV-2 by FDA under an Emergency Use Authorization (EUA).  This EUA will remain in effect (meaning this test can be used) for the duration of  the COV                          ID-19 declaration under Section 564(b)(1) of the Act, 21 U.S.C. section 360bbb-3(b)(1), unless the authorization is terminated or revoked sooner.    Admission on 05/04/2022, Discharged on 05/04/2022  Component  Date Value Ref Range Status   SARS Coronavirus 2 by RT PCR 05/04/2022 NEGATIVE  NEGATIVE Final   Influenza A by PCR 05/04/2022 NEGATIVE  NEGATIVE Final   Influenza B by PCR 05/04/2022 NEGATIVE  NEGATIVE Final   Comment: (NOTE) The Xpert Xpress SARS-CoV-2/FLU/RSV plus assay is intended as an aid in the diagnosis of influenza from Nasopharyngeal swab specimens and should not be used as a sole basis for treatment. Nasal washings and aspirates are unacceptable for Xpert Xpress SARS-CoV-2/FLU/RSV testing.  Fact Sheet for Patients: EntrepreneurPulse.com.au  Fact Sheet for Healthcare Providers: IncredibleEmployment.be  This test is not yet approved or cleared by the [place] FDA and has been authorized for detection and/or diagnosis of SARS-CoV-2 by FDA under an Emergency Use Authorization (EUA). This EUA will remain in effect (meaning this test can be used) for the duration of the COVID-19 declaration under Section 564(b)(1) of the Act, 21 U.S.C. section 360bbb-3(b)(1), unless the authorization is terminated or revoked.     Resp Syncytial Virus by PCR 05/04/2022 NEGATIVE  NEGATIVE Final   Comment: (NOTE) Fact Sheet for Patients: EntrepreneurPulse.com.au  Fact Sheet for Healthcare Providers: IncredibleEmployment.be  This test is not yet approved or cleared by the [place] FDA and has been authorized for detection and/or diagnosis of SARS-CoV-2 by FDA under an Emergency Use Authorization (EUA). This EUA will remain in effect (meaning this test can be used) for the duration of the COVID-19 declaration under Section 564(b)(1) of the Act, 21 U.S.C. section 360bbb-3(b)(1), unless the authorization is terminated or revoked.  Performed at [facility], [place] [address], [place], [place] [address]   Admission on 04/14/2022, Discharged on 04/14/2022  Component Date Value Ref Range Status   WBC  04/13/2022 6.4  4.0 - 10.5 K/uL Final   RBC 04/13/2022 4.50  4.22 - 5.81 MIL/uL Final   Hemoglobin 04/13/2022 14.5  13.0 - 17.0 g/dL Final   HCT 04/13/2022 41.0  39.0 - 52.0 % Final   MCV 04/13/2022 91.1  80.0 - 100.0 fL Final   MCH 04/13/2022 32.2  26.0 - 34.0 pg Final  MCHC 04/13/2022 35.4  30.0 - 36.0 g/dL Final   RDW 04/13/2022 12.0  11.5 - 15.5 % Final   Platelets 04/13/2022 321  150 - 400 K/uL Final   nRBC 04/13/2022 0.0  0.0 - 0.2 % Final   Neutrophils Relative % 04/13/2022 58  % Final   Neutro Abs 04/13/2022 3.7  1.7 - 7.7 K/uL Final   Lymphocytes Relative 04/13/2022 28  % Final   Lymphs Abs 04/13/2022 1.8  0.7 - 4.0 K/uL Final   Monocytes Relative 04/13/2022 9  % Final   Monocytes Absolute 04/13/2022 0.6  0.1 - 1.0 K/uL Final   Eosinophils Relative 04/13/2022 4  % Final   Eosinophils Absolute 04/13/2022 0.3  0.0 - 0.5 K/uL Final   Basophils Relative 04/13/2022 1  % Final   Basophils Absolute 04/13/2022 0.1  0.0 - 0.1 K/uL Final   Immature Granulocytes 04/13/2022 0  % Final   Abs Immature Granulocytes 04/13/2022 0.02  0.00 - 0.07 K/uL Final   Performed at [facility], [address] [place], [place] [address]   Sodium 04/13/2022 137  135 - 145 mmol/L Final   Potassium 04/13/2022 3.5  3.5 - 5.1 mmol/L Final   Chloride 04/13/2022 105  98 - 111 mmol/L Final   CO2 04/13/2022 24  22 - 32 mmol/L Final   Glucose, Bld 04/13/2022 105 (H)  70 - 99 mg/dL Final   Glucose reference range applies only to samples taken after fasting for at least 8 hours.   BUN 04/13/2022 8  6 - 20 mg/dL Final   Creatinine, Ser 04/13/2022 1.09  0.61 - 1.24 mg/dL Final   Calcium 04/13/2022 9.1  8.9 - 10.3 mg/dL Final   GFR, Estimated 04/13/2022 >60  >60 mL/min Final   Comment: (NOTE) Calculated using the CKD-EPI Creatinine Equation (2021)    Anion gap 04/13/2022 8  5 - 15 Final   Performed at [facility], [address], [place], [place] [address]  Office Visit  on 11/10/2021  Component Date Value Ref Range Status   HIV 1 RNA Quant 11/10/2021 Not Detected  Copies/mL Final   HIV-1 RNA Quant, Log 11/10/2021 Not Detected  Log cps/mL Final   Comment: . Reference Range:                           Not Detected     copies/mL                           Not Detected Log copies/mL . Marland Kitchen The test was performed using Real-Time Polymerase Chain Reaction. . . Reportable Range: 20 copies/mL to 10,000,000 copies/mL (1.30 Log copies/mL to 7.00 Log copies/mL). .    RPR Ser Ql 11/10/2021 NON-REACTIVE  NON-REACTIVE Final   Glucose, Bld 11/10/2021 96  65 - 99 mg/dL Final   Comment: .            Fasting reference interval .    BUN 11/10/2021 15  7 - 25 mg/dL Final   Creat 11/10/2021 1.32 (H)  0.70 - 1.30 mg/dL Final   eGFR 11/10/2021 62  > OR = 60 mL/min/1.63m Final   BUN/Creatinine Ratio 11/10/2021 11  6 - 22 (calc) Final   Sodium 11/10/2021 140  135 - 146 mmol/L Final   Potassium 11/10/2021 4.0  3.5 - 5.3 mmol/L Final   Chloride 11/10/2021 107  98 - 110 mmol/L Final   CO2  11/10/2021 27  20 - 32 mmol/L Final   Calcium 11/10/2021 9.5  8.6 - 10.3 mg/dL Final   Total Protein 11/10/2021 7.9  6.1 - 8.1 g/dL Final   Albumin 11/10/2021 4.6  3.6 - 5.1 g/dL Final   Globulin 11/10/2021 3.3  1.9 - 3.7 g/dL (calc) Final   AG Ratio 11/10/2021 1.4  1.0 - 2.5 (calc) Final   Total Bilirubin 11/10/2021 0.8  0.2 - 1.2 mg/dL Final   Alkaline phosphatase (APISO) 11/10/2021 68  35 - 144 U/L Final   AST 11/10/2021 19  10 - 35 U/L Final   ALT 11/10/2021 20  9 - 46 U/L Final   WBC 11/10/2021 7.1  3.8 - 10.8 Thousand/uL Final   RBC 11/10/2021 4.71  4.20 - 5.80 Million/uL Final   Hemoglobin 11/10/2021 15.3  13.2 - 17.1 g/dL Final   HCT 11/10/2021 44.2  38.5 - 50.0 % Final   MCV 11/10/2021 93.8  80.0 - 100.0 fL Final   MCH 11/10/2021 32.5  27.0 - 33.0 pg Final   MCHC 11/10/2021 34.6  32.0 - 36.0 g/dL Final   RDW 11/10/2021 13.0  11.0 - 15.0 % Final   Platelets 11/10/2021 297   140 - 400 Thousand/uL Final   MPV 11/10/2021 10.6  7.5 - 12.5 fL Final   Neutro Abs 11/10/2021 4,367  1,500 - 7,800 cells/uL Final   Lymphs Abs 11/10/2021 1,924  850 - 3,900 cells/uL Final   Absolute Monocytes 11/10/2021 490  200 - 950 cells/uL Final   Eosinophils Absolute 11/10/2021 263  15 - 500 cells/uL Final   Basophils Absolute 11/10/2021 57  0 - 200 cells/uL Final   Neutrophils Relative % 11/10/2021 61.5  % Final   Total Lymphocyte 11/10/2021 27.1  % Final   Monocytes Relative 11/10/2021 6.9  % Final   Eosinophils Relative 11/10/2021 3.7  % Final   Basophils Relative 11/10/2021 0.8  % Final   CD4 T Cell Abs 11/10/2021 274 (L)  400 - 1,790 /uL Final   CD4 % Helper T Cell 11/10/2021 15 (L)  33 - 65 % Final   Performed at [facility], [place] [address] [place],  [address]   Cholesterol 11/10/2021 151  <200 mg/dL Final   HDL 11/10/2021 47  > OR = 40 mg/dL Final   Triglycerides 11/10/2021 92  <150 mg/dL Final   LDL Cholesterol (Calc) 11/10/2021 85  mg/dL (calc) Final   Comment: Reference range: <100 . Desirable range <100 mg/dL for primary prevention;   <70 mg/dL for patients with CHD or diabetic patients  with > or = 2 CHD risk factors. Marland Kitchen LDL-C is now calculated using the Martin-Hopkins  calculation, which is a validated novel method providing  better accuracy than the Friedewald equation in the  estimation of LDL-C.  Cresenciano Genre et al. Annamaria Helling. [id number]): 2061-2068  (http://education.[employer].com/faq/FAQ164)    Total CHOL/HDL Ratio 11/10/2021 3.2  <5.0 (calc) Final   Non-HDL Cholesterol (Calc) 11/10/2021 104  <130 mg/dL (calc) Final   Comment: For patients with diabetes plus 1 major ASCVD risk  factor, treating to a non-HDL-C goal of <100 mg/dL  (LDL-C of <70 mg/dL) is considered a therapeutic  option.    Neisseria Gonorrhea 11/10/2021 Negative   Final   Chlamydia 11/10/2021 Negative   Final   Comment 11/10/2021 Normal Reference Ranger  Chlamydia - Negative   Final   Comment 11/10/2021 Normal Reference Range Neisseria Gonorrhea - Negative   Final   (tTG) Ab, IgA 11/10/2021 <  1.0  U/mL Final   Comment: Value          Interpretation -----          -------------- <15.0          Antibody not detected > or = 15.0    Antibody detected .     Blood Alcohol level:  Lab Results  Component Value Date   ETH <10 [id number]    Metabolic Disorder Labs: Lab Results  Component Value Date   HGBA1C 5.2 05/05/2022   MPG 102.54 05/05/2022   No results found for: "PROLACTIN" Lab Results  Component Value Date   CHOL 125 05/05/2022   TRIG 150 (H) 05/05/2022   HDL 49 05/05/2022   CHOLHDL 2.6 05/05/2022   VLDL 30 05/05/2022   LDLCALC 46 05/05/2022   LDLCALC 85 11/10/2021    Therapeutic Lab Levels: No results found for: "LITHIUM" No results found for: "VALPROATE" No results found for: "CBMZ"  Physical Findings   PHQ2-9    [place] ED from 05/05/2022 in [facility] Office Visit from 11/10/2021 in [facility] for Infectious Disease  PHQ-2 Total Score 4 0  PHQ-9 Total Score 13 --      [place] ED from 05/05/2022 in [facility] Most recent reading at 05/05/2022 10:03 PM ED from 05/04/2022 in [facility] Emergency Department [facility] Most recent reading at 05/04/2022  6:44 PM ED from 05/04/2022 in [facility] [place] Most recent reading at 05/04/2022 11:27 AM  C-SSRS RISK CATEGORY No Risk No Risk No Risk        Musculoskeletal  Strength & Muscle Tone: within normal limits Gait & Station: normal Patient leans: N/A  Psychiatric Specialty Exam  Presentation  General Appearance:  Appropriate for Environment; Casual  Eye Contact: Good  Speech: Clear and Coherent; Normal Rate  Speech Volume: Normal  Handedness: Right   Mood and Affect  Mood: Euthymic  Affect: Appropriate; Congruent   Thought Process   Thought Processes: Coherent; Goal Directed  Descriptions of Associations:Intact  Orientation:Full (Time, Place and Person)  Thought Content:WDL; Logical  Diagnosis of Schizophrenia or Schizoaffective disorder in past: No    Hallucinations:Hallucinations: None  Ideas of Reference:None  Suicidal Thoughts:Suicidal Thoughts: No  Homicidal Thoughts:Homicidal Thoughts: No   Sensorium  Memory: Immediate Good; Recent Good  Judgment: Good  Insight: Good   Executive Functions  Concentration: Good  Attention Span: Good  Recall: Good  Fund of Knowledge: Good  Language: Good   Psychomotor Activity  Psychomotor Activity:Psychomotor Activity: Normal   Assets  Assets: Communication Skills; Desire for Improvement; Physical Health; Resilience; Social Support   Sleep  Sleep:Sleep: Fair   No data recorded  Physical Exam  Physical Exam Vitals and nursing note reviewed.  Constitutional:      General: He is not in acute distress.    Appearance: Normal appearance. He is normal weight. He is not ill-appearing or toxic-appearing.  HENT:     Head: Normocephalic and atraumatic.  Pulmonary:     Effort: Pulmonary effort is normal.  Musculoskeletal:        General: Normal range of motion.  Neurological:     General: No focal deficit present.     Mental Status: He is alert.    Review of Systems  Constitutional:  Positive for diaphoresis.  Respiratory:  Negative for cough and shortness of breath.   Cardiovascular:  Negative for chest pain.  Gastrointestinal:  Negative  for abdominal pain, constipation, diarrhea, nausea and vomiting.  Musculoskeletal:  Positive for back pain.  Neurological:  Negative for dizziness, weakness and headaches.  Psychiatric/Behavioral:  Negative for depression, hallucinations and suicidal ideas. The patient is not nervous/anxious.    Blood pressure (!) 140/89, pulse (!) 57, temperature 98.7 F (37.1 C), temperature source Oral, resp.  rate 18, SpO2 100 %. There is no height or weight on file to calculate BMI.  Treatment Plan Summary: Daily contact with patient to assess and evaluate symptoms and progress in treatment and Medication management  Petros Raia is a 62 yr old male who presented to [facility] on 2/8 requesting to detox from Cocaine, he was admitted to [facility] on 2/8.  PPHx is significant for MDD and Cocaine Abuse, and no history of Suicide Attempt or Self Injurious Behavior.   Lela is tolerating detox with mild withdrawal symptoms.  He continues to have issues with sleep and since he is hesitant to trial the trazodone we will trial melatonin tonight.  On chart review he was seen at [facility] ED on 1/18 and prescribed prednisone 60 mg for 7 days so the course would have been long completed by now.  As this is the first day with any complaint we will not start any prednisone at this time, however, if he continues into tomorrow may need to consider.  We will not make any other changes to his medications at this time.  We will continue to monitor.   MDD -Continue Remeron 30 mg QHS for depression and anxiety.   HIV: -Continue Biktarvy 50-200-25 mg daily   Insomnia: -Start Melatonin 3 mg QHS   Asthma: -Albuterol PRN   Dyslipidemia: -Continue Liptor 20 mg daily    Ear Infection: -Continue Augmentin 875-125 mg q12h (to complete 2/13)   -Continue Diclofenac EC 75 mg BID -Continue Tessalon 100 mg BID PRN -Continue PRN's: Tylenol, Maalox, Atarax, Milk of Magnesia, Trazodone   Dispo: Sober Living   Briant Cedar, MD 05/08/2022 12:35 PM

## 2022-05-09 DIAGNOSIS — R69 Illness, unspecified: Secondary | ICD-10-CM | POA: Diagnosis not present

## 2022-05-09 DIAGNOSIS — F17210 Nicotine dependence, cigarettes, uncomplicated: Secondary | ICD-10-CM | POA: Diagnosis not present

## 2022-05-09 DIAGNOSIS — F1410 Cocaine abuse, uncomplicated: Secondary | ICD-10-CM | POA: Diagnosis not present

## 2022-05-09 DIAGNOSIS — Z1152 Encounter for screening for COVID-19: Secondary | ICD-10-CM | POA: Diagnosis not present

## 2022-05-09 DIAGNOSIS — Z79899 Other long term (current) drug therapy: Secondary | ICD-10-CM | POA: Diagnosis not present

## 2022-05-09 DIAGNOSIS — F1590 Other stimulant use, unspecified, uncomplicated: Secondary | ICD-10-CM | POA: Diagnosis not present

## 2022-05-09 DIAGNOSIS — Z716 Tobacco abuse counseling: Secondary | ICD-10-CM | POA: Diagnosis not present

## 2022-05-09 DIAGNOSIS — F1414 Cocaine abuse with cocaine-induced mood disorder: Secondary | ICD-10-CM | POA: Diagnosis not present

## 2022-05-09 DIAGNOSIS — Z5900 Homelessness unspecified: Secondary | ICD-10-CM | POA: Diagnosis not present

## 2022-05-09 DIAGNOSIS — F1290 Cannabis use, unspecified, uncomplicated: Secondary | ICD-10-CM | POA: Diagnosis not present

## 2022-05-09 MED ORDER — MIRTAZAPINE 15 MG PO TABS: 30.0000 mg | ORAL_TABLET | Freq: Every day | ORAL | Status: DC

## 2022-05-09 NOTE — Discharge Instructions (Addendum)
List of Residential placements:   [facility]: [address] [place], [place] [address]; no fee or insurance required; minimum of 2 years; Highly structured; work based; Intake Coordinator is Gerald Stabs [phone]  [facility] in [place], [place]: [phone]; Fax number is [phone]; website: [email or web address]; Requires 3-6 page autobiography; 2 year program (18 months and then 43monthtransitional housing); Admission fee is $300; no insurance needed; work pFacilities managerin [place] F[place]Staff: RMichel [phone] They have a Men's Regenerations Program 6-933month Free program; There is an initial $300 fee however, they are willing to work with patients regarding that. Application is online.  [facility] [phone] [phone] have private insurance or Medicare; 28 day PHP with transitional housing as needed; Housing is $10 a day; groups daily;   [facility] in [place] [phone] number is [phone] Private insurance; Medicare; Medicare Advantage; some Ambetter insurance; Medicare part B is charged and the patient is responsible for $485.00 a month for food and housing ($16 dollars a day). Will provide bus ticket to get to facility; however patient will have to reimburse for transport; Campus style set up.    [phone] phone  New Patient Assessment/Therapy Walk-Ins:  Monday and Wednesday: 8 am until slots are full. Every 1st and 2nd Fridays of the month: 1 pm - 5 pm.  NO ASSESSMENT/THERAPY WALK-INS ON TUFlemingtonNew Patient Assessment/Medication Management Walk-Ins:  Monday - Friday:  8 am - 11 am.  For all walk-ins, we ask that you arrive by 7:30 am because patients will be seen in the order of arrival.  Availability is  limited; therefore, you may not be seen on the same day that you walk-in.  Our goal is to serve and meet the needs of our community to the best of our ability.  SUBSTANCE USE TREATMENT for Medicaid and State Funded/IPRS  [facility]) 11[phone] phone NOTE: [facility] is no longer offering IOP services.  Serves those who are low-income or have no insurance.  [facility] [phone] [phone] NOTE: Does have Substance Abuse-Intensive Outpatient Program ([facility] well as transitional housing if eligible.  RHFrie[phone] phone [phone]  [phone]. [address] HiCanadian [phone] phone [phone]  HALFWAY HOUSES:  Friends of [phone] Corporationxfordvacancies.com  Alcohol and Drug Council of [place]: assistance with finding placement [phone] Children'S Hospitalor : [phone]: alFindScifi.com.ee12 STEP PROGRAMS:  Alcoholics Anonymous of [place] htReportZoo.com.cyNarcotics Anonymous of [place] htGreenScrapbooking.dkAl-Anon of GrRite AidNCAlaskaww.[email or web address]/find-meetings.html  Nar-Anon https://nar-anon.org/find-[email or web address]-meetin
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
## 2022-05-09 NOTE — ED Notes (Signed)
Pt sitting in dining room watching TV. A&O x4, calm and cooperative. Denies current SI/HI/AVH. No signs of distress noted. Monitoring for safety.

## 2022-05-09 NOTE — ED Provider Notes (Signed)
FBC Progress Note  Date and Time: 05/09/2022 9:14 AM Name: Mike Harrison MRN:  [id number]  Reason for Admission:   Tauheed Gmerek is a 62 yr old male who presented to [facility] on 2/8 requesting to detox from Cocaine, he was admitted to [facility] on 2/8.  PPHx is significant for MDD and Cocaine Abuse, and no history of Suicide Attempt or Self Injurious Behavior.  Subjective He reports that he is doing okay today. Denies SI/HI/AVH. Eating appropriately. He is still having sleep disturbances but is not affecting daytime alertness. He reports appetite is good.  He reports no other concerns at present. He reports interest in sober living of [facility] in [place]. He plans to discharge tomorrow to court date, gather his belongings, then go to sober living community.  Diagnosis:  Final diagnoses:  Cocaine use disorder ([place])    Total Time spent with patient: 30 minutes  Past Psychiatric History: MDD and Cocaine Abuse, and no history of Suicide Attempt or Self Injurious Behavior. Past Medical History: HIV, Hypertension, hyperlipidemia, arthritis, asthma, pruritus  Family History:  None reported  Family Psychiatric  History:  None reported  Social History: Currently homeless, requested leave of absence to seek substance use treatment from employer   Additional Social History:    Pain Medications: See MAR Prescriptions: See MAR Over the Counter: See MAR History of alcohol / drug use?: Yes Longest period of sobriety (when/how long): 11.5 years. Negative Consequences of Use: Financial, Work / Youth worker, Scientist, research (physical sciences), Personal relationships Withdrawal Symptoms: Cramps (Feeling cold.) Name of Substance 1: Crack Cocaine. 1 - Age of First Use: 39. 1 - Amount (size/oz): Pt reports, using a few $100's on Sunday. 1 - Frequency: Pt reports, his use has increased over the past three months. 1 - Duration: Ongoing. 1 - Last Use / Amount: Per pt, Sunday. 1 - Method of Aquiring: Purchase. 1- Route of Use: Smoke. Name of  Substance 2: Methamphetamines. 2 - Age of First Use: 14. 2 - Amount (size/oz): Pt reports, "not much," on Sunday. 2 - Frequency: Pt reports, he used 2-3 times. 2 - Duration: Ongoing. 2 - Last Use / Amount: Sunday. 2 - Method of Aquiring: Purchase. 2 - Route of Substance Use: Smoke. Name of Substance 3: Marijuana. 3 - Age of First Use: [age]. 3 - Amount (size/oz): Pt reports, smoking a few joints daily. 3 - Frequency: Pt reports, he just starts smoking Marijuana daily. 3 - Duration: Ongoing. 3 - Last Use / Amount: Per pt, Sunday. 3 - Method of Aquiring: Purchase. 3 - Route of Substance Use: Smoke.              Sleep: Fair   Appetite:  Good  Current Medications:  Current Facility-Administered Medications  Medication Dose Route Frequency Provider Last Rate Last Admin   acetaminophen (TYLENOL) tablet 650 mg  650 mg Oral Q6H PRN Lucky Rathke, FNP       albuterol (VENTOLIN HFA) 108 (90 Base) MCG/ACT inhaler 2 puff  2 puff Inhalation Q2H PRN Lucky Rathke, FNP       alum & mag hydroxide-simeth (MAALOX/MYLANTA) 200-200-20 MG/5ML suspension 30 mL  30 mL Oral Q4H PRN Lucky Rathke, FNP       amoxicillin-clavulanate (AUGMENTIN) 875-125 MG per tablet 1 tablet  1 tablet Oral Q12H Lucky Rathke, FNP   1 tablet at 05/08/22 2131   atorvastatin (LIPITOR) tablet 20 mg  20 mg Oral Daily Hampton Abbot, MD   20 mg at 05/08/22 [phone]  benzonatate (TESSALON) capsule 100 mg  100 mg Oral BID PRN Lucky Rathke, FNP   100 mg at 05/05/22 2103   bictegravir-emtricitabine-tenofovir AF (BIKTARVY) 50-200-25 MG per tablet 1 tablet  1 tablet Oral Daily Lucky Rathke, FNP   1 tablet at 05/08/22 0946   diclofenac (VOLTAREN) EC tablet 75 mg  75 mg Oral BID Lucky Rathke, FNP   75 mg at 05/08/22 2131   hydrOXYzine (ATARAX) tablet 25 mg  25 mg Oral TID PRN Lucky Rathke, FNP   25 mg at 05/05/22 2101   magnesium hydroxide (MILK OF MAGNESIA) suspension 30 mL  30 mL Oral Daily PRN Lucky Rathke, FNP       melatonin  tablet 3 mg  3 mg Oral QHS Briant Cedar, MD   3 mg at 05/08/22 2131   mirtazapine (REMERON) tablet 30 mg  30 mg Oral Aliene Altes, MD   30 mg at 05/08/22 2131   traZODone (DESYREL) tablet 50 mg  50 mg Oral QHS PRN Lucky Rathke, FNP       Current Outpatient Medications  Medication Sig Dispense Refill   amoxicillin-clavulanate (AUGMENTIN) 875-125 MG tablet Take 1 tablet by mouth every 12 (twelve) hours. (Patient taking differently: Take 1 tablet by mouth every 12 (twelve) hours. Take for 7 days starting on 05/04/22.) 14 tablet 0   bictegravir-emtricitabine-tenofovir AF (BIKTARVY) 50-200-25 MG TABS tablet Take 1 tablet by mouth daily. 30 tablet 11   mirtazapine (REMERON) 15 MG tablet Take 1 tablet (15 mg total) by mouth at bedtime. 30 tablet 5   Multiple Vitamins-Iron (MULTIVITAMIN/IRON PO) Take 1 tablet by mouth daily.     Pitavastatin Magnesium 4 MG TABS Take 1 tablet by mouth daily. 30 tablet 11   Vitamin D, Cholecalciferol, 25 MCG (1000 UT) TABS Take 2,000 Units by mouth every other day.      Labs  Lab Results:  Admission on 05/05/2022  Component Date Value Ref Range Status   SARS Coronavirus 2 by RT PCR 05/05/2022 NEGATIVE  NEGATIVE Final   Influenza A by PCR 05/05/2022 NEGATIVE  NEGATIVE Final   Influenza B by PCR 05/05/2022 NEGATIVE  NEGATIVE Final   Comment: (NOTE) The Xpert Xpress SARS-CoV-2/FLU/RSV plus assay is intended as an aid in the diagnosis of influenza from Nasopharyngeal swab specimens and should not be used as a sole basis for treatment. Nasal washings and aspirates are unacceptable for Xpert Xpress SARS-CoV-2/FLU/RSV testing.  Fact Sheet for Patients: EntrepreneurPulse.com.au  Fact Sheet for Healthcare Providers: IncredibleEmployment.be  This test is not yet approved or cleared by the [place] FDA and has been authorized for detection and/or diagnosis of SARS-CoV-2 by FDA under an Emergency Use Authorization  (EUA). This EUA will remain in effect (meaning this test can be used) for the duration of the COVID-19 declaration under Section 564(b)(1) of the Act, 21 U.S.C. section 360bbb-3(b)(1), unless the authorization is terminated or revoked.     Resp Syncytial Virus by PCR 05/05/2022 NEGATIVE  NEGATIVE Final   Comment: (NOTE) Fact Sheet for Patients: EntrepreneurPulse.com.au  Fact Sheet for Healthcare Providers: IncredibleEmployment.be  This test is not yet approved or cleared by the [place] FDA and has been authorized for detection and/or diagnosis of SARS-CoV-2 by FDA under an Emergency Use Authorization (EUA). This EUA will remain in effect (meaning this test can be used) for the duration of the COVID-19 declaration under Section 564(b)(1) of the Act, 21 U.S.C. section 360bbb-3(b)(1), unless the authorization is terminated  or revoked.  Performed at [facility], [place] [address] [place], [place] [address]    WBC 05/05/2022 13.8 (H)  4.0 - 10.5 K/uL Final   RBC 05/05/2022 4.55  4.22 - 5.81 MIL/uL Final   Hemoglobin 05/05/2022 14.9  13.0 - 17.0 g/dL Final   HCT 05/05/2022 43.5  39.0 - 52.0 % Final   MCV 05/05/2022 95.6  80.0 - 100.0 fL Final   MCH 05/05/2022 32.7  26.0 - 34.0 pg Final   MCHC 05/05/2022 34.3  30.0 - 36.0 g/dL Final   RDW 05/05/2022 13.1  11.5 - 15.5 % Final   Platelets 05/05/2022 265  150 - 400 K/uL Final   nRBC 05/05/2022 0.0  0.0 - 0.2 % Final   Neutrophils Relative % 05/05/2022 77  % Final   Neutro Abs 05/05/2022 10.6 (H)  1.7 - 7.7 K/uL Final   Lymphocytes Relative 05/05/2022 14  % Final   Lymphs Abs 05/05/2022 1.9  0.7 - 4.0 K/uL Final   Monocytes Relative 05/05/2022 7  % Final   Monocytes Absolute 05/05/2022 1.0  0.1 - 1.0 K/uL Final   Eosinophils Relative 05/05/2022 1  % Final   Eosinophils Absolute 05/05/2022 0.2  0.0 - 0.5 K/uL Final   Basophils Relative 05/05/2022 1  % Final   Basophils Absolute 05/05/2022  0.1  0.0 - 0.1 K/uL Final   Immature Granulocytes 05/05/2022 0  % Final   Abs Immature Granulocytes 05/05/2022 0.06  0.00 - 0.07 K/uL Final   Performed at [facility], [place] [address] [place], [place] [address]   Sodium 05/05/2022 139  135 - 145 mmol/L Final   Potassium 05/05/2022 4.0  3.5 - 5.1 mmol/L Final   Chloride 05/05/2022 102  98 - 111 mmol/L Final   CO2 05/05/2022 28  22 - 32 mmol/L Final   Glucose, Bld 05/05/2022 102 (H)  70 - 99 mg/dL Final   Glucose reference range applies only to samples taken after fasting for at least 8 hours.   BUN 05/05/2022 11  6 - 20 mg/dL Final   Creatinine, Ser 05/05/2022 1.03  0.61 - 1.24 mg/dL Final   Calcium 05/05/2022 9.1  8.9 - 10.3 mg/dL Final   Total Protein 05/05/2022 6.5  6.5 - 8.1 g/dL Final   Albumin 05/05/2022 3.6  3.5 - 5.0 g/dL Final   AST 05/05/2022 20  15 - 41 U/L Final   ALT 05/05/2022 21  0 - 44 U/L Final   Alkaline Phosphatase 05/05/2022 61  38 - 126 U/L Final   Total Bilirubin 05/05/2022 0.7  0.3 - 1.2 mg/dL Final   GFR, Estimated 05/05/2022 >60  >60 mL/min Final   Comment: (NOTE) Calculated using the CKD-EPI Creatinine Equation (2021)    Anion gap 05/05/2022 9  5 - 15 Final   Performed at [facility] [address] [place], [place] [address]   Hgb A1c MFr Bld 05/05/2022 5.2  4.8 - 5.6 % Final   Comment: (NOTE) Pre diabetes:          5.7%-6.4%  Diabetes:              >6.4%  Glycemic control for   <7.0% adults with diabetes    Mean Plasma Glucose 05/05/2022 102.54  mg/dL Final   Performed at [facility], [place] [address], [place], [place] [address]   Magnesium 05/05/2022 2.3  1.7 - 2.4 mg/dL Final   Performed at [facility], [address] [place], [place] [address]   Alcohol, Ethyl (B) 05/05/2022 <10  <10 mg/dL Final   Comment: (NOTE) Lowest detectable limit for serum alcohol is 10 mg/dL.  For medical purposes only. Performed at [facility], [place] [address], [place],  [place] [address]    Prolactin 05/05/2022 11.4  3.6 - 25.2 ng/mL Final   Comment: (NOTE) Performed At: [facility] [place], [place] [id number] Rush Farmer MD [id number]    POC Amphetamine UR 05/05/2022 None Detected  NONE DETECTED (Cut Off Level 1000 ng/mL) Final   POC Secobarbital (BAR) 05/05/2022 None Detected  NONE DETECTED (Cut Off Level 300 ng/mL) Final   POC Buprenorphine (BUP) 05/05/2022 None Detected  NONE DETECTED (Cut Off Level 10 ng/mL) Final   POC Oxazepam (BZO) 05/05/2022 None Detected  NONE DETECTED (Cut Off Level 300 ng/mL) Final   POC Cocaine UR 05/05/2022 None Detected  NONE DETECTED (Cut Off Level 300 ng/mL) Final   POC Methamphetamine UR 05/05/2022 None Detected  NONE DETECTED (Cut Off Level 1000 ng/mL) Final   POC Morphine 05/05/2022 None Detected  NONE DETECTED (Cut Off Level 300 ng/mL) Final   POC Methadone UR 05/05/2022 None Detected  NONE DETECTED (Cut Off Level 300 ng/mL) Final   POC Oxycodone UR 05/05/2022 None Detected  NONE DETECTED (Cut Off Level 100 ng/mL) Final   POC Marijuana UR 05/05/2022 Positive (A)  NONE DETECTED (Cut Off Level 50 ng/mL) Final   Cholesterol 05/05/2022 125  0 - 200 mg/dL Final   Triglycerides 05/05/2022 150 (H)  <150 mg/dL Final   HDL 05/05/2022 49  >40 mg/dL Final   Total CHOL/HDL Ratio 05/05/2022 2.6  RATIO Final   VLDL 05/05/2022 30  0 - 40 mg/dL Final   LDL Cholesterol 05/05/2022 46  0 - 99 mg/dL Final   Comment:        Total Cholesterol/HDL:CHD Risk Coronary Heart Disease Risk Table                     Men   Women  1/2 Average Risk   3.4   3.3  Average Risk       5.0   4.4  2 X Average Risk   9.6   7.1  3 X Average Risk  23.4   11.0        Use the calculated Patient Ratio above and the CHD Risk Table to determine the patient's CHD Risk.        ATP III CLASSIFICATION (LDL):  <100     mg/dL   Optimal  100-129  mg/dL   Near or Above                    Optimal  130-159  mg/dL   Borderline  160-189   mg/dL   High  >190     mg/dL   Very High Performed at [facility] [address], [place], [place] [address]    TSH 05/05/2022 0.665  0.350 - 4.500 uIU/mL Final   Comment: Performed by a 3rd Generation assay with a functional sensitivity of <=0.01 uIU/mL. Performed at [facility], [place] [address], [place], [place] [address]    SARSCOV2ONAVIRUS 2 AG 05/05/2022 NEGATIVE  NEGATIVE Final   Comment: (NOTE) SARS-CoV-2 antigen NOT DETECTED.   Negative results are presumptive.  Negative results do not preclude SARS-CoV-2 infection and should not be used as the sole basis for treatment or other patient  management decisions, including infection  control decisions, particularly in the presence of clinical signs and  symptoms consistent with COVID-19, or in those who have been in contact with the virus.  Negative results must be combined with clinical observations, patient history, and epidemiological information. The expected result is Negative.  Fact Sheet for Patients: HandmadeRecipes.com.cy  Fact Sheet for Healthcare Providers: FuneralLife.at  This test is not yet approved or cleared by the [place] FDA and  has been authorized for detection and/or diagnosis of SARS-CoV-2 by FDA under an Emergency Use Authorization (EUA).  This EUA will remain in effect (meaning this test can be used) for the duration of  the COV                          ID-19 declaration under Section 564(b)(1) of the Act, 21 U.S.C. section 360bbb-3(b)(1), unless the authorization is terminated or revoked sooner.    Admission on 05/04/2022, Discharged on 05/04/2022  Component Date Value Ref Range Status   SARS Coronavirus 2 by RT PCR 05/04/2022 NEGATIVE  NEGATIVE Final   Influenza A by PCR 05/04/2022 NEGATIVE  NEGATIVE Final   Influenza B by PCR 05/04/2022 NEGATIVE  NEGATIVE Final   Comment: (NOTE) The Xpert Xpress SARS-CoV-2/FLU/RSV plus assay is  intended as an aid in the diagnosis of influenza from Nasopharyngeal swab specimens and should not be used as a sole basis for treatment. Nasal washings and aspirates are unacceptable for Xpert Xpress SARS-CoV-2/FLU/RSV testing.  Fact Sheet for Patients: EntrepreneurPulse.com.au  Fact Sheet for Healthcare Providers: IncredibleEmployment.be  This test is not yet approved or cleared by the [place] FDA and has been authorized for detection and/or diagnosis of SARS-CoV-2 by FDA under an Emergency Use Authorization (EUA). This EUA will remain in effect (meaning this test can be used) for the duration of the COVID-19 declaration under Section 564(b)(1) of the Act, 21 U.S.C. section 360bbb-3(b)(1), unless the authorization is terminated or revoked.     Resp Syncytial Virus by PCR 05/04/2022 NEGATIVE  NEGATIVE Final   Comment: (NOTE) Fact Sheet for Patients: EntrepreneurPulse.com.au  Fact Sheet for Healthcare Providers: IncredibleEmployment.be  This test is not yet approved or cleared by the [place] FDA and has been authorized for detection and/or diagnosis of SARS-CoV-2 by FDA under an Emergency Use Authorization (EUA). This EUA will remain in effect (meaning this test can be used) for the duration of the COVID-19 declaration under Section 564(b)(1) of the Act, 21 U.S.C. section 360bbb-3(b)(1), unless the authorization is terminated or revoked.  Performed at [facility], [place] [address] [place], [place] [address]   Admission on 04/14/2022, Discharged on 04/14/2022  Component Date Value Ref Range Status   WBC 04/13/2022 6.4  4.0 - 10.5 K/uL Final   RBC 04/13/2022 4.50  4.22 - 5.81 MIL/uL Final   Hemoglobin 04/13/2022 14.5  13.0 - 17.0 g/dL Final   HCT 04/13/2022 41.0  39.0 - 52.0 % Final   MCV 04/13/2022 91.1  80.0 - 100.0 fL Final   MCH 04/13/2022 32.2  26.0 - 34.0 pg Final   MCHC  04/13/2022 35.4  30.0 - 36.0 g/dL Final   RDW 04/13/2022 12.0  11.5 - 15.5 % Final   Platelets 04/13/2022 321  150 - 400 K/uL Final   nRBC 04/13/2022 0.0  0.0 - 0.2 % Final   Neutrophils Relative % 04/13/2022 58  % Final   Neutro Abs 04/13/2022 3.7  1.7 - 7.7 K/uL Final  Lymphocytes Relative 04/13/2022 28  % Final   Lymphs Abs 04/13/2022 1.8  0.7 - 4.0 K/uL Final   Monocytes Relative 04/13/2022 9  % Final   Monocytes Absolute 04/13/2022 0.6  0.1 - 1.0 K/uL Final   Eosinophils Relative 04/13/2022 4  % Final   Eosinophils Absolute 04/13/2022 0.3  0.0 - 0.5 K/uL Final   Basophils Relative 04/13/2022 1  % Final   Basophils Absolute 04/13/2022 0.1  0.0 - 0.1 K/uL Final   Immature Granulocytes 04/13/2022 0  % Final   Abs Immature Granulocytes 04/13/2022 0.02  0.00 - 0.07 K/uL Final   Performed at [facility], [place], [place] [address]   Sodium 04/13/2022 137  135 - 145 mmol/L Final   Potassium 04/13/2022 3.5  3.5 - 5.1 mmol/L Final   Chloride 04/13/2022 105  98 - 111 mmol/L Final   CO2 04/13/2022 24  22 - 32 mmol/L Final   Glucose, Bld 04/13/2022 105 (H)  70 - 99 mg/dL Final   Glucose reference range applies only to samples taken after fasting for at least 8 hours.   BUN 04/13/2022 8  6 - 20 mg/dL Final   Creatinine, Ser 04/13/2022 1.09  0.61 - 1.24 mg/dL Final   Calcium 04/13/2022 9.1  8.9 - 10.3 mg/dL Final   GFR, Estimated 04/13/2022 >60  >60 mL/min Final   Comment: (NOTE) Calculated using the CKD-EPI Creatinine Equation (2021)    Anion gap 04/13/2022 8  5 - 15 Final   Performed at [facility], [address], [place], [place] [address]  Office Visit on 11/10/2021  Component Date Value Ref Range Status   HIV 1 RNA Quant 11/10/2021 Not Detected  Copies/mL Final   HIV-1 RNA Quant, Log 11/10/2021 Not Detected  Log cps/mL Final   Comment: . Reference Range:                           Not Detected     copies/mL                            Not Detected Log copies/mL . Marland Kitchen The test was performed using Real-Time Polymerase Chain Reaction. . . Reportable Range: 20 copies/mL to 10,000,000 copies/mL (1.30 Log copies/mL to 7.00 Log copies/mL). .    RPR Ser Ql 11/10/2021 NON-REACTIVE  NON-REACTIVE Final   Glucose, Bld 11/10/2021 96  65 - 99 mg/dL Final   Comment: .            Fasting reference interval .    BUN 11/10/2021 15  7 - 25 mg/dL Final   Creat 11/10/2021 1.32 (H)  0.70 - 1.30 mg/dL Final   eGFR 11/10/2021 62  > OR = 60 mL/min/1.76m Final   BUN/Creatinine Ratio 11/10/2021 11  6 - 22 (calc) Final   Sodium 11/10/2021 140  135 - 146 mmol/L Final   Potassium 11/10/2021 4.0  3.5 - 5.3 mmol/L Final   Chloride 11/10/2021 107  98 - 110 mmol/L Final   CO2 11/10/2021 27  20 - 32 mmol/L Final   Calcium 11/10/2021 9.5  8.6 - 10.3 mg/dL Final   Total Protein 11/10/2021 7.9  6.1 - 8.1 g/dL Final   Albumin 11/10/2021 4.6  3.6 - 5.1 g/dL Final   Globulin 11/10/2021 3.3  1.9 - 3.7 g/dL (calc) Final   AG Ratio 11/10/2021 1.4  1.0 - 2.5 (calc) Final  Total Bilirubin 11/10/2021 0.8  0.2 - 1.2 mg/dL Final   Alkaline phosphatase (APISO) 11/10/2021 68  35 - 144 U/L Final   AST 11/10/2021 19  10 - 35 U/L Final   ALT 11/10/2021 20  9 - 46 U/L Final   WBC 11/10/2021 7.1  3.8 - 10.8 Thousand/uL Final   RBC 11/10/2021 4.71  4.20 - 5.80 Million/uL Final   Hemoglobin 11/10/2021 15.3  13.2 - 17.1 g/dL Final   HCT 11/10/2021 44.2  38.5 - 50.0 % Final   MCV 11/10/2021 93.8  80.0 - 100.0 fL Final   MCH 11/10/2021 32.5  27.0 - 33.0 pg Final   MCHC 11/10/2021 34.6  32.0 - 36.0 g/dL Final   RDW 11/10/2021 13.0  11.0 - 15.0 % Final   Platelets 11/10/2021 297  140 - 400 Thousand/uL Final   MPV 11/10/2021 10.6  7.5 - 12.5 fL Final   Neutro Abs 11/10/2021 4,367  1,500 - 7,800 cells/uL Final   Lymphs Abs 11/10/2021 1,924  850 - 3,900 cells/uL Final   Absolute Monocytes 11/10/2021 490  200 - 950 cells/uL Final   Eosinophils Absolute  11/10/2021 263  15 - 500 cells/uL Final   Basophils Absolute 11/10/2021 57  0 - 200 cells/uL Final   Neutrophils Relative % 11/10/2021 61.5  % Final   Total Lymphocyte 11/10/2021 27.1  % Final   Monocytes Relative 11/10/2021 6.9  % Final   Eosinophils Relative 11/10/2021 3.7  % Final   Basophils Relative 11/10/2021 0.8  % Final   CD4 T Cell Abs 11/10/2021 274 (L)  400 - 1,790 /uL Final   CD4 % Helper T Cell 11/10/2021 15 (L)  33 - 65 % Final   Performed at [facility], [place] [address] [place], [place] [address]   Cholesterol 11/10/2021 151  <200 mg/dL Final   HDL 11/10/2021 47  > OR = 40 mg/dL Final   Triglycerides 11/10/2021 92  <150 mg/dL Final   LDL Cholesterol (Calc) 11/10/2021 85  mg/dL (calc) Final   Comment: Reference range: <100 . Desirable range <100 mg/dL for primary prevention;   <70 mg/dL for patients with CHD or diabetic patients  with > or = 2 CHD risk factors. Marland Kitchen LDL-C is now calculated using the Martin-Hopkins  calculation, which is a validated novel method providing  better accuracy than the Friedewald equation in the  estimation of LDL-C.  Cresenciano Genre et al. Annamaria Helling. [id number]): 2061-2068  (http://education.[employer].com/faq/FAQ164)    Total CHOL/HDL Ratio 11/10/2021 3.2  <5.0 (calc) Final   Non-HDL Cholesterol (Calc) 11/10/2021 104  <130 mg/dL (calc) Final   Comment: For patients with diabetes plus 1 major ASCVD risk  factor, treating to a non-HDL-C goal of <100 mg/dL  (LDL-C of <70 mg/dL) is considered a therapeutic  option.    Neisseria Gonorrhea 11/10/2021 Negative   Final   Chlamydia 11/10/2021 Negative   Final   Comment 11/10/2021 Normal Reference Ranger Chlamydia - Negative   Final   Comment 11/10/2021 Normal Reference Range Neisseria Gonorrhea - Negative   Final   (tTG) Ab, IgA 11/10/2021 <1.0  U/mL Final   Comment: Value          Interpretation -----          -------------- <15.0          Antibody not detected > or = 15.0     Antibody detected .     Blood Alcohol level:  Lab Results  Component Value Date  ETH <10 [id number]    Metabolic Disorder Labs: Lab Results  Component Value Date   HGBA1C 5.2 05/05/2022   MPG 102.54 05/05/2022   Lab Results  Component Value Date   PROLACTIN 11.4 05/05/2022   Lab Results  Component Value Date   CHOL 125 05/05/2022   TRIG 150 (H) 05/05/2022   HDL 49 05/05/2022   CHOLHDL 2.6 05/05/2022   VLDL 30 05/05/2022   LDLCALC 46 05/05/2022   LDLCALC 85 11/10/2021    Therapeutic Lab Levels: No results found for: "LITHIUM" No results found for: "VALPROATE" No results found for: "CBMZ"  Physical Findings   PHQ2-9    [place] ED from 05/05/2022 in [facility] Office Visit from 11/10/2021 in [facility] for Infectious Disease  PHQ-2 Total Score 4 0  PHQ-9 Total Score 13 --      Flowsheet Row ED from 05/05/2022 in [facility] Most recent reading at 05/05/2022 10:03 PM ED from 05/04/2022 in [facility] Emergency Department at [facility] Most recent reading at 05/04/2022  6:44 PM ED from 05/04/2022 in [facility] Most recent reading at 05/04/2022 11:27 AM  C-SSRS RISK CATEGORY No Risk No Risk No Risk        Musculoskeletal  Strength & Muscle Tone: within normal limits Gait & Station: normal Patient leans: N/A  Psychiatric Specialty Exam  Presentation  General Appearance:  Appropriate for Environment; Casual  Eye Contact: Good  Speech: Clear and Coherent; Normal Rate  Speech Volume: Normal  Handedness: Right   Mood and Affect  Mood: Euthymic  Affect: Appropriate; Congruent   Thought Process  Thought Processes: Coherent; Goal Directed  Descriptions of Associations:Intact  Orientation:Full (Time, Place and Person)  Thought Content:WDL; Logical  Diagnosis of Schizophrenia or Schizoaffective disorder in past: No     Hallucinations:Hallucinations: None  Ideas of Reference:None  Suicidal Thoughts:Suicidal Thoughts: No  Homicidal Thoughts:Homicidal Thoughts: No   Sensorium  Memory: Immediate Good; Recent Good  Judgment: Good  Insight: Good   Executive Functions  Concentration: Good  Attention Span: Good  Recall: Good  Fund of Knowledge: Good  Language: Good   Psychomotor Activity  Psychomotor Activity:Psychomotor Activity: Normal   Assets  Assets: Communication Skills; Desire for Improvement; Physical Health; Resilience; Social Support   Sleep  Sleep:Sleep: Fair   No data recorded  Physical Exam  Physical Exam Vitals and nursing note reviewed.  Constitutional:      General: He is not in acute distress.    Appearance: Normal appearance. He is normal weight. He is not ill-appearing or toxic-appearing.  HENT:     Head: Normocephalic and atraumatic.  Pulmonary:     Effort: Pulmonary effort is normal.  Musculoskeletal:        General: Normal range of motion.  Neurological:     General: No focal deficit present.     Mental Status: He is alert.   Review of Systems  Constitutional:  Positive for diaphoresis.  Respiratory:  Negative for cough and shortness of breath.   Cardiovascular:  Negative for chest pain.  Gastrointestinal:  Negative for abdominal pain, constipation, diarrhea, nausea and vomiting.  Musculoskeletal:  Positive for back pain.  Neurological:  Negative for dizziness, weakness and headaches.  Psychiatric/Behavioral:  Negative for depression, hallucinations and suicidal ideas. The patient is not nervous/anxious.    Blood pressure (!) 150/88, pulse 64, temperature 98.6 F (37 C), temperature source Tympanic, resp. rate 18, SpO2 100 %.  There is no height or weight on file to calculate BMI.  Treatment Plan Summary: Daily contact with patient to assess and evaluate symptoms and progress in treatment and Medication management  Mrk Rohrbaugh is a 62  yr old male who presented to [facility] on 2/8 requesting to detox from Cocaine, he was admitted to [facility] on 2/8.  PPHx is significant for MDD and Cocaine Abuse, and no history of Suicide Attempt or Self Injurious Behavior.  Denies unilateral facial pain.   MDD -Continue Remeron 30 mg QHS for depression and anxiety.   HIV: -Continue Biktarvy 50-200-25 mg daily   Insomnia: -Continue Melatonin 3 mg QHS   Asthma: -Albuterol PRN   Dyslipidemia: -Continue Liptor 20 mg daily    Ear Infection: -Continue Augmentin 875-125 mg q12h (to complete 2/13)   -Continue Diclofenac EC 75 mg BID -Continue Tessalon 100 mg BID PRN -Continue PRN's: Tylenol, Maalox, Atarax, Milk of Magnesia, Trazodone   Dispo: [facility] in [place] after going to court   France Ravens, MD 05/09/2022 9:14 AM

## 2022-05-09 NOTE — ED Notes (Signed)
Pt asleep in bed. Respirations even and unlabored. Monitoring for safety. 

## 2022-05-09 NOTE — ED Notes (Signed)
Patient resting quietly in bed with eyes closed. Respirations equal and unlabored, skin warm and dry, NAD. No change in assessment or acuity. Q 15 minute safety checks remain in place.

## 2022-05-09 NOTE — ED Notes (Signed)
Pt is sleeping. No distress noted. Will continue to monitor safety.

## 2022-05-09 NOTE — ED Notes (Signed)
Patient A&Ox4. Patient denies SI/HI and AVH. Patient denies any physical complaints when asked. No acute distress noted. Support and encouragement provided. Q 15 min safety checks remain in place. Encouraged patient to notify staff if thoughts of harm toward self or others arise. Patient verbalize understanding and agreement. Will continue to monitor for safety.

## 2022-05-09 NOTE — ED Provider Notes (Signed)
FBC/OBS ASAP Discharge Summary  Date and Time: 05/10/2022 Name: Mike Harrison  MRN:  [id number]   Discharge Diagnoses:  Final diagnoses:  Cocaine use disorder ([place])  HIV disease ([place])  Severe episode of recurrent major depressive disorder, without psychotic features ([place])  Substance induced mood disorder ([place])    Subjective: Seen and assessed at bedside. No acute complaints. Agreeable to discharge plan.  Stay Summary: Patient completed detox course for crack cocaine. Symptoms of depression were managed with increase in remeron to 30 mg nightly. Patient plans to go to court to address legal charges, gather his belongings from apartment, and go to [facility] in [place].   Total Time spent with patient: 45 minutes  Tobacco Cessation:  A prescription for an FDA-approved tobacco cessation medication was offered at discharge and the patient refused  Current Medications:  Current Facility-Administered Medications  Medication Dose Route Frequency Provider Last Rate Last Admin   acetaminophen (TYLENOL) tablet 650 mg  650 mg Oral Q6H PRN Lucky Rathke, FNP       albuterol (VENTOLIN HFA) 108 (90 Base) MCG/ACT inhaler 2 puff  2 puff Inhalation Q2H PRN Lucky Rathke, FNP       alum & mag hydroxide-simeth (MAALOX/MYLANTA) 200-200-20 MG/5ML suspension 30 mL  30 mL Oral Q4H PRN Lucky Rathke, FNP       amoxicillin-clavulanate (AUGMENTIN) 875-125 MG per tablet 1 tablet  1 tablet Oral Q12H Lucky Rathke, FNP   1 tablet at 05/09/22 0936   atorvastatin (LIPITOR) tablet 20 mg  20 mg Oral Daily Hampton Abbot, MD   20 mg at 05/09/22 0936   benzonatate (TESSALON) capsule 100 mg  100 mg Oral BID PRN Lucky Rathke, FNP   100 mg at 05/05/22 2103   bictegravir-emtricitabine-tenofovir AF (BIKTARVY) 50-200-25 MG per tablet 1 tablet  1 tablet Oral Daily Lucky Rathke, FNP   1 tablet at 05/09/22 0936   diclofenac (VOLTAREN) EC tablet 75 mg  75 mg Oral BID Lucky Rathke, FNP   75 mg at 05/09/22 1002   hydrOXYzine (ATARAX)  tablet 25 mg  25 mg Oral TID PRN Lucky Rathke, FNP   25 mg at 05/05/22 2101   magnesium hydroxide (MILK OF MAGNESIA) suspension 30 mL  30 mL Oral Daily PRN Lucky Rathke, FNP       melatonin tablet 3 mg  3 mg Oral QHS Briant Cedar, MD   3 mg at 05/08/22 2131   mirtazapine (REMERON) tablet 30 mg  30 mg Oral Aliene Altes, MD   30 mg at 05/08/22 2131   traZODone (DESYREL) tablet 50 mg  50 mg Oral QHS PRN Lucky Rathke, FNP       Current Outpatient Medications  Medication Sig Dispense Refill   amoxicillin-clavulanate (AUGMENTIN) 875-125 MG tablet Take 1 tablet by mouth every 12 (twelve) hours. (Patient taking differently: Take 1 tablet by mouth every 12 (twelve) hours. Take for 7 days starting on 05/04/22.) 14 tablet 0   bictegravir-emtricitabine-tenofovir AF (BIKTARVY) 50-200-25 MG TABS tablet Take 1 tablet by mouth daily. 30 tablet 11   Multiple Vitamins-Iron (MULTIVITAMIN/IRON PO) Take 1 tablet by mouth daily.     Pitavastatin Magnesium 4 MG TABS Take 1 tablet by mouth daily. 30 tablet 11   Vitamin D, Cholecalciferol, 25 MCG (1000 UT) TABS Take 2,000 Units by mouth every other day.     mirtazapine (REMERON) 15 MG tablet Take 2 tablets (30 mg total) by mouth at bedtime.  PTA Medications:  Facility Ordered Medications  Medication   acetaminophen (TYLENOL) tablet 650 mg   alum & mag hydroxide-simeth (MAALOX/MYLANTA) 200-200-20 MG/5ML suspension 30 mL   magnesium hydroxide (MILK OF MAGNESIA) suspension 30 mL   hydrOXYzine (ATARAX) tablet 25 mg   traZODone (DESYREL) tablet 50 mg   albuterol (VENTOLIN HFA) 108 (90 Base) MCG/ACT inhaler 2 puff   amoxicillin-clavulanate (AUGMENTIN) 875-125 MG per tablet 1 tablet   bictegravir-emtricitabine-tenofovir AF (BIKTARVY) 50-200-25 MG per tablet 1 tablet   benzonatate (TESSALON) capsule 100 mg   diclofenac (VOLTAREN) EC tablet 75 mg   atorvastatin (LIPITOR) tablet 20 mg   mirtazapine (REMERON) tablet 30 mg   melatonin tablet 3 mg   PTA  Medications  Medication Sig   bictegravir-emtricitabine-tenofovir AF (BIKTARVY) 50-200-25 MG TABS tablet Take 1 tablet by mouth daily.   Pitavastatin Magnesium 4 MG TABS Take 1 tablet by mouth daily.   amoxicillin-clavulanate (AUGMENTIN) 875-125 MG tablet Take 1 tablet by mouth every 12 (twelve) hours. (Patient taking differently: Take 1 tablet by mouth every 12 (twelve) hours. Take for 7 days starting on 05/04/22.)   Vitamin D, Cholecalciferol, 25 MCG (1000 UT) TABS Take 2,000 Units by mouth every other day.   Multiple Vitamins-Iron (MULTIVITAMIN/IRON PO) Take 1 tablet by mouth daily.   mirtazapine (REMERON) 15 MG tablet Take 2 tablets (30 mg total) by mouth at bedtime.       05/09/2022   11:09 AM 05/08/2022   11:04 AM 05/05/2022    7:03 PM  Depression screen PHQ 2/9  Decreased Interest 2 2 3  $ Down, Depressed, Hopeless 2 2 2  $ PHQ - 2 Score 4 4 5  $ Altered sleeping  2 3  Tired, decreased energy  2 3  Change in appetite  1 2  Feeling bad or failure about yourself   2 3  Trouble concentrating  2 3  Moving slowly or fidgety/restless  0 3  Suicidal thoughts  0 0  PHQ-9 Score  13 22  Difficult doing work/chores  Somewhat difficult Very difficult    Flowsheet Row ED from 05/05/2022 in [facility] Most recent reading at 05/05/2022 10:03 PM ED from 05/04/2022 in [facility] Emergency Department at [facility] Most recent reading at 05/04/2022  6:44 PM ED from 05/04/2022 in [facility] [place] Most recent reading at 05/04/2022 11:27 AM  C-SSRS RISK CATEGORY No Risk No Risk No Risk       Musculoskeletal  Strength & Muscle Tone: within normal limits Gait & Station: normal Patient leans: N/A  Psychiatric Specialty Exam  Presentation  General Appearance:  Appropriate for Environment; Casual  Eye Contact: Good  Speech: Clear and Coherent; Normal Rate  Speech Volume: Normal  Handedness: Right   Mood and Affect   Mood: Euthymic  Affect: Appropriate; Congruent   Thought Process  Thought Processes: Coherent; Goal Directed  Descriptions of Associations:Intact  Orientation:Full (Time, Place and Person)  Thought Content:WDL; Logical  Diagnosis of Schizophrenia or Schizoaffective disorder in past: No    Hallucinations:Hallucinations: None  Ideas of Reference:None  Suicidal Thoughts:Suicidal Thoughts: No  Homicidal Thoughts:Homicidal Thoughts: No   Sensorium  Memory: Immediate Good; Recent Good  Judgment: Good  Insight: Good   Executive Functions  Concentration: Good  Attention Span: Good  Recall: Good  Fund of Knowledge: Good  Language: Good   Psychomotor Activity  Psychomotor Activity: Psychomotor Activity: Normal   Assets  Assets: Communication Skills; Desire for Improvement; Physical Health; Resilience; Social  Support   Sleep  Sleep: Sleep: Fair   No data recorded  Physical Exam  Physical Exam Vitals and nursing note reviewed.  Constitutional:      General: He is not in acute distress.    Appearance: He is well-developed.  HENT:     Head: Normocephalic and atraumatic.  Eyes:     Conjunctiva/sclera: Conjunctivae normal.  Cardiovascular:     Rate and Rhythm: Normal rate and regular rhythm.     Heart sounds: No murmur heard. Pulmonary:     Effort: Pulmonary effort is normal. No respiratory distress.     Breath sounds: Normal breath sounds.  Abdominal:     Palpations: Abdomen is soft.     Tenderness: There is no abdominal tenderness.  Musculoskeletal:        General: No swelling.     Cervical back: Neck supple.  Skin:    General: Skin is warm and dry.     Capillary Refill: Capillary refill takes less than 2 seconds.  Neurological:     Mental Status: He is alert.  Psychiatric:        Mood and Affect: Mood normal.    Review of Systems  Respiratory:  Negative for shortness of breath.   Cardiovascular:  Negative for chest pain.   Gastrointestinal:  Negative for abdominal pain, constipation, diarrhea, heartburn, nausea and vomiting.  Neurological:  Negative for headaches.   Blood pressure (!) 150/88, pulse 64, temperature 98.6 F (37 C), temperature source Tympanic, resp. rate 18, SpO2 100 %. There is no height or weight on file to calculate BMI.  Demographic Factors:  Male, Living alone, and Unemployed  Loss Factors: Financial problems/change in socioeconomic status  Historical Factors: Impulsivity  Risk Reduction Factors:   Positive social support, Positive therapeutic relationship, and Positive coping skills or problem solving skills  Continued Clinical Symptoms:  Alcohol/Substance Abuse/Dependencies More than one psychiatric diagnosis Medical Diagnoses and Treatments/Surgeries  Cognitive Features That Contribute To Risk:  None    Suicide Risk:  Mild:  Suicidal ideation of limited frequency, intensity, duration, and specificity.  There are no identifiable plans, no associated intent, mild dysphoria and related symptoms, good self-control (both objective and subjective assessment), few other risk factors, and identifiable protective factors, including available and accessible social support.  Plan Of Care/Follow-up recommendations:   Follow-up recommendations:   Activity:  as tolerated Diet:  heart healthy   Comments:  Prescriptions were given at discharge.  Patient is agreeable with the discharge plan.  Patient was given an opportunity to ask questions.  Patient appears to feel comfortable with discharge and denies any current suicidal or homicidal thoughts.    Patient is instructed prior to discharge to: Take all medications as prescribed by mental healthcare provider. Report any adverse effects and or reactions from the medicines to outpatient provider promptly. In the event of worsening symptoms, patient is instructed to call the crisis hotline, 911 and or go to the nearest ED for appropriate  evaluation and treatment of symptoms. Patient is to follow-up with primary care provider for other medical issues, concerns and or health care needs.   France Ravens, MD 05/09/2022, 11:17 AM

## 2022-05-10 ENCOUNTER — Other Ambulatory Visit (HOSPITAL_COMMUNITY): Payer: Self-pay

## 2022-05-10 DIAGNOSIS — F1590 Other stimulant use, unspecified, uncomplicated: Secondary | ICD-10-CM | POA: Diagnosis not present

## 2022-05-10 DIAGNOSIS — F1410 Cocaine abuse, uncomplicated: Secondary | ICD-10-CM | POA: Diagnosis not present

## 2022-05-10 DIAGNOSIS — F1414 Cocaine abuse with cocaine-induced mood disorder: Secondary | ICD-10-CM | POA: Diagnosis not present

## 2022-05-10 DIAGNOSIS — Z716 Tobacco abuse counseling: Secondary | ICD-10-CM | POA: Diagnosis not present

## 2022-05-10 DIAGNOSIS — Z79899 Other long term (current) drug therapy: Secondary | ICD-10-CM | POA: Diagnosis not present

## 2022-05-10 DIAGNOSIS — F17210 Nicotine dependence, cigarettes, uncomplicated: Secondary | ICD-10-CM | POA: Diagnosis not present

## 2022-05-10 DIAGNOSIS — F1290 Cannabis use, unspecified, uncomplicated: Secondary | ICD-10-CM | POA: Diagnosis not present

## 2022-05-10 DIAGNOSIS — Z5900 Homelessness unspecified: Secondary | ICD-10-CM | POA: Diagnosis not present

## 2022-05-10 DIAGNOSIS — Z1152 Encounter for screening for COVID-19: Secondary | ICD-10-CM | POA: Diagnosis not present

## 2022-05-10 DIAGNOSIS — R69 Illness, unspecified: Secondary | ICD-10-CM | POA: Diagnosis not present

## 2022-05-10 NOTE — ED Notes (Signed)
Patient observed/assessed in room in bed appearing in no immediate distress resting peacefully. Q15 minute checks continued by MHT and nursing staff. Will continue to monitor and support. 

## 2022-05-10 NOTE — ED Notes (Signed)
"  Pt denies SI, plan, and intention.  Suicide safety plan completed, reviewed with this RN, given to the patient, and a copy in the chart." 

## 2022-05-10 NOTE — ED Notes (Signed)
Patient A&O x 4, ambulatory. Patient discharged in no acute distress. Patient denied SI/HI, A/VH upon discharge. Patient verbalized understanding of all discharge instructions explained by staff, to include follow up appointments, RX's and safety plan. Patient reported mood 10/10.  Pt belongings returned to patient from locker # 27 intact. Patient escorted to lobby via staff for transport to destination. Safety maintained.

## 2022-05-10 NOTE — ED Notes (Signed)
Patient resting quietly in bed with eyes closed. Respirations equal and unlabored, skin warm and dry, NAD. No change in assessment or acuity. Q 15 minute safety checks remain in place.

## 2022-05-10 NOTE — ED Notes (Signed)
Patient A&Ox4. Denies intent to harm self/others when asked. Denies A/VH. Patient denies any physical complaints when asked. No acute distress noted. Support and encouragement provided. Routine safety checks conducted according to facility protocol. Encouraged patient to notify staff if thoughts of harm toward self or others arise. Patient verbalize understanding and agreement. Will continue to monitor for safety.    

## 2022-05-13 ENCOUNTER — Other Ambulatory Visit (HOSPITAL_COMMUNITY): Payer: Self-pay

## 2022-05-16 ENCOUNTER — Other Ambulatory Visit (HOSPITAL_COMMUNITY): Payer: Self-pay

## 2022-05-22 NOTE — Progress Notes (Deleted)
Subjective:  Chief complaint: followup for HIV disease on medications    Patient ID: Mike Harrison, male    DOB: March 20, 1961, 62 y.o.   MRN: [id number]  HPI  Mike Harrison is a 62 year-old black man diagnosed with HIV and AIDS in 2007.  He does not recall a specific opportunistic infection though he remembers being on Bactrim for PCP prophylaxis when he developed a rash.  In the past when he has had a CD4 count below 200 he has been on dapsone typically.  He was taken care of at [facility] clinic recently and had been on [place].  Past antiviral history per the patient included Sustiva and Truvada followed by some other medications with Truvada which he cannot recall.  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  He had been off medications for nearly 9 months viral load was in the 40,000 range and CD4 count at 198.  He has had comorbid hypertension hyperlipidemia and asthma.  He says that the hyperlipidemia and hypertension have resolved when he is lost weight.  He had some intolerance to different antihypertensives including apparent scrotal swelling on thiazide diuretic and an ACE inhibitor induced cough.  Avril visits ago he had some pruritic skin lesions and thought that this could be due to a prior tick bites.  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  The beef IgE lab mutton IgE and pork IgE were all within normal.  He has continued to eat steak and other mammal products without having any problems he does have intermittent pruritus that may be related some of the foods he eats but he is not sure.  I did refer him to allergy immunology but he has not yet made an appointment with them that they did call him.  He is now  complaining also some left-sided knee pain which is an area where he previously had what sounds like a prepatellar septic bursitis.  He also is complaining of pain in his toenail where he appears to have some onychomycosis.  He is also concerned that he is not able to bulk up as much as he did before in the past when lifting weights and wonder if this is related to his antivirals.      Past Medical History:  Diagnosis Date  . Acute medial meniscus tear   . Allergic reaction to alpha-gal 02/12/2021  . Allergy to alpha-gal 11/10/2021  . Anxiety   . Arthritis   . Arthritis   . Asthma   . Depression   . Hyperlipidemia 12/31/2020  . Hypertension 12/31/2020  . Onychomycosis   . Pruritus   . Smoker   . Toe pain     Past Surgical History:  Procedure Laterality Date  . HERNIA REPAIR      Family History  Problem Relation Age of Onset  . Cancer Mother   . Hypertension Mother   . Cirrhosis Father       Social History   Socioeconomic History  . Marital status: Widowed    Spouse name: Not on file  . Number of children: Not on file  . Years of education: Not on file  . Highest education level: Not  on file  Occupational History  . Not on file  Tobacco Use  . Smoking status: Some Days    Packs/day: 0.50    Types: Cigarettes  . Smokeless tobacco: Never  . Tobacco comments:    Changed to Black and Mild and slowed down smoking. 11/10/21  Vaping Use  . Vaping Use: Never used  Substance and Sexual Activity  . Alcohol use: Never  . Drug use: Never  . Sexual activity: Not on file  Other Topics Concern  . Not on file  Social History Narrative  . Not on file   Social Determinants of Health   Financial Resource Strain: Not on file  Food Insecurity: Not on file  Transportation Needs: Not on file  Physical Activity: Not on file  Stress: Not on file  Social Connections: Not on file    Allergies  Allergen Reactions  . Hydrochlorothiazide Other (See Comments)    Scrotal  edema  . Lisinopril Other (See Comments)    Dry cough  . Bactrim [Sulfamethoxazole-Trimethoprim] Itching, Nausea And Vomiting and Other (See Comments)    Loose bowels, chills     Current Outpatient Medications:  .  amoxicillin-clavulanate (AUGMENTIN) 875-125 MG tablet, Take 1 tablet by mouth every 12 (twelve) hours. (Patient taking differently: Take 1 tablet by mouth every 12 (twelve) hours. Take for 7 days starting on 05/04/22.), Disp: 14 tablet, Rfl: 0 .  bictegravir-emtricitabine-tenofovir AF (BIKTARVY) 50-200-25 MG TABS tablet, Take 1 tablet by mouth daily., Disp: 30 tablet, Rfl: 11 .  mirtazapine (REMERON) 15 MG tablet, Take 2 tablets (30 mg total) by mouth at bedtime., Disp: , Rfl:  .  Multiple Vitamins-Iron (MULTIVITAMIN/IRON PO), Take 1 tablet by mouth daily., Disp: , Rfl:  .  Pitavastatin Magnesium 4 MG TABS, Take 1 tablet by mouth daily., Disp: 30 tablet, Rfl: 11 .  Vitamin D, Cholecalciferol, 25 MCG (1000 UT) TABS, Take 2,000 Units by mouth every other day., Disp: , Rfl:     Review of Systems  Constitutional:  Negative for activity change, appetite change, chills, diaphoresis, fatigue, fever and unexpected weight change.  HENT:  Negative for congestion, rhinorrhea, sinus pressure, sneezing, sore throat and trouble swallowing.   Eyes:  Negative for photophobia and visual disturbance.  Respiratory:  Negative for cough, chest tightness, shortness of breath, wheezing and stridor.   Cardiovascular:  Negative for chest pain, palpitations and leg swelling.  Gastrointestinal:  Negative for abdominal distention, abdominal pain, anal bleeding, blood in stool, constipation, diarrhea, nausea and vomiting.  Genitourinary:  Negative for difficulty urinating, dysuria, flank pain and hematuria.  Musculoskeletal:  Negative for arthralgias, back pain, gait problem, joint swelling and myalgias.  Skin:  Negative for color change, pallor, rash and wound.  Neurological:  Negative for dizziness,  tremors, weakness and light-headedness.  Hematological:  Negative for adenopathy. Does not bruise/bleed easily.  Psychiatric/Behavioral:  Negative for agitation, behavioral problems, confusion, decreased concentration, dysphoric mood and sleep disturbance.       Objective:   Physical Exam Constitutional:      Appearance: He is well-developed.  HENT:     Head: Normocephalic and atraumatic.  Eyes:     Conjunctiva/sclera: Conjunctivae normal.  Cardiovascular:     Rate and Rhythm: Normal rate and regular rhythm.  Pulmonary:     Effort: Pulmonary effort is normal. No respiratory distress.     Breath sounds: No wheezing.  Abdominal:     General: There is no distension.     Palpations: Abdomen is soft.  Musculoskeletal:  General: No tenderness. Normal range of motion.     Cervical back: Normal range of motion and neck supple.  Skin:    General: Skin is warm and dry.     Coloration: Skin is not pale.     Findings: No erythema or rash.  Neurological:     General: No focal deficit present.     Mental Status: He is alert and oriented to person, place, and time.  Psychiatric:        Mood and Affect: Mood normal.        Behavior: Behavior normal.        Thought Content: Thought content normal.        Judgment: Judgment normal.       Assessment & Plan:   HIV disease:  I will add order HIV viral load CD4 count CBC with differential CMP, RPR GC and chlamydia and I will continue  Candace Gallus,  prescription   Lab Results  Component Value Date   HIV1RNAQUANT Not Detected 11/10/2021   Cardiovascular risk: I have informed him the results of reprieve and will initiate pitavastatin  HTN:   Smoker: encouraged him to work on cessation.  Asthma: He is continuing on metered-dose inhaler I am initiating inhaled steroid for him to take twice daily  =

## 2022-05-23 ENCOUNTER — Ambulatory Visit: Payer: Medicare HMO | Admitting: Infectious Disease

## 2022-05-23 DIAGNOSIS — B20 Human immunodeficiency virus [HIV] disease: Secondary | ICD-10-CM

## 2022-05-23 DIAGNOSIS — E785 Hyperlipidemia, unspecified: Secondary | ICD-10-CM

## 2022-05-23 DIAGNOSIS — I10 Essential (primary) hypertension: Secondary | ICD-10-CM

## 2022-05-23 DIAGNOSIS — F1410 Cocaine abuse, uncomplicated: Secondary | ICD-10-CM

## 2022-05-24 NOTE — Progress Notes (Signed)
Cocaine use disorder, major depressive disorder, recurrent, severe, HIV positive, clinically unable to determine

## 2022-06-06 ENCOUNTER — Telehealth: Payer: Self-pay

## 2022-06-06 ENCOUNTER — Other Ambulatory Visit (HOSPITAL_COMMUNITY): Payer: Self-pay

## 2022-06-06 NOTE — Telephone Encounter (Signed)
Patient's insurance no longer covering Pitavastatin. Please change. Mike Harrison T Brooks Sailors

## 2022-06-07 ENCOUNTER — Other Ambulatory Visit: Payer: Self-pay | Admitting: Infectious Disease

## 2022-06-07 ENCOUNTER — Other Ambulatory Visit: Payer: Self-pay

## 2022-06-07 ENCOUNTER — Other Ambulatory Visit (HOSPITAL_COMMUNITY): Payer: Self-pay

## 2022-06-07 MED ORDER — ATORVASTATIN CALCIUM 20 MG PO TABS
20.0000 mg | ORAL_TABLET | Freq: Every day | ORAL | 11 refills | Status: DC
  Filled 2022-06-07: qty 30, 30d supply, fill #0

## 2022-06-07 NOTE — Telephone Encounter (Signed)
Patient informed. 

## 2022-08-04 ENCOUNTER — Other Ambulatory Visit (HOSPITAL_COMMUNITY): Payer: Self-pay

## 2022-09-21 ENCOUNTER — Other Ambulatory Visit (HOSPITAL_COMMUNITY): Payer: Self-pay

## 2022-10-14 ENCOUNTER — Other Ambulatory Visit (HOSPITAL_COMMUNITY): Payer: Self-pay

## 2022-10-17 ENCOUNTER — Other Ambulatory Visit (HOSPITAL_COMMUNITY): Payer: Self-pay

## 2022-10-18 ENCOUNTER — Other Ambulatory Visit: Payer: Self-pay

## 2022-10-18 ENCOUNTER — Encounter (HOSPITAL_COMMUNITY): Payer: Self-pay

## 2022-10-18 ENCOUNTER — Other Ambulatory Visit (HOSPITAL_COMMUNITY): Payer: Self-pay

## 2022-10-18 MED ORDER — ATORVASTATIN CALCIUM 20 MG PO TABS
ORAL_TABLET | ORAL | 11 refills | Status: DC
  Filled 2022-10-21: qty 30, 30d supply, fill #0

## 2022-10-18 MED ORDER — BICTEGRAVIR-EMTRICITAB-TENOFOV 50-200-25 MG PO TABS
ORAL_TABLET | ORAL | 11 refills | Status: DC
  Filled 2022-10-21: qty 30, 30d supply, fill #0

## 2022-10-21 ENCOUNTER — Other Ambulatory Visit: Payer: Self-pay

## 2022-10-21 ENCOUNTER — Other Ambulatory Visit (HOSPITAL_COMMUNITY): Payer: Self-pay

## 2022-11-17 ENCOUNTER — Other Ambulatory Visit (HOSPITAL_COMMUNITY): Payer: Self-pay

## 2022-11-22 ENCOUNTER — Other Ambulatory Visit (HOSPITAL_COMMUNITY)
Admission: RE | Admit: 2022-11-22 | Discharge: 2022-11-22 | Disposition: A | Discharge status: Home / Self Care | Payer: Self-pay | Source: Ambulatory Visit | Attending: Infectious Disease | Admitting: Infectious Disease

## 2022-11-22 ENCOUNTER — Other Ambulatory Visit: Payer: Self-pay

## 2022-11-22 ENCOUNTER — Encounter: Payer: Self-pay | Admitting: Infectious Disease

## 2022-11-22 ENCOUNTER — Ambulatory Visit (INDEPENDENT_AMBULATORY_CARE_PROVIDER_SITE_OTHER): Payer: Medicare HMO | Admitting: Infectious Disease

## 2022-11-22 ENCOUNTER — Other Ambulatory Visit (HOSPITAL_COMMUNITY): Payer: Self-pay

## 2022-11-22 VITALS — BP 153/85 | HR 75 | Temp 98.6°F | Wt 198.0 lb

## 2022-11-22 DIAGNOSIS — J4540 Moderate persistent asthma, uncomplicated: Secondary | ICD-10-CM

## 2022-11-22 DIAGNOSIS — M170 Bilateral primary osteoarthritis of knee: Secondary | ICD-10-CM | POA: Diagnosis not present

## 2022-11-22 DIAGNOSIS — B20 Human immunodeficiency virus [HIV] disease: Secondary | ICD-10-CM

## 2022-11-22 DIAGNOSIS — Z79899 Other long term (current) drug therapy: Secondary | ICD-10-CM | POA: Diagnosis not present

## 2022-11-22 DIAGNOSIS — M79671 Pain in right foot: Secondary | ICD-10-CM | POA: Diagnosis not present

## 2022-11-22 DIAGNOSIS — M79673 Pain in unspecified foot: Secondary | ICD-10-CM | POA: Insufficient documentation

## 2022-11-22 DIAGNOSIS — R195 Other fecal abnormalities: Secondary | ICD-10-CM | POA: Diagnosis not present

## 2022-11-22 DIAGNOSIS — I10 Essential (primary) hypertension: Secondary | ICD-10-CM | POA: Diagnosis not present

## 2022-11-22 DIAGNOSIS — R531 Weakness: Secondary | ICD-10-CM

## 2022-11-22 DIAGNOSIS — E785 Hyperlipidemia, unspecified: Secondary | ICD-10-CM

## 2022-11-22 DIAGNOSIS — M79672 Pain in left foot: Secondary | ICD-10-CM | POA: Diagnosis not present

## 2022-11-22 DIAGNOSIS — F1410 Cocaine abuse, uncomplicated: Secondary | ICD-10-CM

## 2022-11-22 MED ORDER — BICTEGRAVIR-EMTRICITAB-TENOFOV 50-200-25 MG PO TABS
ORAL_TABLET | ORAL | 11 refills | Status: DC
  Filled 2022-11-22: qty 30, 30d supply, fill #0
  Filled 2022-11-22: qty 30, fill #0
  Filled 2022-12-21: qty 30, 30d supply, fill #1
  Filled 2023-01-23: qty 30, 30d supply, fill #2
  Filled 2023-02-20: qty 30, 30d supply, fill #3
  Filled 2023-03-31: qty 30, 30d supply, fill #4

## 2022-11-22 NOTE — Progress Notes (Signed)
Subjective:  Chief complaint: Follow-up for his HIV disease on medications though he ran out roughly a week ago BIKTARVY.      Patient ID: Mike Harrison, male    DOB: 1960/08/04, 62 y.o.   MRN: [id number]  HPI  Mike Harrison is a 62 year-old black man diagnosed with HIV and AIDS in 2007.  He does not recall a specific opportunistic infection though he remembers being on Bactrim for PCP prophylaxis when he developed a rash.  In the past when he has had a CD4 count below 200 he has been on dapsone typically.  He was taken care of at [facility] clinic recently and had been on TRIUMEQ.  Past antiviral history per the patient included Sustiva and Truvada followed by some other medications with Truvada which he cannot recall.  Resistance testing in 2019 on 2 occasions at [facility] revealed a K 70 R as well as a 138G and a 138 A.  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  He has had comorbid hypertension hyperlipidemia and asthma.  He says that the hyperlipidemia and hypertension have resolved when he is lost weight.  Interim history:   He had some intolerance to different antihypertensives including apparent scrotal swelling on thiazide diuretic and an ACE inhibitor induced cough.  Avril visits ago he had some pruritic skin lesions and thought that this could be due to a prior tick bites.  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  The beef IgE lab mutton IgE and pork IgE were all within normal.  He has continued to eat steak and other mammal products without having any problems he does have intermittent pruritus that may be related some of the foods he eats but he is not sure.  I did refer him to allergy immunology but he has not  yet made an appointment with them that they did call him.  He is now complaining also some left-sided knee pain which is an area where he previously had what sounds like a prepatellar septic bursitis.  He also is complaining of pain in his toenail where he appears to have some onychomycosis.  He is also concerned that he is not able to bulk up as much as he did before in the past when lifting weights and wonder if this is related to his antivirals.   Today in clinic he has concerns about the [facility] making him weaker in terms of his ability to lift weights in the gym and also is complaining that is making him have loose bowel movements which I find hard to leave both in terms of the drug having any such effect in terms of how long he has been on this medication.  He has nearly been on it for 2 years JULUCA prescriptions that I gave him going back to October 2022.  He was requesting change to medications but I was not anxious to do so without looking through his resistance history.  Looking through his full right history it also bothers me that he does not seem to be feeling religiously on a month by month basis comes close but seems like every couple of months that where he elects to pick up his medications.  That he needs a high.  Her direct resistance drug.  I would not be opposed to Dovato as another option for him   Past Medical History:  Diagnosis Date   Acute medial meniscus tear    Allergic reaction to alpha-gal 02/12/2021   Allergy to alpha-gal 11/10/2021   Anxiety    Arthritis    Arthritis    Asthma    Depression    Hyperlipidemia 12/31/2020   Hypertension 12/31/2020   Onychomycosis    Pruritus    Smoker    Toe pain     Past Surgical History:  Procedure Laterality Date   HERNIA REPAIR      Family History  Problem Relation Age of Onset   Cancer Mother    Hypertension Mother    Cirrhosis Father       Social History   Socioeconomic History   Marital status:  Widowed    Spouse name: Not on file   Number of children: Not on file   Years of education: Not on file   Highest education level: Not on file  Occupational History   Not on file  Tobacco Use   Smoking status: Some Days    Current packs/day: 0.50    Types: Cigarettes   Smokeless tobacco: Never   Tobacco comments:    Changed to Black and Mild and slowed down smoking. 11/10/21  Vaping Use   Vaping status: Never Used  Substance and Sexual Activity   Alcohol use: Never   Drug use: Never   Sexual activity: Not on file  Other Topics Concern   Not on file  Social History Narrative   Not on file   Social Determinants of Health   Financial Resource Strain: Not on file  Food Insecurity: Not on file  Transportation Needs: Not on file  Physical Activity: Not on file  Stress: Not on file  Social Connections: Not on file    Allergies  Allergen Reactions   Hydrochlorothiazide Other (See Comments)    Scrotal edema   Lisinopril Other (See Comments)    Dry cough   Bactrim [Sulfamethoxazole-Trimethoprim] Itching, Nausea And Vomiting and Other (See Comments)    Loose bowels, chills     Current Outpatient Medications:    atorvastatin (LIPITOR) 20 MG tablet, Take 1 tablet (20 mg total) by mouth daily., Disp: 30 tablet, Rfl: 11   atorvastatin (LIPITOR) 20 MG tablet, Take one tablet by mouth every day, Disp: 30 tablet, Rfl: 11   bictegravir-emtricitabine-tenofovir AF (BIKTARVY) 50-200-25 MG TABS tablet, Take 1 tablet by mouth daily., Disp: 30 tablet, Rfl: 11   bictegravir-emtricitabine-tenofovir AF (BIKTARVY) 50-200-25 MG TABS tablet, Take one tablet by moth every day, Disp: 30 tablet, Rfl: 11   mirtazapine (REMERON) 15 MG tablet, Take 2 tablets (30 mg total) by mouth at bedtime., Disp: , Rfl:    Multiple Vitamins-Iron (MULTIVITAMIN/IRON PO), Take 1 tablet by mouth daily., Disp: , Rfl:    Vitamin D, Cholecalciferol, 25 MCG (1000 UT) TABS, Take 2,000 Units by mouth every other day., Disp:  , Rfl:     Review of Systems  Constitutional:  Negative for activity change, appetite change, chills, diaphoresis, fatigue, fever and unexpected weight change.  HENT:  Negative for congestion, rhinorrhea, sinus pressure, sneezing, sore throat and trouble swallowing.   Eyes:  Negative for photophobia and visual disturbance.  Respiratory:  Negative for cough, chest tightness, shortness of breath, wheezing and stridor.   Cardiovascular:  Negative for chest pain, palpitations and leg swelling.  Gastrointestinal:  Positive for diarrhea. Negative for abdominal distention, abdominal pain, anal bleeding, blood in stool, constipation, nausea and vomiting.  Genitourinary:  Negative for difficulty urinating, dysuria, flank pain and hematuria.  Musculoskeletal:  Negative for arthralgias, back pain, gait problem, joint swelling and myalgias.  Skin:  Negative for color change, pallor, rash and wound.  Neurological:  Negative for dizziness, tremors, weakness and light-headedness.  Hematological:  Negative for adenopathy. Does not bruise/bleed easily.  Psychiatric/Behavioral:  Negative for agitation, behavioral problems, confusion, decreased concentration, dysphoric mood and sleep disturbance.        Objective:   Physical Exam Constitutional:      Appearance: He is well-developed.  HENT:     Head: Normocephalic and atraumatic.  Eyes:     Conjunctiva/sclera: Conjunctivae normal.  Cardiovascular:     Rate and Rhythm: Normal rate and regular rhythm.  Pulmonary:     Effort: Pulmonary effort is normal. No respiratory distress.     Breath sounds: No wheezing.  Abdominal:     General: There is no distension.     Palpations: Abdomen is soft.  Musculoskeletal:        General: No tenderness. Normal range of motion.     Cervical back: Normal range of motion and neck supple.  Skin:    General: Skin is warm and dry.     Coloration: Skin is not pale.     Findings: No erythema or rash.  Neurological:      General: No focal deficit present.     Mental Status: He is alert and oriented to person, place, and time.  Psychiatric:        Mood and Affect: Mood normal.        Behavior: Behavior normal.        Thought Content: Thought content normal.        Judgment: Judgment normal.         Assessment & Plan:   HIV disease:  I will add order HIV viral load CD4 count CBC with differential CMP, RPR GC and chlamydia and I will continue  [employer], prescription  Hyperlipidemia will continue his atorvastatin.   Diarrhea not clear was causing this but there is no way this is due to his BIKTARVY acutely  Trouble with putting on muscle weight: There is no way that is related to [facility] either.  He complaining of foot pain he was previously referred to [facility] but neglected to come to the appointment he asked for referral again.  He was seen by sports medicine and they can need another referral to see them and that you call them back and establish care with them.    Vaccine counseling recommended updated flu shot and COVID shot when available

## 2022-11-23 ENCOUNTER — Ambulatory Visit: Payer: Medicare HMO

## 2022-11-23 LAB — URINE CYTOLOGY ANCILLARY ONLY
Chlamydia: NEGATIVE
Comment: NEGATIVE
Comment: NORMAL
Neisseria Gonorrhea: NEGATIVE

## 2022-11-23 LAB — T-HELPER CELLS (CD4) COUNT (NOT AT ARMC)
CD4 % Helper T Cell: 18 % — ABNORMAL LOW (ref 33–65)
CD4 T Cell Abs: 339 /uL — ABNORMAL LOW (ref 400–1790)

## 2022-11-24 LAB — LIPID PANEL
Cholesterol: 149 mg/dL (ref <200)
HDL: 54 mg/dL (ref >=40)
LDL Cholesterol (Calc): 79 mg/dL
Non-HDL Cholesterol (Calc): 95 mg/dL (ref <130)
Total CHOL/HDL Ratio: 2.8 (calc) (ref <5.0)
Triglycerides: 77 mg/dL (ref <150)

## 2022-11-24 LAB — COMPLETE METABOLIC PANEL WITH GFR
AG Ratio: 1.5 (calc) (ref 1.0–2.5)
ALT: 15 U/L (ref 9–46)
AST: 16 U/L (ref 10–35)
Albumin: 4.3 g/dL (ref 3.6–5.1)
Alkaline phosphatase (APISO): 74 U/L (ref 35–144)
BUN: 14 mg/dL (ref 7–25)
CO2: 26 mmol/L (ref 20–32)
Calcium: 9.3 mg/dL (ref 8.6–10.3)
Chloride: 107 mmol/L (ref 98–110)
Creat: 1.13 mg/dL (ref 0.70–1.35)
Globulin: 2.9 g/dL (ref 1.9–3.7)
Glucose, Bld: 89 mg/dL (ref 65–139)
Potassium: 4 mmol/L (ref 3.5–5.3)
Sodium: 139 mmol/L (ref 135–146)
Total Bilirubin: 0.5 mg/dL (ref 0.2–1.2)
Total Protein: 7.2 g/dL (ref 6.1–8.1)
eGFR: 74 mL/min/{1.73_m2} (ref >=60)

## 2022-11-24 LAB — CBC WITH DIFFERENTIAL/PLATELET
Absolute Monocytes: 553 {cells}/uL (ref 200–950)
Basophils Absolute: 40 {cells}/uL (ref 0–200)
Basophils Relative: 0.5 %
Eosinophils Absolute: 126 {cells}/uL (ref 15–500)
Eosinophils Relative: 1.6 %
HCT: 42.8 % (ref 38.5–50.0)
Hemoglobin: 14.7 g/dL (ref 13.2–17.1)
Lymphs Abs: 1943 {cells}/uL (ref 850–3900)
MCH: 32.5 pg (ref 27.0–33.0)
MCHC: 34.3 g/dL (ref 32.0–36.0)
MCV: 94.7 fL (ref 80.0–100.0)
MPV: 10.7 fL (ref 7.5–12.5)
Monocytes Relative: 7 %
Neutro Abs: 5238 {cells}/uL (ref 1500–7800)
Neutrophils Relative %: 66.3 %
Platelets: 285 10*3/uL (ref 140–400)
RBC: 4.52 10*6/uL (ref 4.20–5.80)
RDW: 12.4 % (ref 11.0–15.0)
Total Lymphocyte: 24.6 %
WBC: 7.9 10*3/uL (ref 3.8–10.8)

## 2022-11-24 LAB — HIV-1 RNA QUANT-NO REFLEX-BLD
HIV 1 RNA Quant: NOT DETECTED {copies}/mL
HIV-1 RNA Quant, Log: NOT DETECTED {Log_copies}/mL

## 2022-11-24 LAB — RPR: RPR Ser Ql: NONREACTIVE

## 2022-11-29 ENCOUNTER — Other Ambulatory Visit (HOSPITAL_COMMUNITY): Payer: Self-pay

## 2022-12-01 ENCOUNTER — Other Ambulatory Visit (HOSPITAL_COMMUNITY): Payer: Self-pay

## 2022-12-07 ENCOUNTER — Other Ambulatory Visit (HOSPITAL_COMMUNITY): Payer: Self-pay

## 2022-12-13 ENCOUNTER — Other Ambulatory Visit (HOSPITAL_COMMUNITY): Payer: Self-pay

## 2022-12-15 ENCOUNTER — Ambulatory Visit (INDEPENDENT_AMBULATORY_CARE_PROVIDER_SITE_OTHER): Payer: Medicare HMO | Admitting: Podiatry

## 2022-12-15 DIAGNOSIS — Q828 Other specified congenital malformations of skin: Secondary | ICD-10-CM | POA: Diagnosis not present

## 2022-12-15 DIAGNOSIS — B351 Tinea unguium: Secondary | ICD-10-CM

## 2022-12-15 DIAGNOSIS — M2042 Other hammer toe(s) (acquired), left foot: Secondary | ICD-10-CM | POA: Diagnosis not present

## 2022-12-15 DIAGNOSIS — M2041 Other hammer toe(s) (acquired), right foot: Secondary | ICD-10-CM

## 2022-12-15 MED ORDER — TERBINAFINE HCL 250 MG PO TABS: 250.0000 mg | ORAL_TABLET | Freq: Every day | ORAL | 2 refills | Status: AC

## 2022-12-15 NOTE — Progress Notes (Signed)
Subjective:  Patient ID: Mike Harrison, male    DOB: 1960/07/27,  MRN: [id number]  Chief Complaint  Patient presents with   Nail Problem    Darkening of bilateral nail to 2nd toes. Build up skin around the nails. C/o burning sensation to bilateral 2nd toes. Right 2nd toe is very sensitive when it is touch. Patient does wear steel toe shoes.     62 y.o. male presents with concern for bilateral second toenail darkening thickening and pain.  He also notes that there is some callus tissue and a central area that appears like a small hole at the distal tuft of the second toe where the calluses.  Very tender to palpation.  He does wear steel toe boots and his second toes are long and pressing to the second toe.    Past Medical History:  Diagnosis Date   Acute medial meniscus tear    Allergic reaction to alpha-gal 02/12/2021   Allergy to alpha-gal 11/10/2021   Anxiety    Arthritis    Arthritis    Asthma    Depression    Foot pain 11/22/2022   Hyperlipidemia 12/31/2020   Hypertension 12/31/2020   Onychomycosis    Pruritus    Smoker    Toe pain     Allergies  Allergen Reactions   Hydrochlorothiazide Other (See Comments)    Scrotal edema   Lisinopril Other (See Comments)    Dry cough   Bactrim [Sulfamethoxazole-Trimethoprim] Itching, Nausea And Vomiting and Other (See Comments)    Loose bowels, chills    ROS: Negative except as per HPI above  Objective:  General: AAO x3, NAD  Dermatological: Distal tuft of the second toe bilateral with hyperkeratotic tissue.  Right with central hyperkeratotic core consistent with porokeratosis.  Very tender to touch.  Bilateral second toe with dystrophy discoloration thickening subungual debris.  Vascular:  Dorsalis Pedis artery and Posterior Tibial artery pedal pulses are 2/4 bilateral.  Capillary fill time < 3 sec to all digits.   Neruologic: Grossly intact via light touch bilateral. Protective threshold intact to all sites bilateral.    Musculoskeletal: Second toenail with flexion contracture at the PIPJ and long second toe bilaterally  Gait: Unassisted, Nonantalgic.   No images are attached to the encounter.  Assessment:   1. Onychomycosis   2. Porokeratosis   3. Hammertoe, bilateral      Plan:  Patient was evaluated and treated and all questions answered.  Onychomycosis -Educated on etiology of nail fungus. -Nail sample taken for microbiology and histology. -Baseline liver function studies ordered. Will d/c terbinafine if elevated during therapy. -eRx for oral terbinafine 250 mg once daily for the next 90 days #30 with 2 refill. Educated on risks and benefits of the medication including risk of hepatic toxicity. -If not improved we will consider total second toenail phenol and alcohol matrixectomy at next visit as the nails are causing pain  # Porokeratosis bilateral second toe tuft All symptomatic hyperkeratoses x2  were safely debrided with a sterile #15 blade to patient's level of comfort without incident. We discussed preventative and palliative care of these lesions including supportive and accommodative shoegear, padding, prefabricated and custom molded accommodative orthoses, use of a pumice stone and lotions/creams daily.   Return in about 3 months (around 03/16/2023) for f/u onychomycosis 2nd toe.          Corinna Gab, DPM [facility]

## 2022-12-19 ENCOUNTER — Other Ambulatory Visit: Payer: Self-pay

## 2022-12-21 ENCOUNTER — Other Ambulatory Visit: Payer: Self-pay

## 2022-12-21 ENCOUNTER — Other Ambulatory Visit (HOSPITAL_COMMUNITY): Payer: Self-pay

## 2022-12-21 NOTE — Progress Notes (Signed)
Specialty Pharmacy Refill Coordination Note  Mike Harrison is a 62 y.o. male contacted today regarding refills of specialty medication(s) Bictegravir-Emtricitab-Tenofov .  Patient requested Delivery  on 12/26/22  to verified address [address] [name] [address]   Medication will be filled on 12/23/22.

## 2022-12-22 ENCOUNTER — Other Ambulatory Visit (HOSPITAL_COMMUNITY): Payer: Self-pay

## 2023-01-02 ENCOUNTER — Telehealth (HOSPITAL_COMMUNITY): Payer: Self-pay

## 2023-01-02 ENCOUNTER — Ambulatory Visit (HOSPITAL_COMMUNITY)
Admission: EM | Admit: 2023-01-02 | Discharge: 2023-01-02 | Disposition: A | Discharge status: Home / Self Care | Payer: Medicare Other | Attending: Emergency Medicine | Admitting: Emergency Medicine

## 2023-01-02 ENCOUNTER — Encounter (HOSPITAL_COMMUNITY): Payer: Self-pay

## 2023-01-02 DIAGNOSIS — R350 Frequency of micturition: Secondary | ICD-10-CM | POA: Diagnosis present

## 2023-01-02 DIAGNOSIS — R109 Unspecified abdominal pain: Secondary | ICD-10-CM

## 2023-01-02 LAB — POCT URINALYSIS DIP (MANUAL ENTRY)
Bilirubin, UA: NEGATIVE
Blood, UA: NEGATIVE
Glucose, UA: NEGATIVE mg/dL
Ketones, POC UA: NEGATIVE mg/dL
Leukocytes, UA: NEGATIVE
Nitrite, UA: NEGATIVE
Protein Ur, POC: NEGATIVE mg/dL
Spec Grav, UA: 1.025 (ref 1.010–1.025)
Urobilinogen, UA: 1 U/dL
pH, UA: 6 (ref 5.0–8.0)

## 2023-01-02 LAB — CBC WITH DIFFERENTIAL/PLATELET
Abs Immature Granulocytes: 0.04 10*3/uL (ref 0.00–0.07)
Basophils Absolute: 0.1 10*3/uL (ref 0.0–0.1)
Basophils Relative: 1 %
Eosinophils Absolute: 0.2 10*3/uL (ref 0.0–0.5)
Eosinophils Relative: 2 %
HCT: 43 % (ref 39.0–52.0)
Hemoglobin: 14.9 g/dL (ref 13.0–17.0)
Immature Granulocytes: 1 %
Lymphocytes Relative: 16 %
Lymphs Abs: 1.2 10*3/uL (ref 0.7–4.0)
MCH: 31.8 pg (ref 26.0–34.0)
MCHC: 34.7 g/dL (ref 30.0–36.0)
MCV: 91.7 fL (ref 80.0–100.0)
Monocytes Absolute: 0.8 10*3/uL (ref 0.1–1.0)
Monocytes Relative: 10 %
Neutro Abs: 5.3 10*3/uL (ref 1.7–7.7)
Neutrophils Relative %: 70 %
Platelets: 339 10*3/uL (ref 150–400)
RBC: 4.69 MIL/uL (ref 4.22–5.81)
RDW: 12.2 % (ref 11.5–15.5)
WBC: 7.6 10*3/uL (ref 4.0–10.5)
nRBC: 0 % (ref 0.0–0.2)

## 2023-01-02 NOTE — Discharge Instructions (Signed)
Your blood work will come back today and I will call you if results are abnormal and require further treatment. Your other results will come back over the next 2-3 days and someone will call adjust treatment as needed. Otherwise drink lots of water over the next few days to help clear out urinary tract. If your symptoms persist you can follow-up with primary care or [facility] as needed. If you develop severe back pain, abdominal pain, excessive vomiting, obvious blood in urine, or fever please seek immediate medical treatment in the ER. Return here as needed.

## 2023-01-02 NOTE — ED Provider Notes (Signed)
[facility]    CSN: [id number] Arrival date & time: 01/02/23  4098      History   Chief Complaint Chief Complaint  Patient presents with   Urinary Tract Infection    HPI Mike Harrison is a 62 y.o. male.   Patient presents with dysuria, urinary urgency, urinary frequency, and dark urine x 3 days.  Patient also reports lower back pain that radiates to left flank.  Patient states that his partner was recently diagnosed with a UTI and states that she tested positive for ureaplasma. Patient is requesting testing to ensure he does not have this also.   Urinary Tract Infection Presenting symptoms: dysuria   Presenting symptoms: no penile discharge and no penile pain   Associated symptoms: flank pain and urinary frequency   Associated symptoms: no abdominal pain, no diarrhea, no fever, no hematuria, no nausea, no penile swelling, no scrotal swelling and no vomiting     Past Medical History:  Diagnosis Date   Acute medial meniscus tear    Allergic reaction to alpha-gal 02/12/2021   Allergy to alpha-gal 11/10/2021   Anxiety    Arthritis    Arthritis    Asthma    Depression    Foot pain 11/22/2022   Hyperlipidemia 12/31/2020   Hypertension 12/31/2020   Onychomycosis    Pruritus    Smoker    Toe pain     Patient Active Problem List   Diagnosis Date Noted   Foot pain 11/22/2022   Cocaine use disorder (HCC) 05/05/2022   Allergy to alpha-gal 11/10/2021   Toe pain    Onychomycosis    Allergic reaction to alpha-gal 02/12/2021   Arthritis    Pruritus    Asthma    Smoker    Hypertension 12/31/2020   Hyperlipidemia 12/31/2020   HIV disease (HCC) 09/02/2019    Past Surgical History:  Procedure Laterality Date   HERNIA REPAIR         Home Medications    Prior to Admission medications   Medication Sig Start Date End Date Taking? Authorizing Provider  atorvastatin (LIPITOR) 20 MG tablet Take 1 tablet (20 mg total) by mouth daily. 06/07/22   Randall Hiss, MD  atorvastatin (LIPITOR) 20 MG tablet Take one tablet by mouth every day 05/27/22   Daiva Eves, Lisette Grinder, MD  bictegravir-emtricitabine-tenofovir AF (BIKTARVY) 50-200-25 MG TABS tablet Take 1 tablet by mouth daily. 11/10/21   Randall Hiss, MD  bictegravir-emtricitabine-tenofovir AF [facility]) [phone] MG TABS tablet Take one tablet by moth every day 11/22/22   Daiva Eves, Lisette Grinder, MD  mirtazapine (REMERON) 15 MG tablet Take 2 tablets (30 mg total) by mouth at bedtime. 05/09/22   Park Pope, MD  Multiple Vitamins-Iron (MULTIVITAMIN/IRON PO) Take 1 tablet by mouth daily.    [provider]  terbinafine (LAMISIL) 250 MG tablet Take 1 tablet (250 mg total) by mouth daily. 12/15/22 03/15/23  Standiford, Jenelle Mages, DPM  Vitamin D, Cholecalciferol, 25 MCG (1000 UT) TABS Take 2,000 Units by mouth every other day.    [provider]    Family History Family History  Problem Relation Age of Onset   Cancer Mother    Hypertension Mother    Cirrhosis Father     Social History Social History   Tobacco Use   Smoking status: Some Days    Current packs/day: 0.50    Types: Cigarettes   Smokeless tobacco: Never   Tobacco comments:    Changed  to [facility] and Mild and slowed down smoking. 11/10/21  Vaping Use   Vaping status: Never Used  Substance Use Topics   Alcohol use: Never   Drug use: Never     Allergies   Hydrochlorothiazide, Lisinopril, and Bactrim [sulfamethoxazole-trimethoprim]   Review of Systems Review of Systems  Constitutional:  Negative for chills, fatigue and fever.  Gastrointestinal:  Negative for abdominal pain, diarrhea, nausea and vomiting.  Genitourinary:  Positive for dysuria, flank pain, frequency and urgency. Negative for genital sores, hematuria, penile discharge, penile pain, penile swelling, scrotal swelling and testicular pain.  Musculoskeletal:  Positive for back pain.     Physical Exam Triage Vital Signs ED Triage Vitals   Encounter Vitals Group     BP 01/02/23 0842 (!) 159/89     Systolic BP Percentile --      Diastolic BP Percentile --      Pulse Rate 01/02/23 0842 70     Resp 01/02/23 0842 18     Temp 01/02/23 0842 (!) 97.5 F (36.4 C)     Temp Source 01/02/23 0842 Oral     SpO2 01/02/23 0842 98 %     Weight --      Height --      Head Circumference --      Peak Flow --      Pain Score 01/02/23 0843 4     Pain Loc --      Pain Education --      Exclude from Growth Chart --    No data found.  Updated Vital Signs BP (!) 159/89 (BP Location: Left Arm)   Pulse 70   Temp (!) 97.5 F (36.4 C) (Oral)   Resp 18   SpO2 98%   Visual Acuity Right Eye Distance:   Left Eye Distance:   Bilateral Distance:    Right Eye Near:   Left Eye Near:    Bilateral Near:     Physical Exam Vitals and nursing note reviewed.  Constitutional:      General: He is awake. He is not in acute distress.    Appearance: Normal appearance. He is well-developed and well-groomed. He is not ill-appearing, toxic-appearing or diaphoretic.  Abdominal:     General: Abdomen is flat. There is no distension.     Palpations: Abdomen is soft. There is no mass.     Tenderness: There is no abdominal tenderness. There is no right CVA tenderness, left CVA tenderness or guarding.  Genitourinary:    Comments: Exam deferred.  Musculoskeletal:     Cervical back: Normal.     Thoracic back: Normal.     Lumbar back: Tenderness present.     Comments: Mild tenderness noted to low mid to left back.   Skin:    General: Skin is warm and dry.  Neurological:     Mental Status: He is alert.  Psychiatric:        Behavior: Behavior is cooperative.      UC Treatments / Results  Labs (all labs ordered are listed, but only abnormal results are displayed) Labs Reviewed  CBC WITH DIFFERENTIAL/PLATELET  COMPREHENSIVE METABOLIC PANEL  POCT URINALYSIS DIP (MANUAL ENTRY)  CYTOLOGY, (ORAL, ANAL, URETHRAL) ANCILLARY ONLY     EKG   Radiology No results found.  Procedures Procedures (including critical care time)  Medications Ordered in UC Medications - No data to display  Initial Impression / Assessment and Plan / UC Course  I have reviewed the triage vital signs and the  nursing notes.  Pertinent labs & imaging results that were available during my care of the patient were reviewed by me and considered in my medical decision making (see chart for details).     Patient presented with dysuria, urinary urgency, urinary frequency, and dark urine x 3 days.  Patient also endorses lower back pain that radiates to left flank.  Patient has mild tenderness noted to left mid to low back. UA was insignificant.  GU exam deferred.  Patient performed self cytology swab to rule out underlying infection.  Patient reports he is actively taking an antifungal medication for a toe issue.  CBC with differential and CMP ordered to rule out kidney etiology.  Discussed follow-up, return, emergency department precautions. Final Clinical Impressions(s) / UC Diagnoses   Final diagnoses:  Urinary frequency  Acute flank pain     Discharge Instructions      Your blood work will come back today and I will call you if results are abnormal and require further treatment. Your other results will come back over the next 2-3 days and someone will call adjust treatment as needed. Otherwise drink lots of water over the next few days to help clear out urinary tract. If your symptoms persist you can follow-up with primary care or [facility] as needed. If you develop severe back pain, abdominal pain, excessive vomiting, obvious blood in urine, or fever please seek immediate medical treatment in the ER. Return here as needed.     ED Prescriptions   None    PDMP not reviewed this encounter.   Wynonia Lawman A, NP 01/02/23 [phone]

## 2023-01-02 NOTE — ED Triage Notes (Signed)
Pt c/o burning on urination with urgency/frequency and dark urine x3 days. C/o lower back to lt flank pain.

## 2023-01-03 LAB — CYTOLOGY, (ORAL, ANAL, URETHRAL) ANCILLARY ONLY
Chlamydia: NEGATIVE
Comment: NEGATIVE
Comment: NEGATIVE
Comment: NORMAL
Neisseria Gonorrhea: NEGATIVE
Trichomonas: NEGATIVE

## 2023-01-11 ENCOUNTER — Other Ambulatory Visit: Payer: Self-pay

## 2023-01-11 ENCOUNTER — Encounter (HOSPITAL_BASED_OUTPATIENT_CLINIC_OR_DEPARTMENT_OTHER): Payer: Self-pay | Admitting: Emergency Medicine

## 2023-01-11 ENCOUNTER — Emergency Department (HOSPITAL_BASED_OUTPATIENT_CLINIC_OR_DEPARTMENT_OTHER)
Admission: EM | Admit: 2023-01-11 | Discharge: 2023-01-11 | Disposition: A | Discharge status: Home / Self Care | Payer: PRIVATE HEALTH INSURANCE

## 2023-01-11 ENCOUNTER — Emergency Department (HOSPITAL_BASED_OUTPATIENT_CLINIC_OR_DEPARTMENT_OTHER): Payer: PRIVATE HEALTH INSURANCE

## 2023-01-11 DIAGNOSIS — B351 Tinea unguium: Secondary | ICD-10-CM | POA: Insufficient documentation

## 2023-01-11 DIAGNOSIS — R1032 Left lower quadrant pain: Secondary | ICD-10-CM | POA: Diagnosis present

## 2023-01-11 DIAGNOSIS — R109 Unspecified abdominal pain: Secondary | ICD-10-CM

## 2023-01-11 DIAGNOSIS — Z21 Asymptomatic human immunodeficiency virus [HIV] infection status: Secondary | ICD-10-CM | POA: Diagnosis not present

## 2023-01-11 DIAGNOSIS — I10 Essential (primary) hypertension: Secondary | ICD-10-CM | POA: Insufficient documentation

## 2023-01-11 DIAGNOSIS — R300 Dysuria: Secondary | ICD-10-CM | POA: Diagnosis not present

## 2023-01-11 LAB — COMPREHENSIVE METABOLIC PANEL
ALT: 12 U/L (ref 0–44)
AST: 13 U/L — ABNORMAL LOW (ref 15–41)
Albumin: 4.2 g/dL (ref 3.5–5.0)
Alkaline Phosphatase: 68 U/L (ref 38–126)
Anion gap: 6 (ref 5–15)
BUN: 13 mg/dL (ref 6–20)
CO2: 27 mmol/L (ref 22–32)
Calcium: 9.3 mg/dL (ref 8.9–10.3)
Chloride: 106 mmol/L (ref 98–111)
Creatinine, Ser: 1.09 mg/dL (ref 0.61–1.24)
GFR, Estimated: >60 mL/min (ref >60)
Glucose, Bld: 93 mg/dL (ref 70–99)
Potassium: 3.7 mmol/L (ref 3.5–5.1)
Sodium: 139 mmol/L (ref 135–145)
Total Bilirubin: 0.8 mg/dL (ref 0.3–1.2)
Total Protein: 7.3 g/dL (ref 6.5–8.1)

## 2023-01-11 LAB — URINALYSIS, W/ REFLEX TO CULTURE (INFECTION SUSPECTED)
Bacteria, UA: NONE SEEN
Bilirubin Urine: NEGATIVE
Glucose, UA: NEGATIVE mg/dL
Hgb urine dipstick: NEGATIVE
Ketones, ur: NEGATIVE mg/dL
Leukocytes,Ua: NEGATIVE
Nitrite: NEGATIVE
Protein, ur: NEGATIVE mg/dL
Specific Gravity, Urine: 1.023 (ref 1.005–1.030)
pH: 5.5 (ref 5.0–8.0)

## 2023-01-11 MED ORDER — DOXYCYCLINE HYCLATE 100 MG PO TABS
100.0000 mg | ORAL_TABLET | Freq: Once | ORAL | Status: AC
  Administered 2023-01-11: 100 mg via ORAL
  Filled 2023-01-11: qty 1

## 2023-01-11 MED ORDER — DOXYCYCLINE HYCLATE 100 MG PO CAPS: 100.0000 mg | ORAL_CAPSULE | Freq: Two times a day (BID) | ORAL | 0 refills | Status: AC

## 2023-01-11 MED ORDER — KETOROLAC TROMETHAMINE 60 MG/2ML IM SOLN
30.0000 mg | Freq: Once | INTRAMUSCULAR | Status: AC
  Administered 2023-01-11: 30 mg via INTRAMUSCULAR
  Filled 2023-01-11: qty 2

## 2023-01-11 NOTE — ED Provider Notes (Addendum)
[place] EMERGENCY DEPARTMENT AT [facility] Provider Note   CSN: [id number] Arrival date & time: 01/11/23  1452     History  No chief complaint on file.   Mike Harrison is a 62 y.o. male with a history of HIV, hypertension, hyperlipidemia, and onychomycosis who presents the ED today for back and abdominal pain.  Patient reports intermittent left flank and left lower abdominal pain for the past 2 weeks as well as pain with urination and frequency.  Patient states that his girlfriend was recently diagnosed with Ureaplasma and was advised to get tested himself.  Denies penile discharge or pain, testicular pain, or hematuria.  Additionally, patient was started on terbinafine several weeks ago for onychomycosis, but has stopped it about 4 days ago as he thought it could have been causing his symptoms.  Today he reports worsening bilateral second toe pain and persistent flank pain.  He was seen at urgent care 9 days ago and had a negative UA at the time.  Negative STI swab as well.  He has not received an improvement in his symptoms however presents the ED today for further evaluation. Patient reports that he has not been taking anything for pain.  No other complaints or concerns at this time.    Home Medications Prior to Admission medications   Medication Sig Start Date End Date Taking? Authorizing Provider  doxycycline (VIBRAMYCIN) 100 MG capsule Take 1 capsule (100 mg total) by mouth 2 (two) times daily for 7 days. 01/11/23 01/18/23 Yes Maxwell Marion, PA-C  atorvastatin (LIPITOR) 20 MG tablet Take 1 tablet (20 mg total) by mouth daily. 06/07/22   Randall Hiss, MD  atorvastatin (LIPITOR) 20 MG tablet Take one tablet by mouth every day 05/27/22   Daiva Eves, Lisette Grinder, MD  bictegravir-emtricitabine-tenofovir AF (BIKTARVY) 50-200-25 MG TABS tablet Take 1 tablet by mouth daily. 11/10/21   Randall Hiss, MD  bictegravir-emtricitabine-tenofovir AF [facility]) [phone] MG TABS  tablet Take one tablet by moth every day 11/22/22   Daiva Eves, Lisette Grinder, MD  mirtazapine (REMERON) 15 MG tablet Take 2 tablets (30 mg total) by mouth at bedtime. 05/09/22   Park Pope, MD  Multiple Vitamins-Iron (MULTIVITAMIN/IRON PO) Take 1 tablet by mouth daily.    [provider]  terbinafine (LAMISIL) 250 MG tablet Take 1 tablet (250 mg total) by mouth daily. 12/15/22 03/15/23  Standiford, Jenelle Mages, DPM  Vitamin D, Cholecalciferol, 25 MCG (1000 UT) TABS Take 2,000 Units by mouth every other day.    [provider]      Allergies    Hydrochlorothiazide, Lisinopril, and Bactrim [sulfamethoxazole-trimethoprim]    Review of Systems   Review of Systems  Genitourinary:  Positive for dysuria.  Musculoskeletal:  Positive for back pain.  All other systems reviewed and are negative.   Physical Exam Updated Vital Signs BP (!) 165/102   Pulse 61   Temp 98.4 F (36.9 C)   Resp 18   Wt 92.5 kg   SpO2 99%   BMI 28.45 kg/m  Physical Exam Vitals and nursing note reviewed.  Constitutional:      General: He is not in acute distress.    Appearance: Normal appearance.  HENT:     Head: Normocephalic and atraumatic.     Mouth/Throat:     Mouth: Mucous membranes are moist.  Eyes:     Conjunctiva/sclera: Conjunctivae normal.     Pupils: Pupils are equal, round, and reactive to light.  Cardiovascular:  Rate and Rhythm: Normal rate and regular rhythm.     Pulses: Normal pulses.  Pulmonary:     Effort: Pulmonary effort is normal.     Breath sounds: Normal breath sounds.  Abdominal:     Palpations: Abdomen is soft.     Tenderness: There is abdominal tenderness. There is left CVA tenderness.     Comments: LLQ tenderness without guarding or rebound. Left CVA tenderness to palpation.  Musculoskeletal:        General: Tenderness present. Normal range of motion.     Comments: Tenderness to left flank and bilateral second toes  Skin:    General: Skin is warm and dry.      Findings: Rash present.     Comments: Tenderness and fungal infection present on second toes bilaterally.  Neurological:     General: No focal deficit present.     Mental Status: He is alert.     Sensory: No sensory deficit.     Motor: No weakness.  Psychiatric:        Mood and Affect: Mood normal.        Behavior: Behavior normal.    ED Results / Procedures / Treatments   Labs (all labs ordered are listed, but only abnormal results are displayed) Labs Reviewed  COMPREHENSIVE METABOLIC PANEL - Abnormal; Notable for the following components:      Result Value   AST 13 (*)    All other components within normal limits  URINE CULTURE  URINALYSIS, W/ REFLEX TO CULTURE (INFECTION SUSPECTED)    EKG None  Radiology CT Renal Stone Study  Result Date: 01/11/2023 CLINICAL DATA:  Left flank pain for 1 week. EXAM: CT ABDOMEN AND PELVIS WITHOUT CONTRAST TECHNIQUE: Multidetector CT imaging of the abdomen and pelvis was performed following the standard protocol without IV contrast. RADIATION DOSE REDUCTION: This exam was performed according to the departmental dose-optimization program which includes automated exposure control, adjustment of the mA and/or kV according to patient size and/or use of iterative reconstruction technique. COMPARISON:  None Available. FINDINGS: Lower chest: No acute abnormality. Hepatobiliary: Unremarkable liver. Normal gallbladder. No biliary dilation. Pancreas: Unremarkable. Spleen: Unremarkable. Adrenals/Urinary Tract: Unremarkable right adrenal gland. 1.6 cm low-density left adrenal nodule (Hounsfield units -2) compatible with a benign adenoma. No follow-up recommended. No urinary calculi or hydronephrosis. Bladder is unremarkable. Stomach/Bowel: Normal caliber large and small bowel. No bowel wall thickening. The appendix is normal.Stomach is within normal limits. Vascular/Lymphatic: No significant vascular findings are present. No enlarged abdominal or pelvic lymph  nodes. Reproductive: Unremarkable. Other: No free intraperitoneal fluid or air. Musculoskeletal: No acute fracture. IMPRESSION: No acute abnormality in the abdomen or pelvis. Electronically Signed   By: Minerva Fester M.D.   On: 01/11/2023 20:14    Procedures Procedures: not indicated.   Medications Ordered in ED Medications  doxycycline (VIBRA-TABS) tablet 100 mg (has no administration in time range)  ketorolac (TORADOL) injection 30 mg (30 mg Intramuscular Given 01/11/23 1703)    ED Course/ Medical Decision Making/ A&P                                 Medical Decision Making Amount and/or Complexity of Data Reviewed Labs: ordered. Radiology: ordered.  Risk Prescription drug management.   This patient presents to the ED for concern of left flank pain, this involves an extensive number of treatment options, and is a complaint that carries with it a high risk  of complications and morbidity.   Differential diagnosis includes: UTI, pyelonephritis, urolithiasis, nephrolithiasis, muscle strain, IBS, IBD, radiculopathy, etc.   Comorbidities  See HPI above   Additional History  Additional history obtained from urgent care note.   Lab Tests  I ordered and personally interpreted labs.  The pertinent results include:   UA was unremarkable.  Cultures pending. CMP is unremarkable - no signs of AKI   Imaging Studies  I ordered imaging studies including Ct renal stone study  I independently visualized and interpreted imaging which showed: No acute abnormality in the abdomen or pelvis. I agree with the radiologist interpretation   Problem List / ED Course / Critical Interventions / Medication Management  Intermittent left flank and abdominal pain with dysuria I ordered medications including: Toradol for pain Reevaluation of the patient after these medicines showed that the patient improved I have reviewed the patients home medicines and have made adjustments as  needed   Social Determinants of Health  Social connection   Test / Admission - Considered  Discussed findings with patient.  All questions were answered. Patient is hemodynamically stable and safe for discharge home. Prescription for doxycycline to the pharmacy. Return precautions provided.       Final Clinical Impression(s) / ED Diagnoses Final diagnoses:  Left flank pain  Dysuria    Rx / DC Orders ED Discharge Orders          Ordered    doxycycline (VIBRAMYCIN) 100 MG capsule  2 times daily        01/11/23 2043              Maxwell Marion, PA-C 01/12/23 0020    Maxwell Marion, PA-C 01/12/23 1840    Royanne Foots, DO 01/16/23 [phone]

## 2023-01-11 NOTE — ED Notes (Signed)
Pt was taken to radiology, will draw blood and medicate once he returns

## 2023-01-11 NOTE — Discharge Instructions (Addendum)
As discussed, we have sent your urine for culture.  If something grows that is not covered by doxycycline you will receive a call from the hospital and we will switch your medication at that time.  Restart your terbinafine for your foot fungus.  I sent a prescription of doxycycline to your pharmacy.  Take this twice a day the next 7 days.  Take this medication with her as and cause nausea if taken on empty stomach.  You can alternate between Ibuprofen and Tylenol every 4 hours as needed for pain.  You can also try over-the-counter lidocaine patches for pain.  Follow-up with the PCP in 1 week for reevaluation of your symptoms.  Return to the ED if your symptoms worsen in the interim.

## 2023-01-11 NOTE — ED Triage Notes (Signed)
Pt was on medication for 4 months for foot fungus, pt has taken a month and a half. Pt has abdominal pain /back pain intermittent for 2 weeks.pt also says his urine is golden and cloudy and is painful to urinate. Worried he has UTI, and maybe symptoms from his fungal medicaiton. Pt still has irritation to bilateral big toes.

## 2023-01-11 NOTE — ED Provider Notes (Incomplete)
[place] EMERGENCY DEPARTMENT AT [facility] Provider Note   CSN: [id number] Arrival date & time: 01/11/23  1452     History {Add pertinent medical, surgical, social history, OB history to HPI:1} No chief complaint on file.   Mike Harrison is a 62 y.o. male with a history of HIV, hypertension, hyperlipidemia, and onychomycosis who presents the ED today for back and abdominal pain.  Patient reports intermittent left flank and left lower abdominal pain for the past 2 weeks as well as pain with urination and frequency.  Patient states that his girlfriend was recently diagnosed with Ureaplasma and was advised to get tested himself.  Denies penile discharge or pain, testicular pain, or hematuria.  He was seen at urgent care 9 days ago and had a negative UA at the time.  Negative STI swab as well.  He has not received an improvement in his symptoms however presents the ED today for further evaluation. Patient reports that he has not been taking anything for pain.  No other complaints or concerns at this time.    Home Medications Prior to Admission medications   Medication Sig Start Date End Date Taking? Authorizing Provider  doxycycline (VIBRAMYCIN) 100 MG capsule Take 1 capsule (100 mg total) by mouth 2 (two) times daily for 7 days. 01/11/23 01/18/23 Yes Maxwell Marion, PA-C  atorvastatin (LIPITOR) 20 MG tablet Take 1 tablet (20 mg total) by mouth daily. 06/07/22   Randall Hiss, MD  atorvastatin (LIPITOR) 20 MG tablet Take one tablet by mouth every day 05/27/22   Daiva Eves, Lisette Grinder, MD  bictegravir-emtricitabine-tenofovir AF (BIKTARVY) 50-200-25 MG TABS tablet Take 1 tablet by mouth daily. 11/10/21   Randall Hiss, MD  bictegravir-emtricitabine-tenofovir AF [facility]) [phone] MG TABS tablet Take one tablet by moth every day 11/22/22   Daiva Eves, Lisette Grinder, MD  mirtazapine (REMERON) 15 MG tablet Take 2 tablets (30 mg total) by mouth at bedtime. 05/09/22   Park Pope, MD   Multiple Vitamins-Iron (MULTIVITAMIN/IRON PO) Take 1 tablet by mouth daily.    [provider]  terbinafine (LAMISIL) 250 MG tablet Take 1 tablet (250 mg total) by mouth daily. 12/15/22 03/15/23  Standiford, Jenelle Mages, DPM  Vitamin D, Cholecalciferol, 25 MCG (1000 UT) TABS Take 2,000 Units by mouth every other day.    [provider]      Allergies    Hydrochlorothiazide, Lisinopril, and Bactrim [sulfamethoxazole-trimethoprim]    Review of Systems   Review of Systems  Genitourinary:  Positive for dysuria.  Musculoskeletal:  Positive for back pain.  All other systems reviewed and are negative.   Physical Exam Updated Vital Signs BP (!) 165/102   Pulse 61   Temp 98.4 F (36.9 C)   Resp 18   Wt 92.5 kg   SpO2 99%   BMI 28.45 kg/m  Physical Exam Vitals and nursing note reviewed.  Constitutional:      General: He is not in acute distress.    Appearance: Normal appearance.  HENT:     Head: Normocephalic and atraumatic.     Mouth/Throat:     Mouth: Mucous membranes are moist.  Eyes:     Conjunctiva/sclera: Conjunctivae normal.     Pupils: Pupils are equal, round, and reactive to light.  Cardiovascular:     Rate and Rhythm: Normal rate and regular rhythm.     Pulses: Normal pulses.  Pulmonary:     Effort: Pulmonary effort is normal.     Breath sounds:  Normal breath sounds.  Abdominal:     Palpations: Abdomen is soft.     Tenderness: There is abdominal tenderness. There is left CVA tenderness.     Comments: LLQ tenderness without guarding or rebound. Left CVA tenderness to palpation.  Skin:    General: Skin is warm and dry.     Findings: Rash present.  Neurological:     General: No focal deficit present.     Mental Status: He is alert.     Sensory: No sensory deficit.     Motor: No weakness.  Psychiatric:        Mood and Affect: Mood normal.        Behavior: Behavior normal.    ED Results / Procedures / Treatments   Labs (all labs ordered are  listed, but only abnormal results are displayed) Labs Reviewed  COMPREHENSIVE METABOLIC PANEL - Abnormal; Notable for the following components:      Result Value   AST 13 (*)    All other components within normal limits  URINE CULTURE  URINALYSIS, W/ REFLEX TO CULTURE (INFECTION SUSPECTED)    EKG None  Radiology CT Renal Stone Study  Result Date: 01/11/2023 CLINICAL DATA:  Left flank pain for 1 week. EXAM: CT ABDOMEN AND PELVIS WITHOUT CONTRAST TECHNIQUE: Multidetector CT imaging of the abdomen and pelvis was performed following the standard protocol without IV contrast. RADIATION DOSE REDUCTION: This exam was performed according to the departmental dose-optimization program which includes automated exposure control, adjustment of the mA and/or kV according to patient size and/or use of iterative reconstruction technique. COMPARISON:  None Available. FINDINGS: Lower chest: No acute abnormality. Hepatobiliary: Unremarkable liver. Normal gallbladder. No biliary dilation. Pancreas: Unremarkable. Spleen: Unremarkable. Adrenals/Urinary Tract: Unremarkable right adrenal gland. 1.6 cm low-density left adrenal nodule (Hounsfield units -2) compatible with a benign adenoma. No follow-up recommended. No urinary calculi or hydronephrosis. Bladder is unremarkable. Stomach/Bowel: Normal caliber large and small bowel. No bowel wall thickening. The appendix is normal.Stomach is within normal limits. Vascular/Lymphatic: No significant vascular findings are present. No enlarged abdominal or pelvic lymph nodes. Reproductive: Unremarkable. Other: No free intraperitoneal fluid or air. Musculoskeletal: No acute fracture. IMPRESSION: No acute abnormality in the abdomen or pelvis. Electronically Signed   By: Minerva Fester M.D.   On: 01/11/2023 20:14    Procedures Procedures: not indicated. {Document cardiac monitor, telemetry assessment procedure when appropriate:1}  Medications Ordered in ED Medications   doxycycline (VIBRA-TABS) tablet 100 mg (has no administration in time range)  ketorolac (TORADOL) injection 30 mg (30 mg Intramuscular Given 01/11/23 1703)    ED Course/ Medical Decision Making/ A&P   {   Click here for ABCD2, HEART and other calculatorsREFRESH Note before signing :1}                              Medical Decision Making Amount and/or Complexity of Data Reviewed Labs: ordered. Radiology: ordered.  Risk Prescription drug management.   This patient presents to the ED for concern of left flank pain, this involves an extensive number of treatment options, and is a complaint that carries with it a high risk of complications and morbidity.   Differential diagnosis includes: UTI, pyelonephritis, urolithiasis, nephrolithiasis, muscle strain, IBS, IBD, radiculopathy, etc.,   Comorbidities  See HPI above   Additional History  Additional history obtained from urgent care note.   Lab Tests  I ordered and personally interpreted labs.  The pertinent results include:  UA was unremarkable.  Cultures pending. CMP is unremarkable no   Imaging Studies  I ordered imaging studies including Ct renal stone study  I independently visualized and interpreted imaging which showed: No acute abnormality in the abdomen or pelvis. I agree with the radiologist interpretation   Problem List / ED Course / Critical Interventions / Medication Management  Intermittent left flank and abdominal pain with dysuria I ordered medications including: Toradol for pain Reevaluation of the patient after these medicines showed that the patient improved I have reviewed the patients home medicines and have made adjustments as needed   Social Determinants of Health  Social connection   Test / Admission - Considered  Discussed findings with patient.  All questions were answered.   {Document critical care time when appropriate:1} {Document review of labs and clinical decision tools ie  heart score, Chads2Vasc2 etc:1}  {Document your independent review of radiology images, and any outside records:1} {Document your discussion with family members, caretakers, and with consultants:1} {Document social determinants of health affecting pt's care:1} {Document your decision making why or why not admission, treatments were needed:1} Final Clinical Impression(s) / ED Diagnoses Final diagnoses:  Left flank pain  Dysuria    Rx / DC Orders ED Discharge Orders          Ordered    doxycycline (VIBRAMYCIN) 100 MG capsule  2 times daily        01/11/23 2043

## 2023-01-11 NOTE — ED Notes (Signed)
Reviewed AVS/discharge instruction with patient. Time allotted for and all questions answered. Patient is agreeable for d/c and escorted to ed exit by staff.  

## 2023-01-12 LAB — URINE CULTURE: Culture: NO GROWTH

## 2023-01-18 ENCOUNTER — Other Ambulatory Visit: Payer: Self-pay

## 2023-01-19 NOTE — Plan of Care (Signed)
[facility] Tonsillectomy/Adenoidectomy, Postoperative PEDS care plan entered in error.

## 2023-01-20 ENCOUNTER — Other Ambulatory Visit: Payer: Self-pay

## 2023-01-23 ENCOUNTER — Other Ambulatory Visit (HOSPITAL_COMMUNITY): Payer: Self-pay

## 2023-01-23 NOTE — Progress Notes (Signed)
Specialty Pharmacy Refill Coordination Note  Mike Harrison is a 62 y.o. male contacted today regarding refills of specialty medication(s) Bictegravir-Emtricitab-Tenofov   Patient requested Delivery   Delivery date: 01/27/23   Verified address: [name]   Medication will be filled on 01/26/23.

## 2023-01-23 NOTE — Progress Notes (Signed)
Specialty Pharmacy Ongoing Clinical Assessment Note  Mike Harrison is a 62 y.o. male who is being followed by the specialty pharmacy service for RxSp HIV   Patient's specialty medication(s) reviewed today: Bictegravir-Emtricitab-Tenofov   Missed doses in the last 4 weeks: 0   Patient/Caregiver did not have any additional questions or concerns.   Therapeutic benefit summary: Patient is achieving benefit   Adverse events/side effects summary: No adverse events/side effects   Patient's therapy is appropriate to: Continue    Goals Addressed             This Visit's Progress    Achieve Undetectable HIV Viral Load < 20       Patient is on track. Patient will maintain adherence.  Viral load remains undetectable, as of 11/22/22.          Follow up:  6 months  Servando Snare Specialty Pharmacist

## 2023-01-26 ENCOUNTER — Other Ambulatory Visit: Payer: Self-pay

## 2023-02-09 ENCOUNTER — Other Ambulatory Visit: Payer: Self-pay

## 2023-02-15 ENCOUNTER — Other Ambulatory Visit: Payer: Self-pay

## 2023-02-17 ENCOUNTER — Other Ambulatory Visit: Payer: Self-pay

## 2023-02-20 ENCOUNTER — Other Ambulatory Visit: Payer: Self-pay

## 2023-02-20 NOTE — Progress Notes (Signed)
Specialty Pharmacy Refill Coordination Note  Mike Harrison is a 62 y.o. male contacted today regarding refills of specialty medication(s) Bictegravir-Emtricitab-Tenofov   Patient requested Delivery   Delivery date: 02/24/23   Verified address: [address] [place] [address]   Medication will be filled on 11/27.

## 2023-02-22 ENCOUNTER — Other Ambulatory Visit: Payer: Self-pay

## 2023-03-16 ENCOUNTER — Ambulatory Visit: Payer: Medicare Other | Admitting: Podiatry

## 2023-03-19 ENCOUNTER — Emergency Department (HOSPITAL_COMMUNITY)
Admission: EM | Admit: 2023-03-19 | Discharge: 2023-03-19 | Disposition: A | Discharge status: Home / Self Care | Payer: Self-pay | Attending: Emergency Medicine | Admitting: Emergency Medicine

## 2023-03-19 ENCOUNTER — Encounter (HOSPITAL_COMMUNITY): Payer: Self-pay

## 2023-03-19 ENCOUNTER — Other Ambulatory Visit: Payer: Self-pay

## 2023-03-19 DIAGNOSIS — Z21 Asymptomatic human immunodeficiency virus [HIV] infection status: Secondary | ICD-10-CM | POA: Insufficient documentation

## 2023-03-19 DIAGNOSIS — A510 Primary genital syphilis: Secondary | ICD-10-CM | POA: Insufficient documentation

## 2023-03-19 DIAGNOSIS — J45909 Unspecified asthma, uncomplicated: Secondary | ICD-10-CM | POA: Insufficient documentation

## 2023-03-19 MED ORDER — PENICILLIN G BENZATHINE 1200000 UNIT/2ML IM SUSY
2.4000 10*6.[IU] | PREFILLED_SYRINGE | Freq: Once | INTRAMUSCULAR | Status: AC
  Administered 2023-03-19: 2.4 10*6.[IU] via INTRAMUSCULAR
  Filled 2023-03-19: qty 4

## 2023-03-19 NOTE — ED Provider Notes (Signed)
[place] EMERGENCY DEPARTMENT AT [facility] Provider Note   CSN: [id number] Arrival date & time: 03/19/23  1329     History  Chief Complaint  Patient presents with   Exposure to STD    Mike Harrison is a 62 y.o. male.  With history of HIV, anxiety, depression, asthma presenting to the ED requesting treatment for syphilis.  He was sexually active approximately 1 week ago with a male when the condom broke.  He developed a painless ulcer to the left side of his penis 4 days ago.  Presented to urgent care 2 days ago and was tested for STIs.  Was called today and told that he was positive for syphilis.  Presented again to the same urgent care but was told that they did not have treatment there.  He reports dysuria and dark-colored urine.  He denies testicle pain.  He reports swollen lymph nodes in the left groin.  He denies any fevers or chills, nausea or vomiting, testicle pain.  Reports he was also tested for chlamydia, gonorrhea and was negative.  Does have a history of HIV.  He is currently undetectable.  Reports compliance with his medications.  He plans to follow-up with his partner for treatment.   Exposure to STD       Home Medications Prior to Admission medications   Medication Sig Start Date End Date Taking? Authorizing Provider  atorvastatin (LIPITOR) 20 MG tablet Take 1 tablet (20 mg total) by mouth daily. 06/07/22   Randall Hiss, MD  atorvastatin (LIPITOR) 20 MG tablet Take one tablet by mouth every day 05/27/22   Daiva Eves, Lisette Grinder, MD  bictegravir-emtricitabine-tenofovir AF (BIKTARVY) 50-200-25 MG TABS tablet Take 1 tablet by mouth daily. 11/10/21   Randall Hiss, MD  bictegravir-emtricitabine-tenofovir AF [facility]) [phone] MG TABS tablet Take one tablet by moth every day 11/22/22   Daiva Eves, Lisette Grinder, MD  mirtazapine (REMERON) 15 MG tablet Take 2 tablets (30 mg total) by mouth at bedtime. 05/09/22   Park Pope, MD  Multiple Vitamins-Iron  (MULTIVITAMIN/IRON PO) Take 1 tablet by mouth daily.    [provider]  Vitamin D, Cholecalciferol, 25 MCG (1000 UT) TABS Take 2,000 Units by mouth every other day.    [provider]      Allergies    Hydrochlorothiazide, Lisinopril, and Bactrim [sulfamethoxazole-trimethoprim]    Review of Systems   Review of Systems  Genitourinary:  Positive for dysuria and genital sores.  All other systems reviewed and are negative.   Physical Exam Updated Vital Signs BP (!) 169/102   Pulse 74   Temp 97.8 F (36.6 C) (Oral)   Resp 18   Ht 5\' 11"  (1.803 m)   Wt 92.1 kg   SpO2 99%   BMI 28.31 kg/m  Physical Exam Vitals and nursing note reviewed. Exam conducted with a chaperone present Designer, jewell[employer], [employer] fundraiser).  Constitutional:      General: He is not in acute distress.    Appearance: Normal appearance. He is normal weight. He is not ill-appearing.  HENT:     Head: Normocephalic and atraumatic.  Pulmonary:     Effort: Pulmonary effort is normal. No respiratory distress.  Abdominal:     General: Abdomen is flat.  Genitourinary:    Comments: Shallow ulcer to the left side of the penis.  Swollen and tender lymphadenopathy in the left groin.  No testicle pain.  No acute rashes or lesions. Musculoskeletal:  General: Normal range of motion.     Cervical back: Neck supple.  Skin:    General: Skin is warm and dry.     Comments: No rashes to palms or soles  Neurological:     Mental Status: He is alert and oriented to person, place, and time.  Psychiatric:        Mood and Affect: Mood normal.        Behavior: Behavior normal.     ED Results / Procedures / Treatments   Labs (all labs ordered are listed, but only abnormal results are displayed) Labs Reviewed - No data to display  EKG None  Radiology No results found.  Procedures Procedures    Medications Ordered in ED Medications  penicillin g benzathine (BICILLIN LA) 1200000 UNIT/2ML injection 2.4 Million Units  (has no administration in time range)    ED Course/ Medical Decision Making/ A&P                                 Medical Decision Making This patient presents to the ED for concern of syphilis, this involves an extensive number of treatment options, and is a complaint that carries with it a high risk of complications and morbidity.  The differential diagnosis includes, chlamydia, gonorrhea  My initial workup includes  Additional history obtained from: Nursing notes from this visit.  Afebrile, hypertensive but otherwise hemodynamically stable.  62 year old male presenting requesting treatment for syphilis.  [facility], was called today and told he was positive.  States the urgent care does not have treatment for syphilis onsite and was referred to the emergency department.  Reports pain is also to the left side of the penis with tender lymphadenopathy in the left groin.  No testicle pain.  Reports some dysuria as well.  Was reportedly tested for chlamydia and gonorrhea and was negative.  Has HIV which is currently undetectable.  Reports compliance with his medications.  Patient was treated with 2,400,000 units of intramuscular penicillin.  He was encouraged to follow-up with his infectious disease doctor.  He was given return precautions.  Stable at discharge.  At this time there does not appear to be any evidence of an acute emergency medical condition and the patient appears stable for discharge with appropriate outpatient follow up. Diagnosis was discussed with patient who verbalizes understanding of care plan and is agreeable to discharge. I have discussed return precautions with patient who verbalizes understanding. Patient encouraged to follow-up with their PCP within 1 week. All questions answered.  Note: Portions of this report may have been transcribed using voice recognition software. Every effort was made to ensure accuracy; however, inadvertent  computerized transcription errors may still be present.        Final Clinical Impression(s) / ED Diagnoses Final diagnoses:  Primary syphilis    Rx / DC Orders ED Discharge Orders     None         Michelle Piper, Cordelia Poche 03/19/23 1437    Pricilla Loveless, MD 03/20/23 [phone]

## 2023-03-19 NOTE — ED Triage Notes (Signed)
Pt diagnosed with syphillis today, UC did not have the tx and sent him here for tx.

## 2023-03-19 NOTE — Discharge Instructions (Addendum)
You have been seen today for your complaint of syphilis.  You were treated for this with an injection of penicillin.  I recommend you obtain a test of cure in 2 weeks. Follow up with: Your infectious disease doctor for reevaluation Please seek immediate medical care if you develop any of the following symptoms: You have severe chest pain. You have trouble walking or coordinating movements. You are confused. You lose vision or hearing. You have numbness in your arms or legs. You have a seizure. You faint. You have a severe headache that does not go away with medicine. At this time there does not appear to be the presence of an emergent medical condition, however there is always the potential for conditions to change. Please read and follow the below instructions.  Do not take your medicine if  develop an itchy rash, swelling in your mouth or lips, or difficulty breathing; call 911 and seek immediate emergency medical attention if this occurs.  You may review your lab tests and imaging results in their entirety on your MyChart account.  Please discuss all results of fully with your primary care provider and other specialist at your follow-up visit.  Note: Portions of this text may have been transcribed using voice recognition software. Every effort was made to ensure accuracy; however, inadvertent computerized transcription errors may still be present.

## 2023-03-27 ENCOUNTER — Other Ambulatory Visit: Payer: Self-pay

## 2023-03-31 ENCOUNTER — Other Ambulatory Visit: Payer: Self-pay

## 2023-03-31 ENCOUNTER — Other Ambulatory Visit (HOSPITAL_COMMUNITY): Payer: Self-pay

## 2023-04-03 ENCOUNTER — Other Ambulatory Visit: Payer: Self-pay

## 2023-04-05 ENCOUNTER — Other Ambulatory Visit: Payer: Self-pay

## 2023-04-05 ENCOUNTER — Ambulatory Visit: Payer: PRIVATE HEALTH INSURANCE | Admitting: Family Medicine

## 2023-04-06 ENCOUNTER — Other Ambulatory Visit (HOSPITAL_COMMUNITY): Payer: Self-pay

## 2023-04-09 ENCOUNTER — Encounter: Payer: Self-pay | Admitting: Infectious Disease

## 2023-04-09 DIAGNOSIS — R197 Diarrhea, unspecified: Secondary | ICD-10-CM | POA: Insufficient documentation

## 2023-04-09 NOTE — Progress Notes (Deleted)
   Subjective:   Chief Complaint; follow-up for HIV disease   Patient ID: Mike Harrison, male    DOB: Apr 28, 1960, 63 y.o.   MRN: [id number]  HPI  Past Medical History:  Diagnosis Date   Acute medial meniscus tear    Allergic reaction to alpha-gal 02/12/2021   Allergy to alpha-gal 11/10/2021   Anxiety    Arthritis    Arthritis    Asthma    Depression    Foot pain 11/22/2022   Hypertension 12/31/2020   Onychomycosis    Pruritus    Smoker     Past Surgical History:  Procedure Laterality Date   HERNIA REPAIR      Family History  Problem Relation Age of Onset   Cancer Mother    Hypertension Mother    Cirrhosis Father       Social History   Socioeconomic History   Marital status: Widowed    Spouse name: Not on file   Number of children: Not on file   Years of education: Not on file   Highest education level: Not on file  Occupational History   Not on file  Tobacco Use   Smoking status: Some Days    Current packs/day: 0.50    Types: Cigarettes   Smokeless tobacco: Never   Tobacco comments:    Changed to Black and Mild and slowed down smoking. 11/10/21  Vaping Use   Vaping status: Never Used  Substance and Sexual Activity   Alcohol use: Never   Drug use: Never   Sexual activity: Not on file  Other Topics Concern   Not on file  Social History Narrative   Not on file   Social Drivers of Health   Financial Resource Strain: Not on file  Food Insecurity: Not on file  Transportation Needs: Not on file  Physical Activity: Not on file  Stress: Not on file  Social Connections: Not on file    Allergies  Allergen Reactions   Hydrochlorothiazide Other (See Comments)    Scrotal edema   Lisinopril Other (See Comments)    Dry cough   Bactrim [Sulfamethoxazole-Trimethoprim] Itching, Nausea And Vomiting and Other (See Comments)    Loose bowels, chills     Current Outpatient Medications:    atorvastatin  (LIPITOR) 20 MG tablet, Take 1 tablet (20 mg total) by  mouth daily., Disp: 30 tablet, Rfl: 11   atorvastatin  (LIPITOR) 20 MG tablet, Take one tablet by mouth every day, Disp: 30 tablet, Rfl: 11   bictegravir-emtricitabine -tenofovir  AF (BIKTARVY ) 50-200-25 MG TABS tablet, Take 1 tablet by mouth daily., Disp: 30 tablet, Rfl: 11   bictegravir-emtricitabine -tenofovir  AF (BIKTARVY ) 50-200-25 MG TABS tablet, Take one tablet by moth every day, Disp: 30 tablet, Rfl: 11   mirtazapine  (REMERON ) 15 MG tablet, Take 2 tablets (30 mg total) by mouth at bedtime., Disp: , Rfl:    Multiple Vitamins-Iron (MULTIVITAMIN/IRON PO), Take 1 tablet by mouth daily., Disp: , Rfl:    Vitamin D , Cholecalciferol , 25 MCG (1000 UT) TABS, Take 2,000 Units by mouth every other day., Disp: , Rfl:    Review of Systems     Objective:   Physical Exam        Assessment & Plan:

## 2023-04-10 ENCOUNTER — Ambulatory Visit: Payer: Self-pay | Admitting: Infectious Disease

## 2023-04-10 ENCOUNTER — Ambulatory Visit: Payer: Medicare HMO | Admitting: Infectious Disease

## 2023-04-10 ENCOUNTER — Telehealth: Payer: Self-pay

## 2023-04-10 DIAGNOSIS — F17200 Nicotine dependence, unspecified, uncomplicated: Secondary | ICD-10-CM

## 2023-04-10 DIAGNOSIS — Z91018 Allergy to other foods: Secondary | ICD-10-CM

## 2023-04-10 DIAGNOSIS — E785 Hyperlipidemia, unspecified: Secondary | ICD-10-CM

## 2023-04-10 DIAGNOSIS — B20 Human immunodeficiency virus [HIV] disease: Secondary | ICD-10-CM

## 2023-04-10 DIAGNOSIS — R197 Diarrhea, unspecified: Secondary | ICD-10-CM

## 2023-04-10 DIAGNOSIS — J4540 Moderate persistent asthma, uncomplicated: Secondary | ICD-10-CM

## 2023-04-10 DIAGNOSIS — I10 Essential (primary) hypertension: Secondary | ICD-10-CM

## 2023-04-10 NOTE — Telephone Encounter (Signed)
 Attempted to call patient regarding missed appt. Left voicemail requesting reschedule. Juanita Laster, RMA

## 2023-05-08 ENCOUNTER — Other Ambulatory Visit: Payer: Self-pay

## 2023-05-15 ENCOUNTER — Other Ambulatory Visit (HOSPITAL_COMMUNITY): Payer: Self-pay

## 2023-05-16 ENCOUNTER — Telehealth: Payer: Self-pay | Admitting: Infectious Disease

## 2023-05-16 ENCOUNTER — Other Ambulatory Visit (HOSPITAL_COMMUNITY): Payer: Self-pay

## 2023-05-16 ENCOUNTER — Telehealth: Payer: Self-pay

## 2023-05-16 NOTE — Telephone Encounter (Signed)
[facility] Patient Advocate Encounter   I was successful in securing patient a $ 2000 grant from [employer] to provide copayment coverage for BIKTARVY.  The patient's out of pocket cost will be $0 monthly.     I have spoken with the patient.    The billing information is as follows and has been shared with The Pharmacy, The Patient .   Member ID: [id number] Group ID: [id number] RxBin: [id number] Dates of Eligibility: 05/15/23 through 03/27/24     Patient knows to call the office with questions or concerns.  Kae Heller, CPhT Specialty Pharmacy Patient [facility] Phone: [phone] Fax:  [phone]

## 2023-05-16 NOTE — Telephone Encounter (Signed)
Left vm rwith [facility] stating we did not order Colonoscopy and should reach out to PCP. Pt is also due for appt. Will need to schedule follow up.  Will fax last note as requested. Juanita Laster, RMA

## 2023-05-16 NOTE — Telephone Encounter (Signed)
Mike Harrison of [facility] called to request Colonoscopy orders and notes faxed over to [phone]

## 2023-05-30 ENCOUNTER — Other Ambulatory Visit: Payer: Self-pay

## 2023-05-30 ENCOUNTER — Encounter: Payer: Self-pay | Admitting: Infectious Disease

## 2023-05-30 ENCOUNTER — Telehealth: Payer: Self-pay

## 2023-05-30 ENCOUNTER — Ambulatory Visit
Admission: RE | Admit: 2023-05-30 | Discharge: 2023-05-30 | Disposition: A | Discharge status: Home / Self Care | Source: Ambulatory Visit | Attending: Infectious Disease | Admitting: Infectious Disease

## 2023-05-30 ENCOUNTER — Other Ambulatory Visit (HOSPITAL_COMMUNITY): Payer: Self-pay

## 2023-05-30 ENCOUNTER — Other Ambulatory Visit (HOSPITAL_COMMUNITY)
Admission: RE | Admit: 2023-05-30 | Discharge: 2023-05-30 | Disposition: A | Discharge status: Home / Self Care | Source: Ambulatory Visit | Attending: Infectious Disease | Admitting: Infectious Disease

## 2023-05-30 ENCOUNTER — Ambulatory Visit (INDEPENDENT_AMBULATORY_CARE_PROVIDER_SITE_OTHER): Payer: Medicaid Other | Admitting: Infectious Disease

## 2023-05-30 VITALS — BP 144/83 | HR 58 | Temp 97.6°F | Wt 198.0 lb

## 2023-05-30 DIAGNOSIS — M25562 Pain in left knee: Secondary | ICD-10-CM

## 2023-05-30 DIAGNOSIS — F419 Anxiety disorder, unspecified: Secondary | ICD-10-CM | POA: Diagnosis not present

## 2023-05-30 DIAGNOSIS — F331 Major depressive disorder, recurrent, moderate: Secondary | ICD-10-CM

## 2023-05-30 DIAGNOSIS — E785 Hyperlipidemia, unspecified: Secondary | ICD-10-CM | POA: Diagnosis present

## 2023-05-30 DIAGNOSIS — R002 Palpitations: Secondary | ICD-10-CM

## 2023-05-30 DIAGNOSIS — G8929 Other chronic pain: Secondary | ICD-10-CM

## 2023-05-30 DIAGNOSIS — F32A Depression, unspecified: Secondary | ICD-10-CM | POA: Diagnosis not present

## 2023-05-30 DIAGNOSIS — R519 Headache, unspecified: Secondary | ICD-10-CM | POA: Insufficient documentation

## 2023-05-30 DIAGNOSIS — B20 Human immunodeficiency virus [HIV] disease: Secondary | ICD-10-CM | POA: Diagnosis present

## 2023-05-30 DIAGNOSIS — G4489 Other headache syndrome: Secondary | ICD-10-CM

## 2023-05-30 DIAGNOSIS — I10 Essential (primary) hypertension: Secondary | ICD-10-CM

## 2023-05-30 DIAGNOSIS — R42 Dizziness and giddiness: Secondary | ICD-10-CM

## 2023-05-30 DIAGNOSIS — R198 Other specified symptoms and signs involving the digestive system and abdomen: Secondary | ICD-10-CM

## 2023-05-30 DIAGNOSIS — Z91014 Allergy to mammalian meats: Secondary | ICD-10-CM | POA: Diagnosis not present

## 2023-05-30 DIAGNOSIS — J4540 Moderate persistent asthma, uncomplicated: Secondary | ICD-10-CM

## 2023-05-30 DIAGNOSIS — Z91018 Allergy to other foods: Secondary | ICD-10-CM

## 2023-05-30 DIAGNOSIS — Z113 Encounter for screening for infections with a predominantly sexual mode of transmission: Secondary | ICD-10-CM

## 2023-05-30 MED ORDER — ATORVASTATIN CALCIUM 20 MG PO TABS: 20.0000 mg | ORAL_TABLET | Freq: Every day | ORAL | 11 refills | Status: DC

## 2023-05-30 MED ORDER — BICTEGRAVIR-EMTRICITAB-TENOFOV 50-200-25 MG PO TABS: ORAL_TABLET | ORAL | 11 refills | Status: DC

## 2023-05-30 MED ORDER — ATORVASTATIN CALCIUM 20 MG PO TABS
ORAL_TABLET | ORAL | 11 refills | Status: DC
  Filled 2023-05-30: qty 30, 30d supply, fill #0

## 2023-05-30 MED ORDER — MIRTAZAPINE 15 MG PO TABS
30.0000 mg | ORAL_TABLET | Freq: Every day | ORAL | 11 refills | Status: DC
  Filled 2023-05-30: qty 30, 15d supply, fill #0

## 2023-05-30 MED ORDER — ROSUVASTATIN CALCIUM 20 MG PO TABS: 20.0000 mg | ORAL_TABLET | Freq: Every day | ORAL | 11 refills | Status: DC

## 2023-05-30 MED ORDER — BICTEGRAVIR-EMTRICITAB-TENOFOV 50-200-25 MG PO TABS
ORAL_TABLET | ORAL | 11 refills | Status: DC
  Filled 2023-05-30: qty 30, 30d supply, fill #0

## 2023-05-30 MED ORDER — MIRTAZAPINE 15 MG PO TABS: 30.0000 mg | ORAL_TABLET | Freq: Every day | ORAL | 11 refills | Status: DC

## 2023-05-30 MED ORDER — ATORVASTATIN CALCIUM 20 MG PO TABS
20.0000 mg | ORAL_TABLET | Freq: Every day | ORAL | 11 refills | Status: DC
  Filled 2023-05-30: qty 30, 30d supply, fill #0

## 2023-05-30 NOTE — Progress Notes (Signed)
 Subjective:  Chief complaint: follow-up for HIV disease on medications    Patient ID: Mike Harrison, male    DOB: 01/20/1961, 63 y.o.   MRN: [id number]  HPI  Discussed the use of AI scribe software for clinical note transcription with the patient, who gave verbal consent to proceed.  History of Present Illness   The patient, with a history of HIV/AIDS, syphilis, and alpha-gal allergy, presents with multiple symptoms. The patient reports experiencing depression and anxiety, which have been affecting his daily life and work. He describes periods of extreme anxiety and panic, as well as periods of low energy where he doesn't feel like getting out of bed for days. He also reports difficulty sleeping, either sleeping too little or too much.  The patient also reports experiencing dizziness, which occurs out of the blue and sometimes leads to confusion about his surroundings. He has not passed out from these episodes. He also reports palpitations, which he describes as a fluttering sensation that can occur even when he is at rest.  The patient has been experiencing headaches, which he reports going to bed with and waking up with. He has made dietary changes due to his alpha-gal allergy, cutting out pork and other mammal products--though he does still ingest milk  The patient also reports knee pain, which has been affecting his mobility. He describes the pain as sharp and located in the left knee. He has a history of a knee infection due to gonorrhea and a meniscus tear. The knee pain has been worsening, and he reports being unable to walk more than a short distance without pain. he had recent unprotected sex and states he was diagnosed with syphilis but he is worried that pain is persisting.       Past Medical History:  Diagnosis Date   Acute medial meniscus tear    Allergic reaction to alpha-gal 02/12/2021   Allergy to alpha-gal 11/10/2021   Anxiety    Arthritis    Arthritis    Asthma     Depression    Diarrhea 04/09/2023   Foot pain 11/22/2022   Hypertension 12/31/2020   Onychomycosis    Pruritus    Smoker     Past Surgical History:  Procedure Laterality Date   HERNIA REPAIR      Family History  Problem Relation Age of Onset   Cancer Mother    Hypertension Mother    Cirrhosis Father       Social History   Socioeconomic History   Marital status: Widowed    Spouse name: Not on file   Number of children: Not on file   Years of education: Not on file   Highest education level: Not on file  Occupational History   Not on file  Tobacco Use   Smoking status: Some Days    Current packs/day: 0.50    Types: Cigarettes   Smokeless tobacco: Never   Tobacco comments:    Changed to Black and Mild and slowed down smoking. 11/10/21  Vaping Use   Vaping status: Never Used  Substance and Sexual Activity   Alcohol use: Never   Drug use: Never   Sexual activity: Not on file  Other Topics Concern   Not on file  Social History Narrative   Not on file   Social Drivers of Health   Financial Resource Strain: Not on file  Food Insecurity: Not on file  Transportation Needs: Not on file  Physical Activity: Not on file  Stress: Not  on file  Social Connections: Not on file    Allergies  Allergen Reactions   Hydrochlorothiazide Other (See Comments)    Scrotal edema   Lisinopril Other (See Comments)    Dry cough   Bactrim [Sulfamethoxazole-Trimethoprim] Itching, Nausea And Vomiting and Other (See Comments)    Loose bowels, chills     Current Outpatient Medications:    atorvastatin (LIPITOR) 20 MG tablet, Take 1 tablet (20 mg total) by mouth daily., Disp: 30 tablet, Rfl: 11   atorvastatin (LIPITOR) 20 MG tablet, Take one tablet by mouth every day, Disp: 30 tablet, Rfl: 11   bictegravir-emtricitabine-tenofovir AF (BIKTARVY) 50-200-25 MG TABS tablet, Take 1 tablet by mouth daily., Disp: 30 tablet, Rfl: 11   bictegravir-emtricitabine-tenofovir AF (BIKTARVY)  50-200-25 MG TABS tablet, Take one tablet by moth every day, Disp: 30 tablet, Rfl: 11   mirtazapine (REMERON) 15 MG tablet, Take 2 tablets (30 mg total) by mouth at bedtime., Disp: , Rfl:    Multiple Vitamins-Iron (MULTIVITAMIN/IRON PO), Take 1 tablet by mouth daily., Disp: , Rfl:    Vitamin D, Cholecalciferol, 25 MCG (1000 UT) TABS, Take 2,000 Units by mouth every other day., Disp: , Rfl:    Review of Systems  Constitutional:  Positive for activity change and fatigue. Negative for appetite change, chills, diaphoresis, fever and unexpected weight change.  HENT:  Negative for congestion, rhinorrhea, sinus pressure, sneezing, sore throat and trouble swallowing.   Eyes:  Negative for photophobia and visual disturbance.  Respiratory:  Negative for cough, chest tightness, shortness of breath, wheezing and stridor.   Cardiovascular:  Positive for palpitations. Negative for chest pain and leg swelling.  Gastrointestinal:  Negative for abdominal distention, abdominal pain, anal bleeding, blood in stool, constipation, diarrhea, nausea and vomiting.  Genitourinary:  Negative for difficulty urinating, dysuria, flank pain and hematuria.  Musculoskeletal:  Positive for arthralgias and joint swelling. Negative for back pain, gait problem and myalgias.  Skin:  Negative for color change, pallor, rash and wound.  Neurological:  Positive for dizziness. Negative for tremors, weakness and light-headedness.  Hematological:  Negative for adenopathy. Does not bruise/bleed easily.  Psychiatric/Behavioral:  Negative for agitation, behavioral problems, confusion, decreased concentration, dysphoric mood and sleep disturbance.        Objective:   Physical Exam Constitutional:      Appearance: He is well-developed.  HENT:     Head: Normocephalic and atraumatic.  Eyes:     Conjunctiva/sclera: Conjunctivae normal.  Cardiovascular:     Rate and Rhythm: Normal rate and regular rhythm.  Pulmonary:     Effort:  Pulmonary effort is normal. No respiratory distress.     Breath sounds: No wheezing.  Abdominal:     General: There is no distension.     Palpations: Abdomen is soft.  Musculoskeletal:     Cervical back: Normal range of motion and neck supple.     Left knee: Decreased range of motion. Tenderness present.  Skin:    General: Skin is warm and dry.     Coloration: Skin is not pale.     Findings: No erythema or rash.  Neurological:     General: No focal deficit present.     Mental Status: He is alert and oriented to person, place, and time.  Psychiatric:        Mood and Affect: Mood normal.        Behavior: Behavior normal.        Thought Content: Thought content normal.  Judgment: Judgment normal.           Assessment & Plan:   Assessment and Plan    HIV/AIDS AIDS with improved CD4 count at 339. Continued Biktarvy due to its efficacy. Injectable antiretrovirals not pursued due to adherence concerns. - Continue Biktarvy as prescribed --check HIV RNA, CD4 routine labs  Knee Pain and Possible STD-related Arthritis. I am skeptical that his current knee pain is due to DGI. He has no effusion but he does have point tenderness making me concerned for anatomical issue Persistent knee pain possibly linked to past STD or sports injuries. Concern for disseminated gonococcal infection or STD-related arthritis. - Order x-ray of the left knee. - Test for gonorrhea and chlamydia. - Recheck syphilis titers. - Refer to sports medicine for further evaluation.  Depression and Anxiety Significant depression and anxiety impacting daily function. Previous Remeron use discussed. Emphasized mental health focus and counseling. - Prescribe Remeron (mirtazapine). - Refer to in-house counselor for weekly sessions. - Consider referral to a psychiatrist for medication management.  Dizziness and Palpitations Episodes of dizziness and palpitations with unclear etiology. Cardiac causes  considered. - Refer to cardiology for evaluation of dizziness and palpitations.  Alpha-gal Syndrome Positive for alpha-gal syndrome with dietary modifications implemented. Discussed symptom variability and dietary management. --will recheck his alpha gal test  Follow-up Requires monitoring of conditions and treatment response. - Schedule follow-up appointment in 1-2 months.      I have personally spent 42 minutes involved in face-to-face and non-face-to-face activities for this patient on the day of the visit. Professional time spent includes the following activities: Preparing to see the patient (review of tests), Obtaining and/or reviewing separately obtained history (admission/discharge record), Performing a medically appropriate examination and/or evaluation , Ordering medications/tests/procedures, referring and communicating with other health care professionals, Documenting clinical information in the EMR, Independently interpreting results (not separately reported), Communicating results to the patient/family/caregiver, Counseling and educating the patient/family/caregiver and Care coordination (not separately reported).

## 2023-05-30 NOTE — Progress Notes (Signed)
 Patient is transferring meds to [facility] stating that location is closer and easier to pick up from

## 2023-05-30 NOTE — Progress Notes (Signed)
 Specialty Pharmacy Refill Coordination Note  Mike Harrison is a 63 y.o. male contacted today regarding refills of specialty medication(s) Bictegravir-Emtricitab-Tenofov Susanne Borders)   Patient requested Delivery   Delivery date: 06/01/23   Verified address: [address] [place] [address]   Medication will be filled on 05/31/23.

## 2023-05-30 NOTE — Telephone Encounter (Signed)
 Patient called requesting Biktarvy, atorvastatin, and mirtazapine [facility] to [facility] [address] due to proximity.   He is also asking for refills of diclofenac and albuterol. Will route to provider.   Sandie Ano, RN

## 2023-05-31 ENCOUNTER — Telehealth: Payer: Self-pay | Admitting: Infectious Disease

## 2023-05-31 ENCOUNTER — Other Ambulatory Visit: Payer: Self-pay | Admitting: Infectious Disease

## 2023-05-31 DIAGNOSIS — B20 Human immunodeficiency virus [HIV] disease: Secondary | ICD-10-CM

## 2023-05-31 DIAGNOSIS — E785 Hyperlipidemia, unspecified: Secondary | ICD-10-CM

## 2023-05-31 DIAGNOSIS — F331 Major depressive disorder, recurrent, moderate: Secondary | ICD-10-CM

## 2023-05-31 LAB — URINE CYTOLOGY ANCILLARY ONLY
Chlamydia: NEGATIVE
Comment: NEGATIVE
Comment: NORMAL
Neisseria Gonorrhea: NEGATIVE

## 2023-05-31 LAB — CYTOLOGY, (ORAL, ANAL, URETHRAL) ANCILLARY ONLY
Chlamydia: NEGATIVE
Comment: NEGATIVE
Comment: NORMAL
Neisseria Gonorrhea: NEGATIVE

## 2023-05-31 MED ORDER — ATORVASTATIN CALCIUM 20 MG PO TABS
20.0000 mg | ORAL_TABLET | Freq: Every day | ORAL | 11 refills | Status: AC
Start: 2023-05-31 — End: ?

## 2023-05-31 MED ORDER — MIRTAZAPINE 15 MG PO TABS: 30.0000 mg | ORAL_TABLET | Freq: Every day | ORAL | 11 refills | Status: AC

## 2023-05-31 MED ORDER — BICTEGRAVIR-EMTRICITAB-TENOFOV 50-200-25 MG PO TABS
ORAL_TABLET | ORAL | 11 refills | Status: AC
Start: 2023-05-31 — End: ?

## 2023-05-31 NOTE — Telephone Encounter (Signed)
 Xra shows osteoarthrosis, no fracture or effusion

## 2023-05-31 NOTE — Telephone Encounter (Signed)
 Attempted to call patient to review results. Not able to reach him at this time.  Has appt with Sports Medicine on 3/6. Would recommend keeping this appt. Juanita Laster, RMA

## 2023-06-01 ENCOUNTER — Ambulatory Visit (INDEPENDENT_AMBULATORY_CARE_PROVIDER_SITE_OTHER): Admitting: Family Medicine

## 2023-06-01 ENCOUNTER — Encounter: Payer: Self-pay | Admitting: Family Medicine

## 2023-06-01 ENCOUNTER — Other Ambulatory Visit (HOSPITAL_COMMUNITY): Payer: Self-pay

## 2023-06-01 VITALS — BP 138/88 | Ht 71.0 in | Wt 200.0 lb

## 2023-06-01 DIAGNOSIS — M25562 Pain in left knee: Secondary | ICD-10-CM | POA: Diagnosis present

## 2023-06-01 DIAGNOSIS — M1712 Unilateral primary osteoarthritis, left knee: Secondary | ICD-10-CM | POA: Diagnosis not present

## 2023-06-01 DIAGNOSIS — B20 Human immunodeficiency virus [HIV] disease: Secondary | ICD-10-CM | POA: Diagnosis not present

## 2023-06-01 DIAGNOSIS — M02362 Reiter's disease, left knee: Secondary | ICD-10-CM

## 2023-06-01 LAB — T-HELPER CELLS (CD4) COUNT (NOT AT ARMC)
CD4 % Helper T Cell: 19 % — ABNORMAL LOW (ref 33–65)
CD4 T Cell Abs: 204 /uL — ABNORMAL LOW (ref 400–1790)

## 2023-06-01 MED ORDER — TRAMADOL HCL 50 MG PO TABS: 50.0000 mg | ORAL_TABLET | Freq: Every evening | ORAL | 0 refills | Status: AC | PRN

## 2023-06-01 NOTE — Assessment & Plan Note (Signed)
 History of HIV, currently on treatment with Biktarvy -Now with acute on chronic left knee pain concerning for possible flare of underlying osteoarthritis versus reactive arthritis versus osteomyelitis  Plan: Plan: -Reviewed office visit notes with PCP from 05/30/2023 with findings summarized in the HPI -Reviewed labs ordered by PCP 05/30/2023 showing normal STI testing, normal CBC -Reviewed left knee x-rays 05/30/2023 showing moderate knee osteoarthritis -Given ongoing symptoms and significant pain ordered MRI left knee to rule out reactive arthritis, meniscus tear, osteomyelitis -Labs: Stat ESR, CRP, Lyme to further evaluate for possible reactive arthritis -Rx tramadol 50 mg 1-2 tabs p.o. nightly as needed severe pain, [place] PMP reviewed, no concerns.  Dispense 10 tabs, no refills -Will continue oral NSAIDs as needed.  He already has Naprosyn at home. -Follow-up pending labs and MRI -Red flag symptoms reviewed, patient understands when he should call or seek medical evaluation

## 2023-06-01 NOTE — Patient Instructions (Addendum)
 Call [facility] and see how quickly they can schedule the MRI.  If they don't have anything soon, you can call one of the places below to see what availability they have. Get labs when you leave here today.  [facility] at [facility] [address], [place], [place] [address] Phone: [phone]  [facility] at [facility] [address], [place], [place] [address] Phone: [phone]

## 2023-06-01 NOTE — Progress Notes (Signed)
 DATE OF VISIT: 06/01/2023        Mike Harrison DOB: [birth year] MRN: [id number]  CC:  LT knee pain  History of present Illness: Mike Harrison is a 63 y.o. 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  Seen by PCP 05/30/23 - Dr Daiva Eves - c/o increasing Lt knee pain affecting mobility - hx of prior knee infection due to gonorrhea -- was diagnosed by knee aspiration; was when younger - hx of meniscus tear as well - was very active as an athlete - now having increased pain with walking - recent unprotected sex & dx with chlamydia  - PCP concerned for possible reactive arthritis due to STI vs OA -- xray ordered --  labs ordered and completed 05/30/23 -- neg GC/CT, neg RPR - patient recommended to keep appt with sports medicine  Today patient notes ongoing severe pain in the left knee He feels that the area is swollen Denies any increased redness Denies any fevers/chills/night sweats Reports recent STI about 1 month ago - dark urine, no other symptoms -States was diagnosed with chlamydia and treated with an antibiotic injection He notes increasing left knee pain since that time, denies any specific injury or trauma He does have history of underlying osteoarthritis, as well as meniscus tear 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 when he was younger that required antibiotic therapy Current pain is affecting his sleep Notes clicking and popping in the knee No improvement with Voltaren PO, Voltaren gel, Naprosyn Remote hx of tick bite - notes prior neg Lyme test ~4 years ago -No recent Lyme testing  Hx cocaine abuse - last was over 20+ years ago  Previously seen by [place] 12/30/21 - saw Dr Margaretha Sheffield - underwent b/l knee CSI at that time  Medications:  Outpatient Encounter Medications as of 06/01/2023  Medication Sig   traMADol (ULTRAM) 50 MG tablet Take 1-2 tablets (50-100 mg total) by mouth at bedtime  as needed for up to 5 days for moderate pain (pain score 4-6) (knee pain).   ALBUTEROL IN Inhale into the lungs.   atorvastatin (LIPITOR) 20 MG tablet Take 1 tablet (20 mg total) by mouth daily.   bictegravir-emtricitabine-tenofovir AF (BIKTARVY) 50-200-25 MG TABS tablet Take one tablet by moth every day   mirtazapine (REMERON) 15 MG tablet Take 2 tablets (30 mg total) by mouth at bedtime.   terbinafine (LAMISIL) 250 MG tablet Take 250 mg by mouth daily.   Vitamin D, Cholecalciferol, 25 MCG (1000 UT) TABS Take 2,000 Units by mouth every other day.   No facility-administered encounter medications on file as of 06/01/2023.    Allergies: is allergic to hydrochlorothiazide, lisinopril, and bactrim [sulfamethoxazole-trimethoprim].  Physical Examination: Vitals: BP 138/88   Ht 5\' 11"  (1.803 m)   Wt 200 lb (90.7 kg)   BMI 27.89 kg/m  GENERAL:  Mike Harrison is a 63 y.o. male appearing their stated age, alert and oriented x 3, in no apparent distress.  SKIN: no rashes or lesions, skin clean, dry, intact MSK: Knee: Left knee without swelling or effusion.  No increased redness or warmth.  Diffuse tenderness with light palpation along the anterior, medial, lateral knee.  Decreased range of motion active and passively 0 to 90 degrees with pain.  Pain with Mc[place], but no palpable click.  No ligamentous laxity. Right knee with full range of motion without pain, weakness, instability. Walking with an antalgic gait NEURO: sensation intact  to light touch VASC: pulses 2+ and symmetric DP/PT bilaterally, no edema  Radiology: XRAY:  LT knee XR 05/30/23 (standing AP/Lateral/Sunrise) personally reviewed and interpreted by me today showing: - mild to moderate OA with medial joint space narrowing and patellofemoral OA  LABS: Recent Results (from the past 2160 hours)  T-helper cells (CD4) count (not at [facility])     Status: Abnormal   Collection Time: 05/30/23  9:00 AM  Result Value Ref Range   CD4 T Cell Abs  204 (L) 400 - 1,790 /uL   CD4 % Helper T Cell 19 (L) 33 - 65 %    Comment: Performed at [facility], [address], [place], [place] [address]  Urine cytology ancillary only     Status: None   Collection Time: 05/30/23  9:00 AM  Result Value Ref Range   Neisseria Gonorrhea Negative    Chlamydia Negative    Comment Normal Reference Ranger Chlamydia - Negative    Comment      Normal Reference Range Neisseria Gonorrhea - Negative  Cytology (oral, anal, urethral) ancillary only     Status: None   Collection Time: 05/30/23  9:31 AM  Result Value Ref Range   Neisseria Gonorrhea Negative    Chlamydia Negative    Comment Normal Reference Ranger Chlamydia - Negative    Comment      Normal Reference Range Neisseria Gonorrhea - Negative  COMPLETE METABOLIC PANEL WITH GFR     Status: None   Collection Time: 05/30/23  9:39 AM  Result Value Ref Range   Glucose, Bld 92 65 - 99 mg/dL    Comment: .            Fasting reference interval .    BUN 15 7 - 25 mg/dL   Creat 2.13 0.86 - 5.78 mg/dL   eGFR 80 > OR = 60 IO/NGE/9.52W4   BUN/Creatinine Ratio SEE NOTE: 6 - 22 (calc)    Comment:    Not Reported: BUN and Creatinine are within    reference range. .    Sodium 139 135 - 146 mmol/L   Potassium 4.1 3.5 - 5.3 mmol/L   Chloride 106 98 - 110 mmol/L   CO2 28 20 - 32 mmol/L   Calcium 9.4 8.6 - 10.3 mg/dL   Total Protein 6.9 6.1 - 8.1 g/dL   Albumin 4.1 3.6 - 5.1 g/dL   Globulin 2.8 1.9 - 3.7 g/dL (calc)   AG Ratio 1.5 1.0 - 2.5 (calc)   Total Bilirubin 0.4 0.2 - 1.2 mg/dL   Alkaline phosphatase (APISO) 61 35 - 144 U/L   AST 17 10 - 35 U/L   ALT 12 9 - 46 U/L  CBC with Differential/Platelet     Status: None   Collection Time: 05/30/23  9:39 AM  Result Value Ref Range   WBC 5.5 3.8 - 10.8 Thousand/uL   RBC 4.54 4.20 - 5.80 Million/uL   Hemoglobin 14.4 13.2 - 17.1 g/dL   HCT 13.2 44.0 - 10.2 %   MCV 96.0 80.0 - 100.0 fL   MCH 31.7 27.0 - 33.0 pg   MCHC 33.0 32.0 -  36.0 g/dL    Comment: For adults, a slight decrease in the calculated MCHC value (in the range of 30 to 32 g/dL) is most likely not clinically significant; however, it should be interpreted with caution in correlation with other red cell parameters and the patient's clinical condition.    RDW 12.5 11.0 - 15.0 %  Platelets 314 140 - 400 Thousand/uL   MPV 10.1 7.5 - 12.5 fL   Neutro Abs 3,685 1,500 - 7,800 cells/uL   Absolute Lymphocytes 1,106 850 - 3,900 cells/uL   Absolute Monocytes 561 200 - 950 cells/uL   Eosinophils Absolute 121 15 - 500 cells/uL   Basophils Absolute 28 0 - 200 cells/uL   Neutrophils Relative % 67 %   Total Lymphocyte 20.1 %   Monocytes Relative 10.2 %   Eosinophils Relative 2.2 %   Basophils Relative 0.5 %  RPR     Status: None   Collection Time: 05/30/23  9:39 AM  Result Value Ref Range   RPR Ser Ql NON-REACTIVE NON-REACTIVE    Comment: . No laboratory evidence of syphilis. If recent exposure is suspected, submit a new sample in 2-4 weeks. .   Interpretation:     Status: None   Collection Time: 05/30/23  9:39 AM  Result Value Ref Range   Interpretation      Comment: . Specific                        Level of Allergen IGE Class      kU/L             Specific IGE Antibody  -----         ---------        -------------------   0              <0.10           Absent/Undetectable   0/1        0.10-0.34           Very Low Level   1          0.35-0.69           Low Level   2          0.70-3.49           Moderate Level   3          3.50-17.4           High Level   4          17.5-49.9           Very High Level   5            50-100            Very High Level   6              >100            Very High Level . The clinical relevance of allergen results of 0.10-0.34 kU/L are undetermined and intended for  specialist use. . Allergens denoted with a "**" include results using one or more analyte specific reagents. In those cases, the test was developed and  its analytical performance characteristics have been determined by [employer]. It has not been cleared or approved by the U.S. Food and Drug Administration. This assay  has been v alidated pursuant to the [employer]  and is used for clinical purposes.     Assessment & Plan Acute pain of left knee Acute on chronic left knee pain with the recent STI within the last month -DDx: Flare of underlying osteoarthritis, reactive arthritis related to STI, potential Lyme arthritis related to remote tick bite, osteomyelitis related to HIV, other etiology  Plan: -Reviewed office visit notes with PCP from 05/30/2023  with findings summarized in the HPI -Reviewed labs ordered by PCP 05/30/2023 showing normal STI testing, normal CBC -Reviewed left knee x-rays 05/30/2023 showing moderate knee osteoarthritis -Given ongoing symptoms and significant pain ordered MRI left knee to rule out reactive arthritis, meniscus tear, osteomyelitis -Labs: Stat ESR, CRP, Lyme to further evaluate for possible reactive arthritis -Rx tramadol 50 mg 1-2 tabs p.o. nightly as needed severe pain, [place] PMP reviewed, no concerns.  Dispense 10 tabs, no refills -Will continue oral NSAIDs as needed.  He already has Naprosyn at home. -Follow-up pending labs and MRI -Red flag symptoms reviewed, patient understands when he should call or seek medical evaluation Primary osteoarthritis of left knee Acute on chronic left knee pain with known history of osteoarthritis -Uncertain whether current symptoms are related to progression of his osteoarthritis versus another cause as noted above  Plan: -Reviewed left knee x-rays 05/30/2023 showing moderate knee osteoarthritis -Given ongoing symptoms and significant pain ordered MRI left knee to rule out reactive arthritis, meniscus tear, osteomyelitis -Labs: Stat ESR, CRP, Lyme to further evaluate for possible reactive arthritis -Rx tramadol 50 mg 1-2 tabs p.o. nightly as needed severe pain, [place]  PMP reviewed, no concerns.  Dispense 10 tabs, no refills -Will continue oral NSAIDs as needed.  He already has Naprosyn at home. -Follow-up pending labs and MRI -Red flag symptoms reviewed, patient understands when he should call or seek medical evaluation Reactive arthritis of left knee [facility]) Patient with acute on chronic left knee pain, history of what sounds to be gonococcal arthritis when he was younger.  Recent STI within the last month, concerns for possible reactive arthritis  Plan: Plan: -Reviewed office visit notes with PCP from 05/30/2023 with findings summarized in the HPI -Reviewed labs ordered by PCP 05/30/2023 showing normal STI testing, normal CBC -Reviewed left knee x-rays 05/30/2023 showing moderate knee osteoarthritis -Given ongoing symptoms and significant pain ordered MRI left knee to rule out reactive arthritis, meniscus tear, osteomyelitis -Labs: Stat ESR, CRP, Lyme to further evaluate for possible reactive arthritis -Rx tramadol 50 mg 1-2 tabs p.o. nightly as needed severe pain, [place] PMP reviewed, no concerns.  Dispense 10 tabs, no refills -Will continue oral NSAIDs as needed.  He already has Naprosyn at home. -Follow-up pending labs and MRI -Red flag symptoms reviewed, patient understands when he should call or seek medical evaluation HIV disease (HCC) History of HIV, currently on treatment with Biktarvy -Now with acute on chronic left knee pain concerning for possible flare of underlying osteoarthritis versus reactive arthritis versus osteomyelitis  Plan: Plan: -Reviewed office visit notes with PCP from 05/30/2023 with findings summarized in the HPI -Reviewed labs ordered by PCP 05/30/2023 showing normal STI testing, normal CBC -Reviewed left knee x-rays 05/30/2023 showing moderate knee osteoarthritis -Given ongoing symptoms and significant pain ordered MRI left knee to rule out reactive arthritis, meniscus tear, osteomyelitis -Labs: Stat ESR, CRP, Lyme to further evaluate for  possible reactive arthritis -Rx tramadol 50 mg 1-2 tabs p.o. nightly as needed severe pain, [place] PMP reviewed, no concerns.  Dispense 10 tabs, no refills -Will continue oral NSAIDs as needed.  He already has Naprosyn at home. -Follow-up pending labs and MRI -Red flag symptoms reviewed, patient understands when he should call or seek medical evaluation  Patient expressed understanding & agreement with above.  Encounter Diagnoses  Name Primary?   Acute pain of left knee Yes   Primary osteoarthritis of left knee    Reactive arthritis of left knee (HCC)    HIV  disease (HCC)     Orders Placed This Encounter  Procedures   MR Knee Left  Wo Contrast   Sedimentation rate   C-reactive protein   Lyme Disease Serology w/Reflex

## 2023-06-02 ENCOUNTER — Encounter: Payer: Self-pay | Admitting: Family Medicine

## 2023-06-02 LAB — CBC WITH DIFFERENTIAL/PLATELET
Absolute Lymphocytes: 1106 {cells}/uL (ref 850–3900)
Absolute Monocytes: 561 {cells}/uL (ref 200–950)
Basophils Absolute: 28 {cells}/uL (ref 0–200)
Basophils Relative: 0.5 %
Eosinophils Absolute: 121 {cells}/uL (ref 15–500)
Eosinophils Relative: 2.2 %
HCT: 43.6 % (ref 38.5–50.0)
Hemoglobin: 14.4 g/dL (ref 13.2–17.1)
MCH: 31.7 pg (ref 27.0–33.0)
MCHC: 33 g/dL (ref 32.0–36.0)
MCV: 96 fL (ref 80.0–100.0)
MPV: 10.1 fL (ref 7.5–12.5)
Monocytes Relative: 10.2 %
Neutro Abs: 3685 {cells}/uL (ref 1500–7800)
Neutrophils Relative %: 67 %
Platelets: 314 10*3/uL (ref 140–400)
RBC: 4.54 10*6/uL (ref 4.20–5.80)
RDW: 12.5 % (ref 11.0–15.0)
Total Lymphocyte: 20.1 %
WBC: 5.5 10*3/uL (ref 3.8–10.8)

## 2023-06-02 LAB — ALPHA-GAL PANEL
Allergen, Mutton, f88: <0.1 kU/L
Allergen, Pork, f26: <0.1 kU/L
Beef: <0.1 kU/L
CLASS: 0
CLASS: 0
Class: 0
GALACTOSE-ALPHA-1,3-GALACTOSE IGE*: <0.1 kU/L (ref <0.10)

## 2023-06-02 LAB — COMPLETE METABOLIC PANEL WITH GFR
AG Ratio: 1.5 (calc) (ref 1.0–2.5)
ALT: 12 U/L (ref 9–46)
AST: 17 U/L (ref 10–35)
Albumin: 4.1 g/dL (ref 3.6–5.1)
Alkaline phosphatase (APISO): 61 U/L (ref 35–144)
BUN: 15 mg/dL (ref 7–25)
CO2: 28 mmol/L (ref 20–32)
Calcium: 9.4 mg/dL (ref 8.6–10.3)
Chloride: 106 mmol/L (ref 98–110)
Creat: 1.06 mg/dL (ref 0.70–1.35)
Globulin: 2.8 g/dL (ref 1.9–3.7)
Glucose, Bld: 92 mg/dL (ref 65–99)
Potassium: 4.1 mmol/L (ref 3.5–5.3)
Sodium: 139 mmol/L (ref 135–146)
Total Bilirubin: 0.4 mg/dL (ref 0.2–1.2)
Total Protein: 6.9 g/dL (ref 6.1–8.1)
eGFR: 80 mL/min/{1.73_m2} (ref >=60)

## 2023-06-02 LAB — C-REACTIVE PROTEIN: CRP: 1 mg/L (ref 0–10)

## 2023-06-02 LAB — HIV RNA, RTPCR W/R GT (RTI, PI,INT)
HIV 1 RNA Quant: <20 {copies}/mL — AB
HIV-1 RNA Quant, Log: <1.3 {Log_copies}/mL — AB

## 2023-06-02 LAB — SEDIMENTATION RATE: Sed Rate: 5 mm/h (ref 0–30)

## 2023-06-02 LAB — LYME DISEASE SEROLOGY W/REFLEX: Lyme Total Antibody EIA: NEGATIVE

## 2023-06-02 LAB — RPR: RPR Ser Ql: NONREACTIVE

## 2023-06-02 LAB — INTERPRETATION:

## 2023-06-02 NOTE — Progress Notes (Signed)
 ESR and CRP normal, Lyme pending.  Has MRI scheduled 06/04/23.  MyChart message sent.  Should keep MRI as scheduled.

## 2023-06-04 ENCOUNTER — Ambulatory Visit
Admission: RE | Admit: 2023-06-04 | Discharge: 2023-06-04 | Disposition: A | Discharge status: Home / Self Care | Source: Ambulatory Visit | Attending: Family Medicine | Admitting: Family Medicine

## 2023-06-04 DIAGNOSIS — M1712 Unilateral primary osteoarthritis, left knee: Secondary | ICD-10-CM

## 2023-06-04 DIAGNOSIS — M02362 Reiter's disease, left knee: Secondary | ICD-10-CM

## 2023-06-05 ENCOUNTER — Encounter: Payer: Self-pay | Admitting: Family Medicine

## 2023-06-05 DIAGNOSIS — M1712 Unilateral primary osteoarthritis, left knee: Secondary | ICD-10-CM | POA: Insufficient documentation

## 2023-06-06 ENCOUNTER — Ambulatory Visit: Admitting: Licensed Clinical Social Worker

## 2023-06-13 ENCOUNTER — Other Ambulatory Visit: Payer: Self-pay

## 2023-06-13 ENCOUNTER — Telehealth: Payer: Self-pay

## 2023-06-13 ENCOUNTER — Ambulatory Visit: Payer: MEDICAID | Admitting: Licensed Clinical Social Worker

## 2023-06-13 DIAGNOSIS — N528 Other male erectile dysfunction: Secondary | ICD-10-CM

## 2023-06-13 DIAGNOSIS — F331 Major depressive disorder, recurrent, moderate: Secondary | ICD-10-CM
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
## 2023-06-13 NOTE — Telephone Encounter (Signed)
 Called Mike Harrison to see if he would be okay with $10 cost, no answer. Left HIPAA compliant voicemail requesting callback.   Sandie Ano, RN

## 2023-06-13 NOTE — Telephone Encounter (Signed)
 Patient returned call, he would like a prescription for Viagra, but states it will not be covered by his Medicare. Says he used to participate in a voucher program at his clinic in [place] and received the medication directly through [employer].   Sandie Ano, RN

## 2023-06-13 NOTE — Telephone Encounter (Signed)
 Patient came for his counseling appointment and requesting a call from Dr. Clinton Gallant nurse. Best contact number [phone]

## 2023-06-13 NOTE — Telephone Encounter (Signed)
 Called [place] back, no answer. Left HIPAA compliant voicemail requesting callback.   Sandie Ano, RN

## 2023-06-14 ENCOUNTER — Other Ambulatory Visit: Payer: Self-pay

## 2023-06-14 ENCOUNTER — Telehealth: Payer: Self-pay | Admitting: Family Medicine

## 2023-06-14 ENCOUNTER — Other Ambulatory Visit (HOSPITAL_COMMUNITY): Payer: Self-pay

## 2023-06-14 DIAGNOSIS — M1712 Unilateral primary osteoarthritis, left knee: Secondary | ICD-10-CM

## 2023-06-14 MED ORDER — HYDROCODONE-ACETAMINOPHEN 5-325 MG PO TABS: 1.0000 | ORAL_TABLET | Freq: Four times a day (QID) | ORAL | 0 refills | Status: AC | PRN

## 2023-06-14 MED ORDER — SILDENAFIL CITRATE 50 MG PO TABS
ORAL_TABLET | ORAL | 3 refills | Status: AC
  Filled 2023-06-14: qty 20, 20d supply, fill #0

## 2023-06-14 NOTE — Telephone Encounter (Signed)
 PDMP reviewed.  Dr. Christella Hartigan also advised referral to ortho to discuss knee replacement - we can place referral if needed.  He can also set up appointment with Dr. Christella Hartigan if he wants to talk about other non-surgical options.  Avoiding NSAIDs with his biktarvy.  Will send in short course of hydrocodone - advise we do not refill this medication.  Do not take with the tramadol.

## 2023-06-14 NOTE — Addendum Note (Signed)
 Addended by: Linna Hoff D on: 06/14/2023 01:16 PM   Modules accepted: Orders

## 2023-06-14 NOTE — Telephone Encounter (Signed)
 Spoke with [place], he is okay with $10 cost and would like the Viagra sent to [facility]. Rx sent per Dr. Daiva Eves.   Sandie Ano, RN

## 2023-06-14 NOTE — Telephone Encounter (Signed)
-----   Message from [facility] sent at 06/14/2023  2:51 PM EDT ----- Regarding: FW: phone message Can you please advise on this pt of Bret's? Thanks! ----- Message ----- From: Lizbeth Bark Sent: 06/14/2023   1:37 PM EDT To: Rutha Bouchard, LAT Subject: phone message                                  Pt called asking for another medication sent to pharmacy, states Tramadol is not working for pain.

## 2023-06-14 NOTE — Addendum Note (Signed)
 Addended by: Rutha Bouchard E on: 06/14/2023 04:32 PM   Modules accepted: Orders

## 2023-06-19 ENCOUNTER — Ambulatory Visit: Admitting: Family Medicine

## 2023-06-26 ENCOUNTER — Telehealth: Payer: Self-pay

## 2023-06-26 NOTE — Telephone Encounter (Signed)
 Patient called office stating he just received notice stating his Disability has been denied. Is working with ortho for upcoming knee surgery. Is struggling with depression since his wife passed away.  Is waiting on appeal letter for MD to review. Advised patient reach out to ortho for assistance with disability claim since they are working with knee concerns. Verbalized understanding.  Juanita Laster, RMA

## 2023-06-27 ENCOUNTER — Ambulatory Visit: Admitting: Licensed Clinical Social Worker

## 2023-06-27 ENCOUNTER — Other Ambulatory Visit: Payer: Self-pay

## 2023-06-27 ENCOUNTER — Ambulatory Visit (HOSPITAL_COMMUNITY)
Admission: EM | Admit: 2023-06-27 | Discharge: 2023-06-30 | Disposition: A | Discharge status: Other Acute Inpatient Hospital | Attending: Psychiatry | Admitting: Psychiatry

## 2023-06-27 DIAGNOSIS — F411 Generalized anxiety disorder: Secondary | ICD-10-CM | POA: Diagnosis not present

## 2023-06-27 DIAGNOSIS — Z560 Unemployment, unspecified: Secondary | ICD-10-CM | POA: Insufficient documentation

## 2023-06-27 DIAGNOSIS — R9431 Abnormal electrocardiogram [ECG] [EKG]: Secondary | ICD-10-CM | POA: Diagnosis not present

## 2023-06-27 DIAGNOSIS — Z79624 Long term (current) use of inhibitors of nucleotide synthesis: Secondary | ICD-10-CM | POA: Insufficient documentation

## 2023-06-27 DIAGNOSIS — F331 Major depressive disorder, recurrent, moderate: Secondary | ICD-10-CM

## 2023-06-27 DIAGNOSIS — F1421 Cocaine dependence, in remission: Secondary | ICD-10-CM | POA: Diagnosis not present

## 2023-06-27 DIAGNOSIS — E785 Hyperlipidemia, unspecified: Secondary | ICD-10-CM | POA: Diagnosis not present

## 2023-06-27 DIAGNOSIS — F22 Delusional disorders: Secondary | ICD-10-CM | POA: Insufficient documentation

## 2023-06-27 DIAGNOSIS — R440 Auditory hallucinations: Secondary | ICD-10-CM

## 2023-06-27 DIAGNOSIS — F329 Major depressive disorder, single episode, unspecified: Secondary | ICD-10-CM | POA: Insufficient documentation

## 2023-06-27 DIAGNOSIS — B20 Human immunodeficiency virus [HIV] disease: Secondary | ICD-10-CM | POA: Diagnosis not present

## 2023-06-27 DIAGNOSIS — Z634 Disappearance and death of family member: Secondary | ICD-10-CM | POA: Diagnosis not present

## 2023-06-27 DIAGNOSIS — Z79899 Other long term (current) drug therapy: Secondary | ICD-10-CM | POA: Diagnosis not present

## 2023-06-27 DIAGNOSIS — F17200 Nicotine dependence, unspecified, uncomplicated: Secondary | ICD-10-CM | POA: Insufficient documentation

## 2023-06-27 LAB — COMPREHENSIVE METABOLIC PANEL WITH GFR
ALT: 19 U/L (ref 0–44)
AST: 20 U/L (ref 15–41)
Albumin: 4.1 g/dL (ref 3.5–5.0)
Alkaline Phosphatase: 57 U/L (ref 38–126)
Anion gap: 8 (ref 5–15)
BUN: 12 mg/dL (ref 6–20)
CO2: 24 mmol/L (ref 22–32)
Calcium: 9.4 mg/dL (ref 8.9–10.3)
Chloride: 108 mmol/L (ref 98–111)
Creatinine, Ser: 1.16 mg/dL (ref 0.61–1.24)
GFR, Estimated: >60 mL/min (ref >60)
Glucose, Bld: 81 mg/dL (ref 70–99)
Potassium: 4.2 mmol/L (ref 3.5–5.1)
Sodium: 140 mmol/L (ref 135–145)
Total Bilirubin: 1.8 mg/dL — ABNORMAL HIGH (ref 0.0–1.2)
Total Protein: 7 g/dL (ref 6.5–8.1)

## 2023-06-27 LAB — CBC WITH DIFFERENTIAL/PLATELET
Abs Immature Granulocytes: 0.02 10*3/uL (ref 0.00–0.07)
Basophils Absolute: 0.1 10*3/uL (ref 0.0–0.1)
Basophils Relative: 1 %
Eosinophils Absolute: 0.1 10*3/uL (ref 0.0–0.5)
Eosinophils Relative: 2 %
HCT: 42.9 % (ref 39.0–52.0)
Hemoglobin: 14.7 g/dL (ref 13.0–17.0)
Immature Granulocytes: 0 %
Lymphocytes Relative: 39 %
Lymphs Abs: 2.6 10*3/uL (ref 0.7–4.0)
MCH: 31.5 pg (ref 26.0–34.0)
MCHC: 34.3 g/dL (ref 30.0–36.0)
MCV: 92.1 fL (ref 80.0–100.0)
Monocytes Absolute: 0.7 10*3/uL (ref 0.1–1.0)
Monocytes Relative: 10 %
Neutro Abs: 3.2 10*3/uL (ref 1.7–7.7)
Neutrophils Relative %: 48 %
Platelets: 297 10*3/uL (ref 150–400)
RBC: 4.66 MIL/uL (ref 4.22–5.81)
RDW: 12.7 % (ref 11.5–15.5)
WBC: 6.6 10*3/uL (ref 4.0–10.5)
nRBC: 0 % (ref 0.0–0.2)

## 2023-06-27 LAB — ETHANOL: Alcohol, Ethyl (B): <10 mg/dL (ref <10)

## 2023-06-27 LAB — POCT URINE DRUG SCREEN - MANUAL ENTRY (I-SCREEN)
POC Amphetamine UR: NOT DETECTED
POC Buprenorphine (BUP): NOT DETECTED
POC Cocaine UR: NOT DETECTED
POC Marijuana UR: NOT DETECTED
POC Methadone UR: NOT DETECTED
POC Methamphetamine UR: NOT DETECTED
POC Morphine: NOT DETECTED
POC Oxazepam (BZO): NOT DETECTED
POC Oxycodone UR: NOT DETECTED
POC Secobarbital (BAR): NOT DETECTED

## 2023-06-27 LAB — TSH: TSH: 0.25 u[IU]/mL — ABNORMAL LOW (ref 0.350–4.500)

## 2023-06-27 MED ORDER — OLANZAPINE 10 MG IM SOLR: 5.0000 mg | Freq: Three times a day (TID) | INTRAMUSCULAR | Status: DC | PRN

## 2023-06-27 MED ORDER — ACETAMINOPHEN 325 MG PO TABS: 650.0000 mg | ORAL_TABLET | Freq: Four times a day (QID) | ORAL | Status: DC | PRN

## 2023-06-27 MED ORDER — OLANZAPINE 10 MG IM SOLR: 10.0000 mg | Freq: Three times a day (TID) | INTRAMUSCULAR | Status: DC | PRN

## 2023-06-27 MED ORDER — OLANZAPINE 5 MG PO TBDP: 5.0000 mg | ORAL_TABLET | Freq: Three times a day (TID) | ORAL | Status: DC | PRN

## 2023-06-27 MED ORDER — ALUM & MAG HYDROXIDE-SIMETH 200-200-20 MG/5ML PO SUSP: 30.0000 mL | ORAL | Status: DC | PRN

## 2023-06-27 MED ORDER — MAGNESIUM HYDROXIDE 400 MG/5ML PO SUSP: 30.0000 mL | Freq: Every day | ORAL | Status: DC | PRN
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
## 2023-06-27 NOTE — Progress Notes (Signed)
   06/27/23 1646  [facility] Triage Screening (Walk-ins at [facility] only)  How Did You Hear About [place]? Other (Comment) Academic librarian)  What Is the Reason for Your Visit/Call Today? Mike Harrison is a 63 year old male presenting to [facility] voluntarily with chief complaint of having a "depression, paranoid, psychotic episode" last night. Patient told his counselor at [facility] ID clinic what happened, and she recommended he come here for evaluation and possible hospitalization. Pt lives in an [place] and reports last night he thought he overheard some of the other residence making statements about "running a train on my baby mama and making a movie". Patient reports confronting one of the men at the [place], but he did not fight them. Patient reports that staff came over to talk with him and asked him to give a urine sample, but he refused and left. Patient denies being evicted from [place] [facility] but was told he needed to come for evaluation. Patient reports recording everything he heard and today he did not hear what he heard last night on the recording. Patient thought that the staff member altered the recording however he reports sending the recording to his messenger and again he could not hear anything that he heard last night. Patient denies SI, HI, AVH. Denies recent drug use. Reports hx of cocaine use has been in recovery for the past 14 years, states that he relapsed about 2 years ago. Pt also was a substance abuse counselor. Pt reports haivng paranoid thoughts since Wednesday. Pt has not taking medications in about two months. Was taking mirtazapine.  How Long Has This Been Causing You Problems? 1 wk - 1 month  Have You Recently Had Any Thoughts About Hurting Yourself? No  Are You Planning to Commit Suicide/Harm Yourself At This time? No  Have you Recently Had Thoughts About Hurting Someone Karolee Ohs? No  Are You Planning To Harm Someone At This Time? No  Physical Abuse Denies  Verbal Abuse Denies  Sexual Abuse  Denies  Exploitation of patient/patient's resources Denies  Self-Neglect Denies  Are you currently experiencing any auditory, visual or other hallucinations? No  Have You Used Any Alcohol or Drugs in the Past 24 Hours? No  Do you have any current medical co-morbidities that require immediate attention? No  Clinician description of patient physical appearance/behavior: calm  What Do You Feel Would Help You the Most Today? Treatment for Depression or other mood problem  If access to [facility] was not available, would you have sought care in the Emergency Department? No  Determination of Need Routine (7 days)  Options For Referral Medication Management;Outpatient Therapy

## 2023-06-27 NOTE — BH Assessment (Signed)
 Comprehensive Clinical Assessment (CCA) Note  06/27/2023 Mike Harrison [id number]  Disposition: [name], NP recommends pt to be admitted [facility]) for observation.   The patient demonstrates the following risk factors for suicide: Chronic risk factors for suicide include: psychiatric disorder of Paranoia (HCC) . Acute risk factors for suicide include: N/A. Protective factors for this patient include: positive social support and positive therapeutic relationship. Considering these factors, the overall suicide risk at this point appears to be no risk. Patient is not appropriate for outpatient follow up.  Mike Harrison is a 63 year old male who presents voluntary and unaccompanied to [facility] ([facility]). Clinician asked the pt, "what brought you to the hospital?" Pt reports, since Wednesday his roommate (at the [facility]) was acting weird; walking up and down the stairs, moving dining rooms chairs, bouncing up and down. Pt reports, he hasn't been able to get much sleep. Pt reports, with the housemates present he reached into his roommates pocket and pulled out a baggy with Fentanyl, Ice, Gabapentin, etc. Pt reports, he put the baggy back called the house manager and showed them the paraphernalia. Pt reports, his roommate was kicked out. Pt reports, having paranoia; hearing voice of his baby mama. Pt reports, he recorded people in the house; he walked around with his earbuds telling people to listen but they only heard people laughing and cleaning. Pt reports, he sent the recording to his messenger, charging his phone then listened to the recording. Pt reports, he heard the same thing. Per pt, he went upstairs in his room closed the door and played Pandora. Per pt, the housemates called the house manager to defuse the situation but he didn't want to talk. During the assessment, pt discussed physical ailments (arthritis in  knee), his employment status and need for disability. Pt denies, SI, HI, self-injurious behaviors and access to weapons.   Pt denies, substance use. Pt's UDS is negative. Pt reports, he met his psychiatrist Lelon Mast) today discussed events that occurred today, he was recommended to come to [facility] ([facility]). Pt reports, he has not had a medication change, his provider wants him to continue to take Remeron for a month before she makes a medication change. Per chart, pt was seen by a Child psychotherapist Administrator, arts)  at [facility] not a psychiatrist.   Pt presents alert in casual attire with normal speech and eye contact. Pt's mood was depressed. Pt's affect was congruent with mood. Pt's insight and judgement are fair.   Chief Complaint:  Chief Complaint  Patient presents with   Mental Health Evaluation   Paranoid   Visit Diagnosis: Paranoia (HCC).   CCA Screening, Triage and Referral (STR)  Patient Reported Information How did you hear about [place]? Other (Comment) Academic librarian)  What Is the Reason for Your Visit/Call Today? Mike Harrison is a 63 year old male presenting to [facility] voluntarily with chief complaint of having a "depression, paranoid, psychotic episode" last night. Patient told his counselor at [facility] ID clinic what happened, and she recommended he come here for evaluation and possible hospitalization. Pt lives in an [place] and reports last night he thought he overheard some of the other residence making statements about "running a train on my baby mama and making a movie". Patient reports confronting one of the men at the [place], but he did not fight them. Patient reports that staff came over to talk with him  and asked him to give a urine sample, but he refused and left. Patient denies being evicted from [place] [facility] but was told he needed to come for evaluation. Patient reports recording everything he heard and today he did  not hear what he heard last night on the recording. Patient thought that the staff member altered the recording however he reports sending the recording to his messenger and again he could not hear anything that he heard last night. Patient denies SI, HI, AVH. Denies recent drug use. Reports hx of cocaine use has been in recovery for the past 14 years, states that he relapsed about 2 years ago. Pt also was a substance abuse counselor. Pt reports haivng paranoid thoughts since Wednesday. Pt has not taking medications in about two months. Was taking mirtazapine.  How Long Has This Been Causing You Problems? 1 wk - 1 month  What Do You Feel Would Help You the Most Today? Treatment for Depression or other mood problem   Have You Recently Had Any Thoughts About Hurting Yourself? No  Are You Planning to Commit Suicide/Harm Yourself At This time? No   Flowsheet Row ED from 06/27/2023 in [facility] Office Visit from [facility]/30/2023 in [facility] ED from 03/19/2023 in [facility] Emergency Department at [facility]  C-SSRS RISK CATEGORY No Risk Error: Q3, 4, or 5 should not be populated when Q2 is No No Risk       Have you Recently Had Thoughts About Hurting Someone Karolee Ohs? No  Are You Planning to Harm Someone at This Time? No  Explanation: None.   Have You Used Any Alcohol or Drugs in the Past 24 Hours? No  How Long Ago Did You Use Drugs or Alcohol? None.  What Did You Use and How Much? None.   Do You Currently Have a Therapist/Psychiatrist? Yes  Name of Therapist/Psychiatrist: Name of Therapist/Psychiatrist: Pt reports, he's linked to [facility] for medication management.   Have You Been Recently Discharged From Any Office Practice or Programs? No  Explanation of Discharge From Practice/Program: NA    CCA Screening Triage Referral Assessment Type of Contact: Face-to-Face  Telemedicine Service  Delivery:   Is this Initial or Reassessment?   Date Telepsych consult ordered in CH[facility]:    Time Telepsych consult ordered in CHL:    Location of Assessment: [facility] Assessment Services  Provider Location: [facility] Assessment Services   Collateral Involvement: None.   Does Patient Have a Automotive engineer Guardian? No  Legal Guardian Contact Information: Pt is his own guardian.  Copy of Legal Guardianship Form: -- (Pt is his own guardian.)  Legal Guardian Notified of Arrival: -- (Pt is his own guardian.)  Legal Guardian Notified of Pending Discharge: -- (Pt is his own guardian.)  If Minor and Not Living with Parent(s), Who has Custody? Pt is an adult.  Is CPS involved or ever been involved? Never  Is APS involved or ever been involved? Never   Patient Determined To Be At Risk for Harm To Self or Others Based on Review of Patient Reported Information or Presenting Complaint? No  Method: No Plan  Availability of Means: No access or NA  Intent: Vague intent or NA  Notification Required: No need or identified person  Additional Information for Danger to Others Potential: -- (NA)  Additional Comments for Danger to Others Potential: None.  Are There Guns or Other  Weapons in Your Home? No  Types of Guns/Weapons: Pt denies, access to weapons.  Are These Weapons Safely Secured?                            -- (NA)  Who Could Verify You Are Able To Have These Secured: NA  Do You Have any Outstanding Charges, Pending Court Dates, Parole/Probation? NA  Contacted To Inform of Risk of Harm To Self or Others: Other: Comment (None.)    Does Patient Present under Involuntary Commitment? No    [place] of Residence: [place]   Patient Currently Receiving the Following Services: Medication Management   Determination of Need: Urgent (48 hours)   Options For Referral: Medication Management; Outpatient Therapy     CCA Biopsychosocial Patient Reported  Schizophrenia/Schizoaffective Diagnosis in Past: No   Strengths: Pt has support system.   Mental Health Symptoms Depression:  Tearfulness; Fatigue; Hopelessness; Worthlessness; Irritability (Guilt/blame: for allowing himself to fall.)   Duration of Depressive symptoms: Duration of Depressive Symptoms: Greater than two weeks   Mania:  None   Anxiety:   Worrying   Psychosis:  Hallucinations   Duration of Psychotic symptoms: Duration of Psychotic Symptoms: Less than six months   Trauma:  None   Obsessions:  None   Compulsions:  None   Inattention:  None   Hyperactivity/Impulsivity:  None   Oppositional/Defiant Behaviors:  Angry   Emotional Irregularity:  None   Other Mood/Personality Symptoms:  Pt reports, previous diagnoses of Depression and Anxiety.    Mental Status Exam Appearance and self-care  Stature:  Average   Weight:  Average weight   Clothing:  Casual   Grooming:  Normal   Cosmetic use:  None   Posture/gait:  Normal   Motor activity:  Not Remarkable   Sensorium  Attention:  Normal   Concentration:  Normal   Orientation:  X5   Recall/memory:  Normal   Affect and Mood  Affect:  Congruent   Mood:  Other (Comment) (Sad.)   Relating  Eye contact:  Normal   Facial expression:  Responsive   Attitude toward examiner:  Cooperative   Thought and Language  Speech flow: Normal   Thought content:  Appropriate to Mood and Circumstances   Preoccupation:  None   Hallucinations:  Auditory   Organization:  Patent examiner of Knowledge:  Fair   Intelligence:  Average   Abstraction:  Normal   Judgement:  Hospital doctor   Insight:  Fair   Decision Making:  Impulsive   Social Functioning  Social Maturity:  Impulsive   Social Judgement:  "Street Smart"   Stress  Stressors:  Other (Comment) (Pt reports, "life it self, where I'm at, knee messed up.")   Coping Ability:  Overwhelmed;  Deficient supports   Skill Deficits:  Decision making   Supports:  Support needed     Religion: Religion/Spirituality Are You A Religious Person?: Yes What is Your Religious Affiliation?: Pentecostal How Might This Affect Treatment?: NA  Leisure/Recreation: Leisure / Recreation Do You Have Hobbies?: Yes (None.) Leisure and Hobbies: Basketball, watcing TV, going out to eat, listening to live music.  Exercise/Diet: Exercise/Diet Do You Exercise?: No What Type of Exercise Do You Do?: Other (Comment) (NA) How Many Times a Week Do You Exercise?:  (NA) Have You Gained or Lost A Significant Amount of Weight in the Past Six Months?: No Number of Pounds  Lost?:  (NA) Do You Follow a Special Diet?: No Do You Have Any Trouble Sleeping?: No   CCA Employment/Education Employment/Work Situation: Employment / Work Situation Employment Situation: Unemployed Patient's Job has Been Impacted by Current Illness: No Has Patient ever Been in Equities trader?: No  Education: Education Is Patient Currently Attending School?: No Last Grade Completed: 12 Did You Attend College?: Yes What Type of College Degree Do you Have?: Pt reports, he went to [employer] for [employer] and in 07/31/2010 he went back to school for Substance use. Did You Have An Individualized Education Program (IIEP): No Did You Have Any Difficulty At School?: No Patient's Education Has Been Impacted by Current Illness: No   CCA Family/Childhood History Family and Relationship History: Family history Marital status: Widowed Widowed, when?: Pt reports, he was separated but his wife passed away in 30-Jul-2017 from a Fentanyl overdose after 45 mintues without oxygen. Pt reports, he has 10 grandkids (all boys) and a great grandson (also a boy.) Does patient have children?: Yes How many children?: 4 How is patient's relationship with their children?: Pt reports, close relationship with kids, he has a 77 month old son.  Childhood  History:  Childhood History By whom was/is the patient raised?: Mother Did patient suffer any verbal/emotional/physical/sexual abuse as a child?: No Did patient suffer from severe childhood neglect?: No Has patient ever been sexually abused/assaulted/raped as an adolescent or adult?: No Was the patient ever a victim of a crime or a disaster?: No Witnessed domestic violence?: No Has patient been affected by domestic violence as an adult?: No   CCA Substance Use Alcohol/Drug Use: Alcohol / Drug Use Pain Medications: See MAR Prescriptions: See MAR Over the Counter: See MAR History of alcohol / drug use?: Yes Longest period of sobriety (when/how long): 13 years. Negative Consequences of Use:  (NA) Withdrawal Symptoms: None    ASAM's:  Six Dimensions of Multidimensional Assessment  Dimension 1:  Acute Intoxication and/or Withdrawal Potential:      Dimension 2:  Biomedical Conditions and Complications:      Dimension 3:  Emotional, Behavioral, or Cognitive Conditions and Complications:     Dimension 4:  Readiness to Change:     Dimension 5:  Relapse, Continued use, or Continued Problem Potential:     Dimension 6:  Recovery/Living Environment:     ASAM Severity Score:    ASAM Recommended Level of Treatment:     Substance use Disorder (SUD)    Recommendations for Services/Supports/Treatments: Recommendations for Services/Supports/Treatments Recommendations For Services/Supports/Treatments: Other (Comment) (Pt to be admitted to [facility]) for observation.)  Disposition Recommendation per psychiatric provider: Pt to be admitted [facility] ([facility]) for observation.    DSM5 Diagnoses: Patient Active Problem List   Diagnosis Date Noted   Osteoarthritis of left knee 06/05/2023   Palpitations 05/30/2023   Moderate episode of recurrent major depressive disorder (HCC) 05/30/2023   Headache 05/30/2023    Diarrhea 04/09/2023   Foot pain 11/22/2022   Cocaine use disorder (HCC) 05/05/2022   Allergy to alpha-gal 11/10/2021   Toe pain    Onychomycosis    Allergic reaction to alpha-gal 02/12/2021   Arthritis    Pruritus    Asthma    Smoker    Hypertension 12/31/2020   Hyperlipidemia 12/31/2020   HIV disease (HCC) 09/02/2019     Referrals to Alternative Service(s): Referred to Alternative Service(s):   Place:   Date:   Time:  Referred to Alternative Service(s):   Place:   Date:   Time:    Referred to Alternative Service(s):   Place:   Date:   Time:    Referred to Alternative Service(s):   Place:   Date:   Time:     Redmond Pulling, [facility] Comprehensive Clinical Assessment (CCA) Screening, Triage and Referral Note  06/27/2023 Mike Harrison [id number]  Chief Complaint:  Chief Complaint  Patient presents with   Mental Health Evaluation   Paranoid   Visit Diagnosis:   Patient Reported Information How did you hear about [place]? Other (Comment) Academic librarian)  What Is the Reason for Your Visit/Call Today? Randle Shatzer is a 63 year old male presenting to [facility] voluntarily with chief complaint of having a "depression, paranoid, psychotic episode" last night. Patient told his counselor at [facility] ID clinic what happened, and she recommended he come here for evaluation and possible hospitalization. Pt lives [place] an [place] and reports last night he thought he overheard some of the other residence making statements about "running a train on my baby mama and making a movie". Patient reports confronting one of the men at the [place], but he did not fight them. Patient reports that staff came over to talk with him and asked him to give a urine sample, but he refused and left. Patient denies being evicted from [place] [facility] but was told he needed to come for evaluation. Patient reports recording everything he heard and today he did not hear what he heard last night on the recording. Patient thought  that the staff member altered the recording however he reports sending the recording to his messenger and again he could not hear anything that he heard last night. Patient denies SI, HI, AVH. Denies recent drug use. Reports hx of cocaine use has been in recovery for the past 14 years, states that he relapsed about 2 years ago. Pt also was a substance abuse counselor. Pt reports haivng paranoid thoughts since Wednesday. Pt has not taking medications in about two months. Was taking mirtazapine.  How Long Has This Been Causing You Problems? 1 wk - 1 month  What Do You Feel Would Help You the Most Today? Treatment for Depression or other mood problem   Have You Recently Had Any Thoughts About Hurting Yourself? No  Are You Planning to Commit Suicide/Harm Yourself At This time? No   Have you Recently Had Thoughts About Hurting Someone Karolee Ohs? No  Are You Planning to Harm Someone at This Time? No  Explanation: None.   Have You Used Any Alcohol or Drugs in the Past 24 Hours? No  How Long Ago Did You Use Drugs or Alcohol? None.  What Did You Use and How Much? None.   Do You Currently Have a Therapist/Psychiatrist? Yes  Name of Therapist/Psychiatrist: Pt reports, he's linked to [facility] for medication management.   Have You Been Recently Discharged From Any Office Practice or Programs? No  Explanation of Discharge From Practice/Program: NA   CCA Screening Triage Referral Assessment Type of Contact: Face-to-Face  Telemedicine Service Delivery:   Is this Initial or Reassessment?   Date Telepsych consult ordered in CHL:    Time Telepsych consult ordered in [facility]L:    Location of Assessment: [facility] Assessment Services  Provider Location: [facility] Assessment Services    Collateral Involvement: None.   Does Patient Have a Automotive engineer Guardian? No. Name and Contact of Legal Guardian: Pt is his own guardian.  If Minor and  Not Living with Parent(s), Who has Custody? Pt is an  adult.  Is CPS involved or ever been involved? Never  Is APS involved or ever been involved? Never   Patient Determined To Be At Risk for Harm To Self or Others Based on Review of Patient Reported Information or Presenting Complaint? No  Method: No Plan  Availability of Means: No access or NA  Intent: Vague intent or NA  Notification Required: No need or identified person  Additional Information for Danger to Others Potential: -- (NA)  Additional Comments for Danger to Others Potential: None.  Are There Guns or Other Weapons in Your Home? No  Types of Guns/Weapons: Pt denies, access to weapons.  Are These Weapons Safely Secured?                            -- (NA)  Who Could Verify You Are Able To Have These Secured: NA  Do You Have any Outstanding Charges, Pending Court Dates, Parole/Probation? NA  Contacted To Inform of Risk of Harm To Self or Others: Other: Comment (None.)   Does Patient Present under Involuntary Commitment? No    [place] of Residence: [place]   Patient Currently Receiving the Following Services: Medication Management   Determination of Need: Urgent (48 hours)   Options For Referral: Medication Management; Outpatient Therapy   Disposition Recommendation per psychiatric provider: Pt to be admitted [facility]) for observation.   Redmond Pulling, [facility]     Redmond Pulling, MS, [facility], [facility] Triage Specialist [phone]

## 2023-06-27 NOTE — Progress Notes (Signed)
   THERAPIST PROGRESS NOTE  Session Time: 35 minutes  Participation Level: Active  Behavioral Response: Fairly Groomed, Alert, Depressed  Type of Therapy: Individual Therapy  Treatment Goals addressed: Client and therapist discussed goals of mental health stabilization.  ProgressTowards Goals: Progressing  Interventions: Motivational Interviewing  Summary: Mike Harrison is a 63 y.o. male who presents with symptoms of depression and anxiety.  Suicidal/Homicidal: denies SI/HI/AVH.  Therapist Response: Therapist met with client for [facility]-IT to include ongoing assessment, support, and reinforcement. Therapist allowed client to "check in" since previous session; asking client to share any positive coping skills they may have used over the previous week, along with any challenges faced. Therapist provided supportive listening as client processed their thoughts, emotional responses, and behaviors surrounding several stressors.   Client reports he was recently asked to leave the [facility] he was in due to paranoia. Client reports he had issues with a roommate and he was awake for two days as the roommate was up and down. Client reports that he felt like he was hearing his baby's mothers voice who has passed. Client reports he started feeling paranoid and was recording things. Client reported he confronted the other members of the house about it and he ended up being asked to leave. Client reports that he was referred to the [facility] and plans on going there for medications and stabilization after this appointment.   Client was alert, oriented x3, with no SI, HI, or symptoms of psychosis (risk low). Client was pleasant and friendly, engaging openly and appropriately with therapist, benefiting from supportive listening and exploration of feelings. The client appeared to benefit from the therapist's non-judgmental posture, and client centered approach. Future INTX sessions scheduled. Next session  recommended in two weeks.   Plan: Return again in 2 weeks.  Diagnosis: Major Depressive Disorder  Collaboration of Care: Client asked therapist to reach out to [facility] managers to inform them he was going to the [facility]. Client signed a ROI for [facility]. Therapist attempted to reach the house manager and left a HIPAA compliant voicemail asking for a call back.   Patient was advised Release of Information must be obtained prior to any record release in order to collaborate their care with an outside provider. Patient/Guardian was advised if they have not already done so to contact the registration department to sign all necessary forms in order for [place] to release information regarding their care.   Consent: Patient gives verbal consent for treatment and assignment of benefits for services provided during this visit. Patient/Guardian expressed understanding and agreed to proceed.   Mauri Temkin [place], [place] 06/27/2023

## 2023-06-27 NOTE — ED Notes (Signed)
 Patient A&Ox4. Patient denies SI/HI and AVH at this time. Patient is pleasant. Patient oriented to unit and meal given. Patient denies any physical complaints when asked. No acute distress noted. Support and encouragement provided. Routine safety checks conducted according to facility protocol. Encouraged patient to notify staff if thoughts of harm toward self or others arise. Patient verbalize understanding and agreement. Will continue to monitor for safety.

## 2023-06-28 DIAGNOSIS — F22 Delusional disorders: Secondary | ICD-10-CM | POA: Diagnosis not present

## 2023-06-28 MED ORDER — MIRTAZAPINE 30 MG PO TABS
30.0000 mg | ORAL_TABLET | Freq: Every day | ORAL | Status: DC
Start: 2023-06-28 — End: 2023-06-30
  Administered 2023-06-28 – 2023-06-29 (×2): 30 mg via ORAL
  Filled 2023-06-28 (×2): qty 1

## 2023-06-28 MED ORDER — BICTEGRAVIR-EMTRICITAB-TENOFOV 50-200-25 MG PO TABS
1.0000 | ORAL_TABLET | Freq: Every day | ORAL | Status: DC
  Administered 2023-06-28 – 2023-06-30 (×3): 1 via ORAL
  Filled 2023-06-28 (×3): qty 1

## 2023-06-28 MED ORDER — VITAMIN D 25 MCG (1000 UNIT) PO TABS
2000.0000 [IU] | ORAL_TABLET | ORAL | Status: DC
  Administered 2023-06-28 – 2023-06-30 (×2): 2000 [IU] via ORAL
  Filled 2023-06-28 (×2): qty 2

## 2023-06-28 MED ORDER — ATORVASTATIN CALCIUM 10 MG PO TABS
20.0000 mg | ORAL_TABLET | Freq: Every day | ORAL | Status: DC
  Administered 2023-06-28 – 2023-06-30 (×3): 20 mg via ORAL
  Filled 2023-06-28 (×3): qty 2

## 2023-06-28 MED ORDER — HALOPERIDOL 2 MG PO TABS
2.0000 mg | ORAL_TABLET | Freq: Once | ORAL | Status: AC
  Administered 2023-06-28: 2 mg via ORAL
  Filled 2023-06-28: qty 1

## 2023-06-28 MED ORDER — TERBINAFINE HCL 250 MG PO TABS
250.0000 mg | ORAL_TABLET | Freq: Every day | ORAL | Status: DC
  Administered 2023-06-29 – 2023-06-30 (×2): 250 mg via ORAL
  Filled 2023-06-28 (×2): qty 1

## 2023-06-28 NOTE — ED Notes (Signed)
Patient observed resting quietly, eyes closed. Respirations equal and unlabored. Will continue to monitor for safety.  

## 2023-06-28 NOTE — ED Notes (Signed)
 Patient resting quietly in bed with eyes closed. Respirations equal and unlabored, skin warm and dry, NAD. Routine safety checks conducted according to facility protocol. Will continue to monitor for safety.

## 2023-06-28 NOTE — ED Provider Notes (Signed)
 Behavioral Health Progress Note  Date and Time: 06/28/2023 11:42 AM  Name: Mike Harrison  MRN:  [id number]  Subjective: "I'm doing okay. I have not heard any voices or see things this morning".  Mike Harrison has been seen this morning, chart reviewed. The chart findings discussed with the treatment.  He reports, "I came in here yesterday because I was hearing voices & seeing things other people were unable to hear or see. These has been going on for a week. This started last Wednesday & had worsened by Sunday.  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 I do not know the trigger.  And because I was thinking that people were plotting stuff against me, I even set my phone-up to catch the voices I was hearing so that others will believe me. I left the house after setting-up my phone. When I returned home, I took my phone to hear what was going on at my home when I was not there. I put om my ear piece, again I heard everything. Then, I called my family & my friends, played the voices that were recorded for them to hear. I told them that  I want them to hear what was going on when I was not in the house. After listening, everybody started looking at me & told me they did not hear any voices. They told me that there were no voices recorded. That was when I started getting worried.  I have been on mirtazapine 15 mg for a long time. Since this has been going on, sometimes I will feel disoriented.  I did not use any drugs or drink alcohol.  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  Again I have never been hospitalized in a psychiatric hospital in the past. I'm doing okay. I have not heard any voices or see things this morning.  I have been thinking since being here, could this be related to the month of April.  A lot of good and bad things happened to me on the month of April.  I was born in  April, married in April, and one of my family member was also born in April.  I am a widow, loved my wife very much.  But she died a while ago.  April month should have been our anniversary.  I been wondering if this could be what triggered all these.  I do not want to go to the hospital, I rather get better & discharge from here. Patient currently denies any SIHI, AVH, delusional thoughts or paranoia. He does not appear to be responding to any internal stimuli. Patient is in agreement to try a low dose of an antipsychotic medication for his symptoms.  Diagnosis:  Final diagnoses:  Auditory hallucination  Paranoia (HCC)   Total Time spent with patient: 30 minutes  Past Psychiatric History: GAD, MDD, cocaine use disorder. Past Medical History: HIV, Hyperlipidemia Family History: Family Psychiatric  History:  Patient denies any familial hx of mental illnesses. Social History: Widowed, has one daughter.  Additional Social History: Cocaine use disorder.   Pain Medications: See MAR Prescriptions: See MAR Over the Counter: See MAR History of alcohol / drug use?: Yes Longest period of sobriety (when/how long): 13 years. Negative Consequences of Use:  (NA) Withdrawal Symptoms: None  Sleep: Good  Appetite:  Good  Current Medications:  Current Facility-Administered Medications  Medication Dose Route Frequency Provider Last Rate Last Admin   acetaminophen (TYLENOL) tablet 650 mg  650 mg Oral Q6H PRN [name], NP       alum & mag hydroxide-simeth (MAALOX/MYLANTA) 200-200-20 MG/5ML suspension 30 mL  30 mL Oral Q4H PRN [name], NP       atorvastatin (LIPITOR) tablet 20 mg  20 mg Oral Daily [name], NP   20 mg at 06/28/23 0946   bictegravir-emtricitabine-tenofovir AF (BIKTARVY) 50-200-25 MG per tablet 1 tablet  1 tablet Oral Daily [name], NP   1 tablet at 06/28/23 0946   cholecalciferol (VITAMIN D3) 25 MCG (1000 UNIT) tablet 2,000 Units  2,000 Units Oral Lestine Mount, NP   2,000 Units at 06/28/23 0946   magnesium hydroxide (MILK OF MAGNESIA) suspension 30 mL  30 mL Oral Daily PRN [name], NP       mirtazapine (REMERON) tablet 30 mg  30 mg Oral QHS [name], NP       OLANZapine (ZYPREXA) injection 10 mg  10 mg Intramuscular TID PRN [name], NP       OLANZapine (ZYPREXA) injection 5 mg  5 mg Intramuscular TID PRN [name], NP       OLANZapine zydis (ZYPREXA) disintegrating tablet 5 mg  5 mg Oral TID PRN [name], NP       terbinafine (LAMISIL) tablet 250 mg  250 mg Oral Daily [name], NP       Current Outpatient Medications  Medication Sig Dispense Refill   atorvastatin (LIPITOR) 20 MG tablet Take 1 tablet (20 mg total) by mouth daily. 30 tablet 11   bictegravir-emtricitabine-tenofovir AF (BIKTARVY) 50-200-25 MG TABS tablet Take one tablet by moth every day 30 tablet 11   HYDROcodone-acetaminophen (NORCO/VICODIN) 5-325 MG tablet Take 1 tablet by mouth every 6 (six) hours as needed for moderate pain (pain score 4-6). 15 tablet 0   mirtazapine (REMERON) 15 MG tablet Take 2 tablets (30 mg total) by mouth at bedtime. 30 tablet 11   terbinafine (LAMISIL) 250 MG tablet Take 250 mg by mouth daily.     Vitamin D, Cholecalciferol, 25 MCG (1000 UT) TABS Take 2,000 Units by mouth every other day.     albuterol (VENTOLIN HFA) 108 (90 Base) MCG/ACT inhaler Inhale 2 puffs into the lungs every 6 (six) hours as needed for wheezing or shortness of breath. (Patient not taking: Reported on 06/28/2023)     sildenafil (VIAGRA) 50 MG tablet Take one tablet (50 mg) one hour before sex (Patient not taking: Reported on 06/28/2023) 20 tablet 3    Labs  Lab Results:  Admission on 06/27/2023  Component Date Value Ref Range Status   WBC 06/27/2023 6.6  4.0 - 10.5 K/uL Final   RBC 06/27/2023 4.66  4.22 - 5.81 MIL/uL Final   Hemoglobin 06/27/2023 14.7  13.0 - 17.0 g/dL Final   HCT 25/36/6440 42.9  39.0 - 52.0 % Final   MCV 06/27/2023 92.1  80.0 -  100.0 fL Final   MCH 06/27/2023 31.5  26.0 - 34.0 pg Final   MCHC 06/27/2023 34.3  30.0 - 36.0 g/dL Final   RDW 34/74/2595 12.7  11.5 - 15.5 % Final   Platelets 06/27/2023 297  150 - 400 K/uL Final   nRBC 06/27/2023 0.0  0.0 - 0.2 % Final   Neutrophils Relative % 06/27/2023 48  % Final   Neutro Abs 06/27/2023 3.2  1.7 - 7.7 K/uL Final  Lymphocytes Relative 06/27/2023 39  % Final   Lymphs Abs 06/27/2023 2.6  0.7 - 4.0 K/uL Final   Monocytes Relative 06/27/2023 10  % Final   Monocytes Absolute 06/27/2023 0.7  0.1 - 1.0 K/uL Final   Eosinophils Relative 06/27/2023 2  % Final   Eosinophils Absolute 06/27/2023 0.1  0.0 - 0.5 K/uL Final   Basophils Relative 06/27/2023 1  % Final   Basophils Absolute 06/27/2023 0.1  0.0 - 0.1 K/uL Final   Immature Granulocytes 06/27/2023 0  % Final   Abs Immature Granulocytes 06/27/2023 0.02  0.00 - 0.07 K/uL Final   Performed at [facility], [address], [place], [place] [address]   Sodium 06/27/2023 140  135 - 145 mmol/L Final   Potassium 06/27/2023 4.2  3.5 - 5.1 mmol/L Final   Chloride 06/27/2023 108  98 - 111 mmol/L Final   CO2 06/27/2023 24  22 - 32 mmol/L Final   Glucose, Bld 06/27/2023 81  70 - 99 mg/dL Final   Glucose reference range applies only to samples taken after fasting for at least 8 hours.   BUN 06/27/2023 12  6 - 20 mg/dL Final   Creatinine, Ser 06/27/2023 1.16  0.61 - 1.24 mg/dL Final   Calcium 19/14/7829 9.4  8.9 - 10.3 mg/dL Final   Total Protein 56/21/3086 7.0  6.5 - 8.1 g/dL Final   Albumin 57/84/6962 4.1  3.5 - 5.0 g/dL Final   AST 95/28/4132 20  15 - 41 U/L Final   ALT 06/27/2023 19  0 - 44 U/L Final   Alkaline Phosphatase 06/27/2023 57  38 - 126 U/L Final   Total Bilirubin 06/27/2023 1.8 (H)  0.0 - 1.2 mg/dL Final   GFR, Estimated 06/27/2023 >60  >60 mL/min Final   Comment: (NOTE) Calculated using the CKD-EPI Creatinine Equation (2021)    Anion gap 06/27/2023 8  5 - 15 Final   Performed at [facility],  [address], [place], [place] [address]   Alcohol, Ethyl (B) 06/27/2023 <10  <10 mg/dL Final   Comment: (NOTE) Lowest detectable limit for serum alcohol is 10 mg/dL.  For medical purposes only. Performed at [facility], [address], [place], [place] [address]    TSH 06/27/2023 0.250 (L)  0.350 - 4.500 uIU/mL Final   Comment: Performed by a 3rd Generation assay with a functional sensitivity of <=0.01 uIU/mL. Performed at [facility], [address], [place], [place] [address]    POC Amphetamine UR 06/27/2023 None Detected  NONE DETECTED (Cut Off Level 1000 ng/mL) Final   POC Secobarbital (BAR) 06/27/2023 None Detected  NONE DETECTED (Cut Off Level 300 ng/mL) Final   POC Buprenorphine (BUP) 06/27/2023 None Detected  NONE DETECTED (Cut Off Level 10 ng/mL) Final   POC Oxazepam (BZO) 06/27/2023 None Detected  NONE DETECTED (Cut Off Level 300 ng/mL) Final   POC Cocaine UR 06/27/2023 None Detected  NONE DETECTED (Cut Off Level 300 ng/mL) Final   POC Methamphetamine UR 06/27/2023 None Detected  NONE DETECTED (Cut Off Level 1000 ng/mL) Final   POC Morphine 06/27/2023 None Detected  NONE DETECTED (Cut Off Level 300 ng/mL) Final   POC Methadone UR 06/27/2023 None Detected  NONE DETECTED (Cut Off Level 300 ng/mL) Final   POC Oxycodone UR 06/27/2023 None Detected  NONE DETECTED (Cut Off Level 100 ng/mL) Final   POC Marijuana UR 06/27/2023 None Detected  NONE DETECTED (Cut Off Level 50 ng/mL) Final  Office Visit on 06/01/2023  Component Date Value Ref Range Status   Sed Rate 06/01/2023 5  0 - 30 mm/hr Final   CRP 06/01/2023 1  0 - 10 mg/L Final   Lyme Total Antibody EIA 06/01/2023 Negative  Negative Final   Comment: Lyme antibodies not detected. Reflex testing is not indicated. No laboratory evidence of infection with B. burgdorferi (Lyme disease). Negative results may occur in patients recently infected (less than or equal to 14 days) with B. burgdorferi.  If recent infection  is suspected, repeat testing on a new sample collected in 7 to 14 days is recommended.   Office Visit on 05/30/2023  Component Date Value Ref Range Status   CD4 T Cell Abs 05/30/2023 204 (L)  400 - 1,790 /uL Final   CD4 % Helper T Cell 05/30/2023 19 (L)  33 - 65 % Final   Performed at [facility], [address], [place], [place] [address]   HIV 1 RNA Quant 05/30/2023 <20 DETECTED (A)  copies/mL Final   Comment: HIV-1 RNA was detected below 20 copies/mL. Viral nucleic acid detected below this level cannot be quantified by the assay. Marland Kitchen    HIV-1 RNA Quant, Log 05/30/2023 <1.30 DETECTED (A)  Log copies/mL Final   Comment: REFERENCE RANGE:           NOT DETECTED  copies/mL           NOT DETECTED  Log copies/mL . This test was performed using Real-Time Polymerase Chain Reaction. . Reportable range is 20 to 10,000,000 copies/mL (1.30-7.00 Log copies/mL).    Glucose, Bld 05/30/2023 92  65 - 99 mg/dL Final   Comment: .            Fasting reference interval .    BUN 05/30/2023 15  7 - 25 mg/dL Final   Creat 21/30/8657 1.06  0.70 - 1.35 mg/dL Final   eGFR 84/69/6295 80  > OR = 60 mL/min/1.85m2 Final   BUN/Creatinine Ratio 05/30/2023 SEE NOTE:  6 - 22 (calc) Final   Comment:    Not Reported: BUN and Creatinine are within    reference range. .    Sodium 05/30/2023 139  135 - 146 mmol/L Final   Potassium 05/30/2023 4.1  3.5 - 5.3 mmol/L Final   Chloride 05/30/2023 106  98 - 110 mmol/L Final   CO2 05/30/2023 28  20 - 32 mmol/L Final   Calcium 05/30/2023 9.4  8.6 - 10.3 mg/dL Final   Total Protein 28/41/3244 6.9  6.1 - 8.1 g/dL Final   Albumin 03/30/7251 4.1  3.6 - 5.1 g/dL Final   Globulin 66/44/0347 2.8  1.9 - 3.7 g/dL (calc) Final   AG Ratio 05/30/2023 1.5  1.0 - 2.5 (calc) Final   Total Bilirubin 05/30/2023 0.4  0.2 - 1.2 mg/dL Final   Alkaline phosphatase (APISO) 05/30/2023 61  35 - 144 U/L Final   AST 05/30/2023 17  10 - 35 U/L Final   ALT 05/30/2023 12  9  - 46 U/L Final   WBC 05/30/2023 5.5  3.8 - 10.8 Thousand/uL Final   RBC 05/30/2023 4.54  4.20 - 5.80 Million/uL Final   Hemoglobin 05/30/2023 14.4  13.2 - 17.1 g/dL Final   HCT 42/59/5638 43.6  38.5 - 50.0 % Final   MCV 05/30/2023 96.0  80.0 - 100.0 fL Final   MCH 05/30/2023 31.7  27.0 - 33.0 pg Final   MCHC 05/30/2023 33.0  32.0 - 36.0 g/dL Final  Comment: For adults, a slight decrease in the calculated MCHC value (in the range of 30 to 32 g/dL) is most likely not clinically significant; however, it should be interpreted with caution in correlation with other red cell parameters and the patient's clinical condition.    RDW 05/30/2023 12.5  11.0 - 15.0 % Final   Platelets 05/30/2023 314  140 - 400 Thousand/uL Final   MPV 05/30/2023 10.1  7.5 - 12.5 fL Final   Neutro Abs 05/30/2023 3,685  1,500 - 7,800 cells/uL Final   Absolute Lymphocytes 05/30/2023 1,106  850 - 3,900 cells/uL Final   Absolute Monocytes 05/30/2023 561  200 - 950 cells/uL Final   Eosinophils Absolute 05/30/2023 121  15 - 500 cells/uL Final   Basophils Absolute 05/30/2023 28  0 - 200 cells/uL Final   Neutrophils Relative % 05/30/2023 67  % Final   Total Lymphocyte 05/30/2023 20.1  % Final   Monocytes Relative 05/30/2023 10.2  % Final   Eosinophils Relative 05/30/2023 2.2  % Final   Basophils Relative 05/30/2023 0.5  % Final   RPR Ser Ql 05/30/2023 NON-REACTIVE  NON-REACTIVE Final   Comment: . No laboratory evidence of syphilis. If recent exposure is suspected, submit a new sample in 2-4 weeks. .    Neisseria Gonorrhea 05/30/2023 Negative   Final   Chlamydia 05/30/2023 Negative   Final   Comment 05/30/2023 Normal Reference Ranger Chlamydia - Negative   Final   Comment 05/30/2023 Normal Reference Range Neisseria Gonorrhea - Negative   Final   Beef 05/30/2023 <0.10  kU/L Final   CLASS 05/30/2023 0   Final   Allergen, Mutton, f88 05/30/2023 <0.10  kU/L Final   Class 05/30/2023 0   Final   Allergen, Pork, f26  05/30/2023 <0.10  kU/L Final   CLASS 05/30/2023 0   Final   GALACTOSE-ALPHA-1,3-GALACTOSE IGE* 05/30/2023 <0.10  <0.10 kU/L Final   Comment: . Results above 0.1 kU/L indicate an allergen-specific IgE sensitization to galactose-a-1,3-galactose, and such patients are at risk for delayed allergic reactions following beef, pork, or lamb consumption. Circulating IgE antibodies may remain undetectable despite a convincing clinical history because these antibodies may be directed towards allergens revealed or altered during industrial processing, cooking, or digestion and therefore do not exist in the original food for which the patient is tested. Sometimes individuals diagnosed with chronic urticaria may develop IgE antibodies directed against human thyroglobulin. Such antibodies may cross-react with the bovine thyroglobulin used in ImmunoCAP(R) Allergen o215, alpha-Gal, leading to a false-positive test result. A definitive diagnosis should be based on the evaluation of both clinical and laboratory findings and not on any single diagnostic method. Additional information can be found at [email or web address].com    Neisseria Gonorrhea 05/30/2023 Negative   Final   Chlamydia 05/30/2023 Negative   Final   Comment 05/30/2023 Normal Reference Ranger Chlamydia - Negative   Final   Comment 05/30/2023 Normal Reference Range Neisseria Gonorrhea - Negative   Final   Interpretation 05/30/2023    Final   Comment: . Specific                        Level of Allergen IGE Class      kU/L             Specific IGE Antibody  -----         ---------        -------------------   0              <  0.10           Absent/Undetectable   0/1        0.10-0.34           Very Low Level   1          0.35-0.69           Low Level   2          0.70-3.49           Moderate Level   3          3.50-17.4           High Level   4          17.5-49.9           Very High Level   5            50-100            Very High Level   6               >100            Very High Level . The clinical relevance of allergen results of 0.10-0.34 kU/L are undetermined and intended for  specialist use. . Allergens denoted with a "**" include results using one or more analyte specific reagents. In those cases, the test was developed and its analytical performance characteristics have been determined by [employer]. It has not been cleared or approved by the U.S. Food and Drug Administration. This assay  has been v                          alidated pursuant to the [employer]  and is used for clinical purposes.   Admission on 01/11/2023, Discharged on 01/11/2023  Component Date Value Ref Range Status   Specimen Source 01/11/2023 URINE, CLEAN CATCH   Final   Color, Urine 01/11/2023 YELLOW  YELLOW Final   APPearance 01/11/2023 CLEAR  CLEAR Final   Specific Gravity, Urine 01/11/2023 1.023  1.005 - 1.030 Final   pH 01/11/2023 5.5  5.0 - 8.0 Final   Glucose, UA 01/11/2023 NEGATIVE  NEGATIVE mg/dL Final   Hgb urine dipstick 01/11/2023 NEGATIVE  NEGATIVE Final   Bilirubin Urine 01/11/2023 NEGATIVE  NEGATIVE Final   Ketones, ur 01/11/2023 NEGATIVE  NEGATIVE mg/dL Final   Protein, ur 24/40/1027 NEGATIVE  NEGATIVE mg/dL Final   Nitrite 25/36/6440 NEGATIVE  NEGATIVE Final   Leukocytes,Ua 01/11/2023 NEGATIVE  NEGATIVE Final   RBC / HPF 01/11/2023 0-5  0 - 5 RBC/hpf Final   WBC, UA 01/11/2023 0-5  0 - 5 WBC/hpf Final   Comment:        Reflex urine culture not performed if WBC <=10, OR if Squamous epithelial cells >5. If Squamous epithelial cells >5 suggest recollection.    Bacteria, UA 01/11/2023 NONE SEEN  NONE SEEN Final   Squamous Epithelial / HPF 01/11/2023 0-5  0 - 5 /HPF Final   Mucus 01/11/2023 PRESENT   Final   Performed at [facility], [address], [place], [place] [address]   Sodium 01/11/2023 139  135 - 145 mmol/L Final   Potassium 01/11/2023 3.7  3.5 - 5.1 mmol/L Final   Chloride  01/11/2023 106  98 - 111 mmol/L Final   CO2 01/11/2023 27  22 - 32 mmol/L Final   Glucose, Bld 01/11/2023 93  70 - 99 mg/dL Final  Glucose reference range applies only to samples taken after fasting for at least 8 hours.   BUN 01/11/2023 13  6 - 20 mg/dL Final   Creatinine, Ser 01/11/2023 1.09  0.61 - 1.24 mg/dL Final   Calcium 76/16/0737 9.3  8.9 - 10.3 mg/dL Final   Total Protein 10/62/6948 7.3  6.5 - 8.1 g/dL Final   Albumin 54/62/7035 4.2  3.5 - 5.0 g/dL Final   AST 00/93/8182 13 (L)  15 - 41 U/L Final   ALT 01/11/2023 12  0 - 44 U/L Final   Alkaline Phosphatase 01/11/2023 68  38 - 126 U/L Final   Total Bilirubin 01/11/2023 0.8  0.3 - 1.2 mg/dL Final   GFR, Estimated 01/11/2023 >60  >60 mL/min Final   Comment: (NOTE) Calculated using the CKD-EPI Creatinine Equation (2021)    Anion gap 01/11/2023 6  5 - 15 Final   Performed at [facility], [address], [place], [place] [address]   Specimen Description 01/11/2023    Final                   Value:URINE, CLEAN CATCH Performed at [facility], [address] [place], [place] [address]    Special Requests 01/11/2023    Final                   Value:NONE Performed at [facility], [address], [place], [place] [address]    Culture 01/11/2023    Final                   Value:NO GROWTH Performed at [facility], [address] [place], [place] [address]    Report Status 01/11/2023 01/12/2023 FINAL   Final  Admission on 01/02/2023, Discharged on 01/02/2023  Component Date Value Ref Range Status   Color, UA 01/02/2023 yellow  yellow Final   Clarity, UA 01/02/2023 clear  clear Final   Glucose, UA 01/02/2023 negative  negative mg/dL Final   Bilirubin, UA 25/85/2778 negative  negative Final   Ketones, POC UA 01/02/2023 negative  negative mg/dL Final   Spec Grav, UA 24/23/5361 1.025  1.010 - 1.025 Final   Blood, UA 01/02/2023 negative  negative Final   pH, UA  01/02/2023 6.0  5.0 - 8.0 Final   Protein Ur, POC 01/02/2023 negative  negative mg/dL Final   Urobilinogen, UA 01/02/2023 1.0  0.2 or 1.0 E.U./dL Final   Nitrite, UA 44/31/5400 Negative  Negative Final   Leukocytes, UA 01/02/2023 Negative  Negative Final   WBC 01/02/2023 7.6  4.0 - 10.5 K/uL Final   RBC 01/02/2023 4.69  4.22 - 5.81 MIL/uL Final   Hemoglobin 01/02/2023 14.9  13.0 - 17.0 g/dL Final   HCT 86/76/1950 43.0  39.0 - 52.0 % Final   MCV 01/02/2023 91.7  80.0 - 100.0 fL Final   MCH 01/02/2023 31.8  26.0 - 34.0 pg Final   MCHC 01/02/2023 34.7  30.0 - 36.0 g/dL Final   RDW 93/26/7124 12.2  11.5 - 15.5 % Final   Platelets 01/02/2023 339  150 - 400 K/uL Final   nRBC 01/02/2023 0.0  0.0 - 0.2 % Final   Neutrophils Relative % 01/02/2023 70  % Final   Neutro Abs 01/02/2023 5.3  1.7 - 7.7 K/uL Final   Lymphocytes Relative 01/02/2023 16  % Final   Lymphs Abs 01/02/2023 1.2  0.7 - 4.0 K/uL Final   Monocytes Relative 01/02/2023 10  % Final  Monocytes Absolute 01/02/2023 0.8  0.1 - 1.0 K/uL Final   Eosinophils Relative 01/02/2023 2  % Final   Eosinophils Absolute 01/02/2023 0.2  0.0 - 0.5 K/uL Final   Basophils Relative 01/02/2023 1  % Final   Basophils Absolute 01/02/2023 0.1  0.0 - 0.1 K/uL Final   Immature Granulocytes 01/02/2023 1  % Final   Abs Immature Granulocytes 01/02/2023 0.04  0.00 - 0.07 K/uL Final   Performed at [facility], [address] [place], [place] [address]   Neisseria Gonorrhea 01/02/2023 Negative   Final   Chlamydia 01/02/2023 Negative   Final   Trichomonas 01/02/2023 Negative   Final   Comment 01/02/2023 Normal Reference Range Trichomonas - Negative   Final   Comment 01/02/2023 Normal Reference Ranger Chlamydia - Negative   Final   Comment 01/02/2023 Normal Reference Range Neisseria Gonorrhea - Negative   Final    Blood Alcohol level:  Lab Results  Component Value Date   ETH <10 06/27/2023   ETH <10 05/05/2022    Metabolic Disorder Labs: Lab  Results  Component Value Date   HGBA1C 5.2 05/05/2022   MPG 102.54 05/05/2022   Lab Results  Component Value Date   PROLACTIN 11.4 05/05/2022   Lab Results  Component Value Date   CHOL 149 11/22/2022   TRIG 77 11/22/2022   HDL 54 11/22/2022   CHOLHDL 2.8 11/22/2022   VLDL 30 05/05/2022   LDLCALC 79 11/22/2022   LDLCALC 46 05/05/2022    Therapeutic Lab Levels: No results found for: "LITHIUM" No results found for: "VALPROATE" No results found for: "CBMZ"  Physical Findings   PHQ2-9    Flowsheet Row Office Visit from 05/30/2023 in [facility] [place] Office Visit from 11/22/2022 in [facility] [place]. [facility] ED from 05/05/2022 in [facility] Office Visit from 11/10/2021 in [facility] Of [place]. [facility]  PHQ-2 Total Score 4 0 4 0  PHQ-9 Total Score 19 -- 13 --      Flowsheet Row ED from 06/27/2023 in [facility] Office Visit from 05/30/2023 in [facility] [place]. [facility] ED from 03/19/2023 in [facility] Department at [facility]  C-SSRS RISK CATEGORY No Risk Error: Q3, 4, or 5 should not be populated when Q2 is No No Risk        Musculoskeletal  Strength & Muscle Tone: within normal limits Gait & Station: normal Patient leans: N/A  Psychiatric Specialty Exam  Presentation  General Appearance:  Appropriate for Environment  Eye Contact: Good  Speech: Clear and Coherent  Speech Volume: Normal  Handedness: Right   Mood and Affect  Mood: Depressed  Affect: Congruent   Thought Process  Thought Processes: Coherent  Descriptions of Associations:Intact  Orientation:Full (Time, Place and Person)  Thought Content:WDL  Diagnosis of Schizophrenia or Schizoaffective disorder in past: No   Duration of Psychotic Symptoms: Less than six months   Hallucinations:Hallucinations: Auditory Description of Auditory Hallucinations: thought he heard his girlfriend at the [employer]  Ideas of Reference:Paranoia  Suicidal Thoughts:Suicidal Thoughts: No  Homicidal Thoughts:Homicidal Thoughts: No  Sensorium  Memory: Immediate Fair  Judgment: Fair  Insight: Fair   Art therapist  Concentration: Fair  Attention Span: Fair  Recall: Jennelle Human of Knowledge: Good  Language: Good  Psychomotor Activity  Psychomotor Activity: Psychomotor Activity: Normal  Assets  Assets: Desire for Improvement; Housing; Vocational/Educational  Sleep  Sleep: Sleep: Fair Number of Hours of Sleep: 6  Nutritional Assessment (For OBS and [facility] admissions only) Has the patient had a weight loss or gain of 10 pounds or more in the last 3 months?: No Has the patient had a decrease in food intake/or appetite?: No Does the patient have dental problems?: No Does the patient have eating habits or behaviors that may be indicators of an eating disorder including binging or inducing vomiting?: No Has the patient recently lost weight without trying?: 0 Has the patient been eating poorly because of a decreased appetite?: 0 Malnutrition Screening Tool Score: 0   Physical Exam  Physical Exam Cardiovascular:     Rate and Rhythm: Normal rate.     Pulses: Normal pulses.  Pulmonary:     Effort: Pulmonary effort is normal.  Genitourinary:    Comments: Deferred Musculoskeletal:        General: Normal range of motion.     Cervical back: Normal range of motion.  Skin:    General: Skin is warm and dry.  Neurological:     General: No focal deficit present.     Mental Status: He is alert and oriented to person, place, and time.    Review of Systems  Constitutional:  Negative for chills, diaphoresis and fever.  HENT:  Negative for congestion and sore throat.   Respiratory:  Negative  for cough, shortness of breath and wheezing.   Cardiovascular:  Negative for chest pain and palpitations.  Gastrointestinal:  Negative for abdominal pain, constipation, diarrhea, heartburn, nausea and vomiting.  Musculoskeletal:  Negative for joint pain and myalgias.  Neurological:  Negative for dizziness, tingling, tremors, sensory change, speech change, focal weakness, seizures, loss of consciousness, weakness and headaches.  Endo/Heme/Allergies:        Allergies: Sulfa drugs, Lisinopril, hydrochlorothiazide.  Psychiatric/Behavioral:  Negative for depression, hallucinations, memory loss, substance abuse (Hx cocaine use disorder.) and suicidal ideas. The patient is not nervous/anxious and does not have insomnia.    Blood pressure 115/72, pulse 73, temperature 97.6 F (36.4 C), temperature source Oral, resp. rate 18, SpO2 97%. There is no height or weight on file to calculate BMI.  Treatment Plan Summary: Daily contact with patient to assess and evaluate symptoms and progress in treatment and Medication management.   Continue Atorvastatin 20 mg po daily for hyperlipidemia.  Continue Biktarvy 200-25 -50 mg for HIV.  Continue Vitamin D-3 2,00 unit po daily for bone health.  Continue Lamisil 250 mg po daily for fungal infection.  Initiated Haldol 2 mg once now for psychosis.  Continue Haldol 5 mg po Q bedtime for    [name], NP, pmhnp, fnp-bc. 06/28/2023 11:42 AM

## 2023-06-28 NOTE — ED Notes (Signed)
 Pt sleeping at this time. Rise and fall of chest noted. Pt in NAD at this time. Will continue to monitor.

## 2023-06-28 NOTE — ED Notes (Signed)
 Pt sleeping@this  time breathing even and unlabored will continue to monitor for safety

## 2023-06-28 NOTE — ED Notes (Signed)
 Patient is alert & oriented X 4. He denies SI/HI or current AVH. He endorses intermittent depressive episodes. Denies anxiety currently. He denies physical pain or discomfort. Patient observed interacting with staff and peers appropriately. He took his medications without any issues. No additional needs at this time. We will continue to monitor for safety.

## 2023-06-29 ENCOUNTER — Ambulatory Visit: Admitting: Orthopedic Surgery

## 2023-06-29 DIAGNOSIS — F22 Delusional disorders: Secondary | ICD-10-CM | POA: Diagnosis not present

## 2023-06-29 MED ORDER — HALOPERIDOL 5 MG PO TABS
5.0000 mg | ORAL_TABLET | Freq: Every day | ORAL | Status: DC
  Administered 2023-06-29: 5 mg via ORAL
  Filled 2023-06-29: qty 1

## 2023-06-29 MED ORDER — BENZTROPINE MESYLATE 0.5 MG PO TABS
0.5000 mg | ORAL_TABLET | Freq: Every day | ORAL | Status: DC
  Administered 2023-06-29: 0.5 mg via ORAL
  Filled 2023-06-29: qty 1

## 2023-06-29 NOTE — Progress Notes (Signed)
 Pt is awake, alert and oriented X3. Pt complained of knee pain but declined intervention when offered. No signs of acute distress noted. Administered scheduled meds per order. Pt denies current SI/HI/AVH, plan or intent. Staff will monitor for pt's safety.

## 2023-06-29 NOTE — ED Notes (Signed)
 Pt sleeping@this  time breathing even and unlabored will continue to monitor for safety

## 2023-06-29 NOTE — ED Provider Notes (Signed)
 Today's patient assessment note:  Pt with flat affect and depressed mood, attention to personal hygiene and grooming is fair, eye contact is good, speech is clear & coherent. Thought contents are organized and logical, and pt currently denies SI/HI/AVH or paranoia. There is no evidence of delusional thoughts.   The patient is currently stable for discharge, provided consent to contact his [facility] to see if he can return there.  Writer called 3 times, there was no answer, restarted on Haldol 5 mg nightly, as previously it was just 2 mg x 1 dose given yesterday.  Cogentin 0.5 mg nightly also started for EPS prophylaxis.  Provider tomorrow to reassess, and attempt calling [facility] again, and discharge patient on 4/4 if patient continues to maintain stability.

## 2023-06-29 NOTE — ED Notes (Signed)
 Pt is currently sleeping, no distress noted, environmental check complete, will continue to monitor patient for safety.

## 2023-06-30 DIAGNOSIS — F22 Delusional disorders: Secondary | ICD-10-CM | POA: Diagnosis not present

## 2023-06-30 MED ORDER — HALOPERIDOL 5 MG PO TABS: 5.0000 mg | ORAL_TABLET | Freq: Every day | ORAL | 0 refills | Status: AC

## 2023-06-30 MED ORDER — BENZTROPINE MESYLATE 0.5 MG PO TABS: 0.5000 mg | ORAL_TABLET | Freq: Every day | ORAL | 0 refills | Status: AC

## 2023-06-30 NOTE — ED Notes (Signed)
 Pt is currently sleeping, no distress noted, environmental check complete, will continue to monitor patient for safety.

## 2023-06-30 NOTE — ED Notes (Addendum)
 Discharge instructions reviewed w/ pt and. Medications, follow-up care and resources reviewed. Pt verbalized understanding. All belongings returned to pt. Pt A&Ox4, ambulatory w/ steady gait and VSS upon departure. Taxi voucher provided.

## 2023-06-30 NOTE — ED Notes (Signed)
 Pt sleeping at this time. Rise and fall of chest noted. Pt in NAD at this time. Will continue to monitor.

## 2023-06-30 NOTE — Discharge Instructions (Addendum)
 Discharge recommendations:   Medications: Please continue taking Haldol 5mg  and Benztropine 0.5mg , both every night at bedtime for hallucinations. Patient is to take medications as prescribed. The patient or patient's guardian is to contact a medical professional and/or outpatient provider to address any new side effects that develop. The patient or the patient's guardian should update outpatient providers of any new medications and/or medication changes.    Outpatient Follow up: Please return to [facility] for Open Access walk in services for outpatient psychiatry and therapy. This is located in the same building on the 2nd floor. PLEASE ARRIVE BY 7:15 am as walk ins are first come first serve. Please review list of outpatient resources for psychiatry and counseling. Please follow up with your primary care provider for all medical related needs.    Therapy: We recommend that patient participate in individual therapy to address mental health concerns.   Atypical antipsychotics: If you are prescribed an atypical antipsychotic, it is recommended that your height, weight, BMI, blood pressure, fasting lipid panel, and fasting blood sugar be monitored by your outpatient providers.  Safety:   The following safety precautions should be taken:   No sharp objects. This includes scissors, razors, scrapers, and putty knives.   Chemicals should be removed and locked up.   Medications should be removed and locked up.   Weapons should be removed and locked up. This includes firearms, knives and instruments that can be used to cause injury.   The patient should abstain from use of illicit substances/drugs and abuse of any medications.  If symptoms worsen or do not continue to improve or if the patient becomes actively suicidal or homicidal then it is recommended that the patient return to the closest hospital emergency department, the [facility], or call  911 for further evaluation and treatment. National Suicide Prevention Lifeline 1-800-SUICIDE or 612 243 9967.  About 988 988 offers 24/7 access to trained crisis counselors who can help people experiencing mental health-related distress. People can call or text 988 or chat 988lifeline.org for themselves or if they are worried about a loved one who may need crisis support.

## 2023-06-30 NOTE — ED Notes (Signed)
 Pt speaking w/ provider at this time

## 2023-06-30 NOTE — ED Provider Notes (Signed)
 [facility] ASAP Discharge Summary  Date and Time: 06/30/2023 9:54 AM  Name: Mike Harrison  MRN:  [id number]   Discharge Diagnoses:  Final diagnoses:  Auditory hallucination  Paranoia (HCC)    Subjective: Mike Harrison 63 y.o., male Mike Harrison presented to [facility] on 06/27/2023 as a voluntary walk in unaccompanied with complaints of paranoia and auditory hallucinations. He has PPH of cocaine use, MDD and GAD. He does have a counselor through [facility], no current psychiatrist. He has been prescribed Mirtazapine by his PCP.  [name], is seen face to face by this provider, consulted with Dr. Loleta Chance; and chart reviewed on 06/30/23.  On evaluation Mike Harrison reports "I am doing pretty good, just waiting to discharge from here. I know ya'll are still trying to contact the [facility] though and here is their number. I have not heard any voices since the day I came in. Im not feeling paranoid or anything like that either. My mood is pretty good. I think that new medication (Haldol) is helping." Pt denies having any side effects at this time. We discussed following up with outpatient mental health services for medication management and therapy, for which he agreed to. He reports feeling depressed at times when thinking about his wife that passed away but denies depressed mood at this time. He denies suicidal ideations, homicidal ideations and psychotic symptoms. Pt states that he is able to return to [facility], but is thinking about possibly going there to get his belongings then going back to his own home.   During evaluation Mike Harrison is lying in bed, in no acute distress.  He is alert & oriented x 4, calm, cooperative and attentive for this assessment.  His mood is euthymic with congruent affect.  He has normal speech, and behavior.  Objectively there is no evidence of psychosis/mania or delusional thinking. Pt does not appear to be responding to internal or external stimuli.   Mike Harrison is able to converse coherently, goal directed thoughts, no distractibility, or pre-occupation.  He also denies suicidal/self-harm/homicidal ideation, psychosis, and paranoia.  Mike Harrison answered assessment questions appropriately.    Stay Summary: 06/29/2023  [name], NP           Thought contents are organized and logical, and pt currently denies SI/HI/AVH or paranoia. There is no evidence of delusional thoughts. The Mike Harrison is currently stable for discharge, provided consent to contact his [facility] to see if he can return there.  Writer called 3 times, there was no answer, restarted on Haldol 5 mg nightly, as previously it was just 2 mg x 1 dose given yesterday.  Cogentin 0.5 mg nightly also started for EPS prophylaxis.  Provider tomorrow to reassess, and attempt calling [facility] again, and discharge Mike Harrison on 4/4 if Mike Harrison continues to maintain stability.  06/28/2023  [name], NP            "I'm doing okay. I have not heard any voices or see things this morning". Mike Harrison has been seen this morning, chart reviewed. The chart findings discussed with the treatment.  He reports, "I came in here yesterday because I was hearing voices & seeing things other people were unable to hear or see. These has been going on for a week. This started last Wednesday & had worsened by Sunday.  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 I do not know the trigger.  And because I was thinking that people  were plotting stuff against me, I even set my phone-up to catch the voices I was hearing so that others will believe me. I left the house after setting-up my phone. When I returned home, I took my phone to hear what was going on at my home when I was not there. I put om my ear piece, again I heard everything. Then, I called my family & my friends, played the voices that were recorded for them to hear. I told them that  I want them to hear what was going on when I was not in the  house. After listening, everybody started looking at me & told me they did not hear any voices. They told me that there were no voices recorded. That was when I started getting worried.  I have been on mirtazapine 15 mg for a long time. Since this has been going on, sometimes I will feel disoriented.  I did not use any drugs or drink alcohol.  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  Again I have never been hospitalized in a psychiatric hospital in the past. I'm doing okay. I have not heard any voices or see things this morning.  I have been thinking since being here, could this be related to the month of April.  A lot of good and bad things happened to me on the month of April.  I was born in April, married in April, and one of my family member was also born in April.  I am a widow, loved my wife very much.  But she died a while ago.  April month should have been our anniversary.  I been wondering if this could be what triggered all these.  I do not want to go to the hospital, I rather get better & discharge from here. Mike Harrison currently denies any SIHI, AVH, delusional thoughts or paranoia. He does not appear to be responding to any internal stimuli. Mike Harrison is in agreement to try a low dose of an antipsychotic medication for his symptoms.  06/27/2023   [name], NP                Face-to-face observation of Mike Harrison, Mike Harrison is alert and oriented x 4, speech is clear, maintain eye contact. Mike Harrison denies SI, HI, AVH. Reports auditory hallucination. According to the Mike Harrison he is a recovering cocaine addict and he has been in recovery for the past 2 years. Per the Mike Harrison he can return to the [facility]. Mike Harrison is asking for medication. Mike Harrison reports that he is currently prescribed mirtazapine by his provider. At this present moment Mike Harrison does not seem to be influenced by internal or external stimuli.  Mike Harrison denies alcohol use at this time denies any recent drug use. Reports he only smoked tobacco products. Mike Harrison reports that he currently is unemployed and he is trying to get disability but they denied it. Given Mike Harrison report of auditory hallucination. Recommend overnight observation and further evaluation in the morning.   Total Time spent with Mike Harrison: 30 minutes  Past Psychiatric History: Depression and anxiety Past Medical History: Osteoarthritis, HIV, Syphilis, need for left knee replacement  Family History: None reported  Family Psychiatric History: None reported Social History: Pt currently unemployed and living at the [employer]. He reports history of cocaine use 2 yrs ago, denies current use. His wife passed away from drug overdose in 2019.  He has 4 children- 3 adults and one 78 month old.  Tobacco Cessation:  N/A, Mike Harrison does not currently use tobacco products  Current Medications:  Current Facility-Administered Medications  Medication Dose Route Frequency Provider Last Rate Last Admin   acetaminophen (TYLENOL) tablet 650 mg  650 mg Oral Q6H PRN [name], NP       alum & mag hydroxide-simeth (MAALOX/MYLANTA) 200-200-20 MG/5ML suspension 30 mL  30 mL Oral Q4H PRN [name], NP       atorvastatin (LIPITOR) tablet 20 mg  20 mg Oral Daily [name], NP   20 mg at 06/30/23 1610   benztropine (COGENTIN) tablet 0.5 mg  0.5 mg Oral QHS [name], NP   0.5 mg at 06/29/23 2139   bictegravir-emtricitabine-tenofovir AF (BIKTARVY) 50-200-25 MG per tablet 1 tablet  1 tablet Oral Daily [name], NP   1 tablet at 06/30/23 9604   cholecalciferol (VITAMIN D3) 25 MCG (1000 UNIT) tablet 2,000 Units  2,000 Units Oral Lestine Mount, NP   2,000 Units at 06/30/23 5409   haloperidol (HALDOL) tablet 5 mg  5 mg Oral QHS [name], NP   5 mg at 06/29/23 2139   magnesium hydroxide (MILK OF MAGNESIA) suspension 30 mL  30 mL Oral Daily PRN [name], NP        mirtazapine (REMERON) tablet 30 mg  30 mg Oral QHS [name], NP   30 mg at 06/29/23 2139   OLANZapine (ZYPREXA) injection 10 mg  10 mg Intramuscular TID PRN [name], NP       OLANZapine (ZYPREXA) injection 5 mg  5 mg Intramuscular TID PRN [name], NP       OLANZapine zydis (ZYPREXA) disintegrating tablet 5 mg  5 mg Oral TID PRN [name], NP       terbinafine (LAMISIL) tablet 250 mg  250 mg Oral Daily [name], NP   250 mg at 06/30/23 8119   Current Outpatient Medications  Medication Sig Dispense Refill   atorvastatin (LIPITOR) 20 MG tablet Take 1 tablet (20 mg total) by mouth daily. 30 tablet 11   bictegravir-emtricitabine-tenofovir AF (BIKTARVY) 50-200-25 MG TABS tablet Take one tablet by moth every day 30 tablet 11   HYDROcodone-acetaminophen (NORCO/VICODIN) 5-325 MG tablet Take 1 tablet by mouth every 6 (six) hours as needed for moderate pain (pain score 4-6). 15 tablet 0   mirtazapine (REMERON) 15 MG tablet Take 2 tablets (30 mg total) by mouth at bedtime. 30 tablet 11   terbinafine (LAMISIL) 250 MG tablet Take 250 mg by mouth daily.     Vitamin D, Cholecalciferol, 25 MCG (1000 UT) TABS Take 2,000 Units by mouth every other day.     albuterol (VENTOLIN HFA) 108 (90 Base) MCG/ACT inhaler Inhale 2 puffs into the lungs every 6 (six) hours as needed for wheezing or shortness of breath. (Mike Harrison not taking: Reported on 06/28/2023)     sildenafil (VIAGRA) 50 MG tablet Take one tablet (50 mg) one hour before sex (Mike Harrison not taking: Reported on 06/28/2023) 20 tablet 3    PTA Medications:  Facility Ordered Medications  Medication   acetaminophen (TYLENOL) tablet 650 mg   alum & mag hydroxide-simeth (MAALOX/MYLANTA) 200-200-20 MG/5ML suspension 30 mL   magnesium hydroxide (MILK OF MAGNESIA) suspension 30 mL   OLANZapine zydis (ZYPREXA) disintegrating tablet 5 mg   OLANZapine (ZYPREXA) injection 5 mg   OLANZapine (ZYPREXA) injection 10 mg   atorvastatin (LIPITOR) tablet 20  mg   bictegravir-emtricitabine-tenofovir AF (  BIKTARVY) 50-200-25 MG per tablet 1 tablet   mirtazapine (REMERON) tablet 30 mg   terbinafine (LAMISIL) tablet 250 mg   cholecalciferol (VITAMIN D3) 25 MCG (1000 UNIT) tablet 2,000 Units   [COMPLETED] haloperidol (HALDOL) tablet 2 mg   haloperidol (HALDOL) tablet 5 mg   benztropine (COGENTIN) tablet 0.5 mg   PTA Medications  Medication Sig   Vitamin D, Cholecalciferol, 25 MCG (1000 UT) TABS Take 2,000 Units by mouth every other day.   terbinafine (LAMISIL) 250 MG tablet Take 250 mg by mouth daily.   atorvastatin (LIPITOR) 20 MG tablet Take 1 tablet (20 mg total) by mouth daily.   bictegravir-emtricitabine-tenofovir AF (BIKTARVY) 50-200-25 MG TABS tablet Take one tablet by moth every day   mirtazapine (REMERON) 15 MG tablet Take 2 tablets (30 mg total) by mouth at bedtime.   HYDROcodone-acetaminophen (NORCO/VICODIN) 5-325 MG tablet Take 1 tablet by mouth every 6 (six) hours as needed for moderate pain (pain score 4-6).   sildenafil (VIAGRA) 50 MG tablet Take one tablet (50 mg) one hour before sex (Mike Harrison not taking: Reported on 06/28/2023)   albuterol (VENTOLIN HFA) 108 (90 Base) MCG/ACT inhaler Inhale 2 puffs into the lungs every 6 (six) hours as needed for wheezing or shortness of breath. (Mike Harrison not taking: Reported on 06/28/2023)       05/30/2023    9:03 AM 11/22/2022   11:33 AM 05/09/2022   11:09 AM  Depression screen PHQ 2/9  Decreased Interest 2 0 2  Down, Depressed, Hopeless 2 0 2  PHQ - 2 Score 4 0 4  Altered sleeping 2    Tired, decreased energy 2    Change in appetite 2    Feeling bad or failure about yourself  3    Trouble concentrating 3    Moving slowly or fidgety/restless 2    Suicidal thoughts 1    PHQ-9 Score 19    Difficult doing work/chores Very difficult      Flowsheet Row ED from 06/27/2023 in [facility] Office Visit from 05/30/2023 in [facility] [address]. [facility] ED from 03/19/2023 in [facility] Emergency Department at [facility]  C-SSRS RISK CATEGORY No Risk Error: Q3, 4, or 5 should not be populated when Q2 is No No Risk       Musculoskeletal  Strength & Muscle Tone: within normal limits Gait & Station: normal Mike Harrison leans: N/A  Psychiatric Specialty Exam  Presentation  General Appearance:  Appropriate for Environment  Eye Contact: Good  Speech: Clear and Coherent  Speech Volume: Normal  Handedness: Right   Mood and Affect  Mood: Euthymic  Affect: Appropriate; Congruent   Thought Process  Thought Processes: Coherent; Linear  Descriptions of Associations:Intact  Orientation:Full (Time, Place and Person)  Thought Content:WDL  Diagnosis of Schizophrenia or Schizoaffective disorder in past: No  Duration of Psychotic Symptoms: Less than six months   Hallucinations:Hallucinations: None  Ideas of Reference:None  Suicidal Thoughts:Suicidal Thoughts: No  Homicidal Thoughts:Homicidal Thoughts: No   Sensorium  Memory: Immediate Good; Recent Good  Judgment: Good  Insight: Good   Executive Functions  Concentration: Good  Attention Span: Good  Recall: Good  Fund of Knowledge: Good  Language: Good   Psychomotor Activity  Psychomotor Activity: Psychomotor Activity: Normal   Assets  Assets: Desire for Improvement; Housing; Physical Health; Social Support; Manufacturing systems engineer; Financial Resources/Insurance; Resilience   Sleep  Sleep: Sleep: Good Number of Hours  of Sleep: 8   No data recorded  Physical Exam  Physical Exam Vitals and nursing note reviewed.  Constitutional:      Appearance: Normal appearance.  HENT:     Head: Normocephalic.     Nose: Nose normal.  Eyes:     Extraocular Movements: Extraocular movements intact.  Cardiovascular:     Rate and Rhythm: Normal rate.  Pulmonary:     Effort: Pulmonary effort is normal.  Musculoskeletal:         General: Normal range of motion.     Cervical back: Normal range of motion.  Neurological:     General: No focal deficit present.     Mental Status: He is alert and oriented to person, place, and time.    Review of Systems  Constitutional: Negative.   HENT: Negative.    Eyes: Negative.   Respiratory: Negative.    Cardiovascular: Negative.   Gastrointestinal: Negative.   Genitourinary: Negative.   Musculoskeletal: Negative.   Neurological: Negative.   Endo/Heme/Allergies: Negative.   Psychiatric/Behavioral: Negative.     Blood pressure 134/86, pulse 72, temperature 97.8 F (36.6 C), temperature source Oral, resp. rate 17, SpO2 98%. There is no height or weight on file to calculate BMI.  Demographic Factors:  Male, Divorced or widowed, and Unemployed  Loss Factors: NA  Historical Factors: NA  Risk Reduction Factors:   Responsible for children under 70 years of age, Sense of responsibility to family, Living with another person, especially a relative, Positive social support, and Positive therapeutic relationship  Continued Clinical Symptoms:  Depression:   Delusional  Cognitive Features That Contribute To Risk:  None    Suicide Risk:  Minimal: No identifiable suicidal ideation.  Patients presenting with no risk factors but with morbid ruminations; may be classified as minimal risk based on the severity of the depressive symptoms  Plan Of Care/Follow-up recommendations:  Pt is discharged from [facility] with plan to return to [facility].   He agrees to follow up with outpatient mental health services for medication management and therapy at [facility]. Resources provided.   Pt is provided with a 1 week supply of Haldol 5mg  at bedtime and Cogentin 0.5mg  at bedtime to bridge him through until follow up with outpatient.   Disposition: Discharged with outpatient resources.   Howie Ill, NP 06/30/2023, 9:54 AM

## 2023-06-30 NOTE — ED Notes (Signed)
 Still waiting on taxi

## 2023-06-30 NOTE — ED Notes (Signed)
 Pt A&Ox4, denies need of anything at this time. NAD. Denies SI/HI/AVH. Will continue to monitor.

## 2023-07-04 ENCOUNTER — Emergency Department (HOSPITAL_COMMUNITY)
Admission: EM | Admit: 2023-07-04 | Discharge: 2023-07-04 | Disposition: A | Discharge status: Home / Self Care | Attending: Emergency Medicine | Admitting: Emergency Medicine

## 2023-07-04 ENCOUNTER — Other Ambulatory Visit: Payer: Self-pay

## 2023-07-04 ENCOUNTER — Emergency Department (HOSPITAL_COMMUNITY)

## 2023-07-04 ENCOUNTER — Encounter (HOSPITAL_COMMUNITY): Payer: Self-pay

## 2023-07-04 DIAGNOSIS — Z21 Asymptomatic human immunodeficiency virus [HIV] infection status: Secondary | ICD-10-CM | POA: Diagnosis not present

## 2023-07-04 DIAGNOSIS — M25562 Pain in left knee: Secondary | ICD-10-CM | POA: Insufficient documentation

## 2023-07-04 DIAGNOSIS — I10 Essential (primary) hypertension: Secondary | ICD-10-CM | POA: Insufficient documentation

## 2023-07-04 LAB — CBC WITH DIFFERENTIAL/PLATELET
Abs Immature Granulocytes: 0.04 10*3/uL (ref 0.00–0.07)
Basophils Absolute: 0 10*3/uL (ref 0.0–0.1)
Basophils Relative: 0 %
Eosinophils Absolute: 0.2 10*3/uL (ref 0.0–0.5)
Eosinophils Relative: 1 %
HCT: 44.8 % (ref 39.0–52.0)
Hemoglobin: 15.1 g/dL (ref 13.0–17.0)
Immature Granulocytes: 0 %
Lymphocytes Relative: 22 %
Lymphs Abs: 2.5 10*3/uL (ref 0.7–4.0)
MCH: 32.1 pg (ref 26.0–34.0)
MCHC: 33.7 g/dL (ref 30.0–36.0)
MCV: 95.3 fL (ref 80.0–100.0)
Monocytes Absolute: 1.2 10*3/uL — ABNORMAL HIGH (ref 0.1–1.0)
Monocytes Relative: 11 %
Neutro Abs: 7.2 10*3/uL (ref 1.7–7.7)
Neutrophils Relative %: 66 %
Platelets: 295 10*3/uL (ref 150–400)
RBC: 4.7 MIL/uL (ref 4.22–5.81)
RDW: 12.9 % (ref 11.5–15.5)
WBC: 11.1 10*3/uL — ABNORMAL HIGH (ref 4.0–10.5)
nRBC: 0 % (ref 0.0–0.2)

## 2023-07-04 LAB — BASIC METABOLIC PANEL WITH GFR
Anion gap: 9 (ref 5–15)
BUN: 21 mg/dL — ABNORMAL HIGH (ref 6–20)
CO2: 26 mmol/L (ref 22–32)
Calcium: 9 mg/dL (ref 8.9–10.3)
Chloride: 105 mmol/L (ref 98–111)
Creatinine, Ser: 1.18 mg/dL (ref 0.61–1.24)
GFR, Estimated: >60 mL/min (ref >60)
Glucose, Bld: 88 mg/dL (ref 70–99)
Potassium: 3.7 mmol/L (ref 3.5–5.1)
Sodium: 140 mmol/L (ref 135–145)

## 2023-07-04 MED ORDER — OXYCODONE HCL 5 MG PO TABS
5.0000 mg | ORAL_TABLET | Freq: Once | ORAL | Status: AC
  Administered 2023-07-04: 5 mg via ORAL
  Filled 2023-07-04: qty 1

## 2023-07-04 MED ORDER — CEPHALEXIN 500 MG PO CAPS: 500.0000 mg | ORAL_CAPSULE | Freq: Four times a day (QID) | ORAL | 0 refills | Status: AC

## 2023-07-04 MED ORDER — NAPROXEN 500 MG PO TABS: 500.0000 mg | ORAL_TABLET | Freq: Two times a day (BID) | ORAL | 0 refills | Status: AC

## 2023-07-04 MED ORDER — KETOROLAC TROMETHAMINE 15 MG/ML IJ SOLN
15.0000 mg | Freq: Once | INTRAMUSCULAR | Status: AC
  Administered 2023-07-04: 15 mg via INTRAMUSCULAR
  Filled 2023-07-04: qty 1

## 2023-07-04 MED ORDER — OXYCODONE HCL 5 MG PO TABS: 5.0000 mg | ORAL_TABLET | ORAL | 0 refills | Status: AC | PRN

## 2023-07-04 MED ORDER — DOXYCYCLINE HYCLATE 100 MG PO CAPS: 100.0000 mg | ORAL_CAPSULE | Freq: Two times a day (BID) | ORAL | 0 refills | Status: AC

## 2023-07-04 NOTE — Discharge Instructions (Addendum)
 Thank you for letting us evaluate you today.  Your labs showed a small elevation in your white blood cell count. XR showed arthritis but is not the best test for infection/fluid. We will treat you for a possible infection due to being so tender until you are able to follow up with your ortho surgeon. I have also sent pain medicine to [facility] on [address]. You can pick both up tomorrow as we gave you two pain medicines and antiinflammatories here in ED. Do not take naprosyn until tomorrow as we gave you a dose here in ED  Please make sure to follow up with them

## 2023-07-04 NOTE — ED Triage Notes (Signed)
 Arrived reporting L knee pain, atraumatic. Hx of arthritis. Has been seeing ortho. Patient gets cortisone injections. New swelling to knee. Patient states no relief. Appt next week, possible knee replacement needed.

## 2023-07-04 NOTE — ED Provider Notes (Signed)
 [facility] EMERGENCY DEPARTMENT AT [facility] Provider Note   CSN: [id number] Arrival date & time: 07/04/23  1423     History  Chief Complaint  Patient presents with   Knee Pain    Mike Harrison is a 63 y.o. male with past medical history of HTN, HLD, HIV (undetectable on Biktarvy), cocaine use, OA and meniscal tear of left knee presents emergency department for evaluation of intermittent left knee pain and swelling since January of this year.  He has tried naproxen, tramadol, voltaren gel, hydrocodone for pain management with some relief.    Was recently seen by Dr. Howard Macho at [facility] sport medicine on 06/01/23 for knee pain and swelling. He ruled out reactive arthritis with negative GC/Cl and RPR. MRI was significant for severely degenerated and torn medial meniscus, tricompartmental degenerative changes, small joint effusion. He was recommended to f/u with ortho surgery  Next appointment is on 07/17/2023 with Ortho surgeon for possible TKA.  He is here today for pain management until then. He reports his level of pain and swelling has been at its worst for last three weeks. No concern for STD. Denies fevers, chills, IVDU, recent drug use  The history is provided by the patient.  Knee Pain Associated symptoms: no fatigue and no fever       Home Medications Prior to Admission medications   Medication Sig Start Date End Date Taking? Authorizing Provider  cephALEXin (KEFLEX) 500 MG capsule Take 1 capsule (500 mg total) by mouth 4 (four) times daily for 10 days. 07/04/23 07/14/23 Yes Royann Cords, PA  doxycycline (VIBRAMYCIN) 100 MG capsule Take 1 capsule (100 mg total) by mouth 2 (two) times daily. 07/04/23  Yes Royann Cords, PA  naproxen (NAPROSYN) 500 MG tablet Take 1 tablet (500 mg total) by mouth 2 (two) times daily. 07/04/23  Yes Royann Cords, PA  oxyCODONE (ROXICODONE) 5 MG immediate release tablet Take 1 tablet (5 mg total) by mouth every 4 (four) hours as needed for up to  5 days for severe pain (pain score 7-10). 07/04/23 07/09/23 Yes Royann Cords, PA  albuterol (VENTOLIN HFA) 108 (90 Base) MCG/ACT inhaler Inhale 2 puffs into the lungs every 6 (six) hours as needed for wheezing or shortness of breath. Patient not taking: Reported on 06/28/2023    [provider]  atorvastatin (LIPITOR) 20 MG tablet Take 1 tablet (20 mg total) by mouth daily. 05/31/23   Charolette Copier, MD  benztropine (COGENTIN) 0.5 MG tablet Take 1 tablet (0.5 mg total) by mouth at bedtime. 06/30/23   Brent, Amanda C, NP  bictegravir-emtricitabine-tenofovir AF (BIKTARVY) 50-200-25 MG TABS tablet Take one tablet by moth every day 05/31/23   [place], Jerelyn Money, MD  haloperidol (HALDOL) 5 MG tablet Take 1 tablet (5 mg total) by mouth at bedtime. 06/30/23   Brent, Amanda C, NP  HYDROcodone-acetaminophen (NORCO/VICODIN) 5-325 MG tablet Take 1 tablet by mouth every 6 (six) hours as needed for moderate pain (pain score 4-6). 06/14/23   Hudnall, Fabienne Holter, MD  mirtazapine (REMERON) 15 MG tablet Take 2 tablets (30 mg total) by mouth at bedtime. 05/31/23   Charolette Copier, MD  sildenafil (VIAGRA) 50 MG tablet Take one tablet (50 mg) one hour before sex Patient not taking: Reported on 06/28/2023 06/14/23   Ernie Heal, Jerelyn Money, MD  terbinafine (LAMISIL) 250 MG tablet Take 250 mg by mouth daily. 05/18/23   [provider]  Vitamin D, Cholecalciferol, 25  MCG (1000 UT) TABS Take 2,000 Units by mouth every other day.    [provider]      Allergies    Hydrochlorothiazide, Lisinopril, and Bactrim [sulfamethoxazole-trimethoprim]    Review of Systems   Review of Systems  Constitutional:  Negative for chills, fatigue and fever.  Respiratory:  Negative for cough, chest tightness, shortness of breath and wheezing.   Cardiovascular:  Negative for chest pain and palpitations.  Gastrointestinal:  Negative for abdominal pain, constipation, diarrhea, nausea and vomiting.  Musculoskeletal:   Positive for arthralgias and joint swelling.  Neurological:  Negative for dizziness, seizures, weakness, light-headedness, numbness and headaches.    Physical Exam Updated Vital Signs BP 135/77 (BP Location: Left Arm)   Pulse 77   Temp 97.9 F (36.6 C) (Oral)   Resp 18   Ht 5\' 11"  (1.803 m)   Wt 90.7 kg   SpO2 99%   BMI 27.89 kg/m  Physical Exam Vitals and nursing note reviewed.  Constitutional:      General: He is not in acute distress.    Appearance: Normal appearance.  HENT:     Head: Normocephalic and atraumatic.  Eyes:     Conjunctiva/sclera: Conjunctivae normal.  Cardiovascular:     Rate and Rhythm: Normal rate.     Pulses:          Dorsalis pedis pulses are 2+ on the right side and 2+ on the left side.  Pulmonary:     Effort: Pulmonary effort is normal. No respiratory distress.  Musculoskeletal:     Right lower leg: No edema.     Left lower leg: No edema.     Comments: Mild swelling and TTP of anterior and medial aspect of left knee with light palpation. Mild warmth but no gross swelling nor erythema. Able to fully actively and passively flex and extend knee.  Pain with WB of left knee. Walks with limp 2/2 to pain. No warmth, erythema to knees bilaterally. Sensation 2/2 of LLE  Skin:    Capillary Refill: Capillary refill takes less than 2 seconds.     Coloration: Skin is not jaundiced or pale.  Neurological:     Mental Status: He is alert and oriented to person, place, and time. Mental status is at baseline.     ED Results / Procedures / Treatments   Labs (all labs ordered are listed, but only abnormal results are displayed) Labs Reviewed  CBC WITH DIFFERENTIAL/PLATELET - Abnormal; Notable for the following components:      Result Value   WBC 11.1 (*)    Monocytes Absolute 1.2 (*)    All other components within normal limits  BASIC METABOLIC PANEL WITH GFR - Abnormal; Notable for the following components:   BUN 21 (*)    All other components within normal  limits    EKG None  Radiology DG Knee Complete 4 Views Left Result Date: 07/04/2023 CLINICAL DATA:  Left knee pain, tender to palpation. Atraumatic pain. EXAM: LEFT KNEE - COMPLETE 4+ VIEW COMPARISON:  Knee MRI 06/04/2023 FINDINGS: Medial tibiofemoral joint space narrowing. Moderate tricompartmental peripheral spurring and spurring of the tibial spines. Subchondral cyst in the medial femoral condyle which was better assessed on prior MRI. No fracture or erosive change. No joint effusion. IMPRESSION: Moderate tricompartmental osteoarthritis. Electronically Signed   By: Chadwick Colonel M.D.   On: 07/04/2023 19:07    Procedures Procedures    Medications Ordered in ED Medications  ketorolac (TORADOL) 15 MG/ML injection 15 mg (15  mg Intramuscular Given 07/04/23 1728)  oxyCODONE (Oxy IR/ROXICODONE) immediate release tablet 5 mg (5 mg Oral Given 07/04/23 1918)    ED Course/ Medical Decision Making/ A&P                                 Medical Decision Making Amount and/or Complexity of Data Reviewed Labs: ordered. Radiology: ordered.  Risk Prescription drug management.   Patient presents to the ED for concern of left knee pain, this involves an extensive number of treatment options, and is a complaint that carries with it a high risk of complications and morbidity.  The differential diagnosis includes OA, septic joint, reactive arthritis   Co morbidities that complicate the patient evaluation  PMHx of left knee OA, meniscal tear   Additional history obtained:  Additional history obtained from Nursing and Outside Medical Records   External records from outside source obtained and reviewed including triage RN note, sports medicine note from 06/01/2023, left knee MRI results, lab work from 06/01/2023   Lab Tests:  I Ordered, and personally interpreted labs.  The pertinent results include:   WBC 11.1   Imaging Studies ordered:  I ordered imaging studies including left knee XR  I  independently visualized and interpreted imaging which showed Moderate tricompartmental osteoarthritis  I agree with the radiologist interpretation     Medicines ordered and prescription drug management:  I ordered medication including toradol  for pain  Reevaluation of the patient after these medicines showed that the patient improved I have reviewed the patients home medicines and have made adjustments as needed     Problem List / ED Course:  Left knee pain Mild leuko at 11.1. Low suspicion for septic joint at this time as he does not have gross swelling, warmth, erythema to entire knee, no reduced ROM. Pain is localized to bursa. XR negative for effusion Well perfused extremity low suspicion for limb ischemia, compartment syndrome No swelling nor TTP to lower leg, posterior leg so low suspicion for DVT Will cover for septic bursitis as he has significant pain over prepatellar bursa. He is to f/u with ortho surgeon as scheduled for knee replacement consult   Reevaluation:  After the interventions noted above, I reevaluated the patient and found that they have :improved    Dispostion:  After consideration of the diagnostic results and the patients response to treatment, I feel that the patent would benefit from outpatient management with ortho surgery f/u as scheduled.   Discussed ED workup, disposition, return to ED precautions with patient who expresses understanding agrees with plan.  All questions answered to their satisfaction.  They are agreeable to plan.  Discharge instructions provided on paperwork  Dr. Adrain Alar individually assessed patient and agrees with plan Final Clinical Impression(s) / ED Diagnoses Final diagnoses:  Left knee pain, unspecified chronicity    Rx / DC Orders ED Discharge Orders          Ordered    cephALEXin (KEFLEX) 500 MG capsule  4 times daily        07/04/23 1913    doxycycline (VIBRAMYCIN) 100 MG capsule  2 times daily         07/04/23 1913    oxyCODONE (ROXICODONE) 5 MG immediate release tablet  Every 4 hours PRN        07/04/23 1913    naproxen (NAPROSYN) 500 MG tablet  2 times daily  07/04/23 1921              Royann Cords, PA 07/05/23 8119    Rosealee Concha, MD 07/10/23 [phone]

## 2023-07-05 ENCOUNTER — Telehealth: Payer: Self-pay

## 2023-07-05 NOTE — Telephone Encounter (Addendum)
 Received message from [facility] CMA who reports Mike Harrison in [place] waiting room reports patient needs taxi voucher. Per chart review patient was seen and discharged from [facility] for for knee pain. This RNCM spoke with patient outside of [place] waiting room. Patient reports he was living at [facility] however no longer there. Patient reports his daughter will try to come get him over the weekend. Patient reports he is from [place] and plans to have knee surgery on 07/17/23. Patient reports noone gave him transportation at discharge.  Patient reports he was recently at [facility]. Patient reports he is unaware of what the [facility] is this RNCM advised of available services via [facility]. Patient requesting dc medications be sent to our out pt pharmacy. Per chart review patient was prescribed a narcotic and this RNCM unable to provide a verbal prescription. Per chart review prescribing MD/PA are not on shift therefore unable to send to [facility] Out[facility] pharmacy. This RNCM notified patient. Patient verbalized understanding.   This RNCM provided patient with 2 bus passes and the following resources: shelter, transportation and social services.   No additional TOC needs.

## 2023-07-11 ENCOUNTER — Ambulatory Visit: Admitting: Licensed Clinical Social Worker

## 2023-07-11 ENCOUNTER — Ambulatory Visit

## 2023-07-12 ENCOUNTER — Ambulatory Visit: Admitting: Orthopedic Surgery

## 2023-07-17 ENCOUNTER — Telehealth: Payer: Self-pay | Admitting: Infectious Disease

## 2023-07-17 NOTE — Telephone Encounter (Signed)
 I called to confirm next day appointments and Mike Harrison requested to cancel his scheduled appointment. He had some issues with his mental health and housing situation and has relocated to [place], [place] with his daughter. Emile wanted to make sure Dr. Ernie Heal and Caitlyn were aware he has transferred his care and is thankful for their help along the way.

## 2023-07-18 ENCOUNTER — Ambulatory Visit: Payer: Self-pay | Admitting: Infectious Disease

## 2023-08-15 ENCOUNTER — Telehealth: Payer: Self-pay

## 2023-08-15 NOTE — Telephone Encounter (Signed)
 Patient called, has been unable to get medication for almost 2 weeks. Called his new clinic in [place] and they recommended he call here for refills.   Discussed that he has plenty of refills on file, but that since he has [place] Medicaid he would need to fill his prescription in [place]. He says he cannot do that and will call Medicaid office in [place]  to try and start that process.   Shresta Risden, BSN, RN

## 2023-11-20 ENCOUNTER — Other Ambulatory Visit (HOSPITAL_COMMUNITY): Payer: Self-pay

## 2024-01-29 ENCOUNTER — Encounter: Payer: Self-pay | Admitting: Radiology
